# Patient Record
Sex: Male | Born: 1960
Health system: Southern US, Community
[De-identification: ages and names within clinical notes are randomized; demographics above are authoritative.]

## PROBLEM LIST (undated history)

## (undated) DIAGNOSIS — G4730 Sleep apnea, unspecified: Secondary | ICD-10-CM

## (undated) DIAGNOSIS — F32A Depression, unspecified: Secondary | ICD-10-CM

## (undated) DIAGNOSIS — Z973 Presence of spectacles and contact lenses: Secondary | ICD-10-CM

## (undated) DIAGNOSIS — B955 Unspecified streptococcus as the cause of diseases classified elsewhere: Secondary | ICD-10-CM

## (undated) DIAGNOSIS — I10 Essential (primary) hypertension: Secondary | ICD-10-CM

## (undated) DIAGNOSIS — K219 Gastro-esophageal reflux disease without esophagitis: Secondary | ICD-10-CM

## (undated) DIAGNOSIS — T7840XA Allergy, unspecified, initial encounter: Secondary | ICD-10-CM

## (undated) DIAGNOSIS — E119 Type 2 diabetes mellitus without complications: Secondary | ICD-10-CM

## (undated) DIAGNOSIS — J45909 Unspecified asthma, uncomplicated: Secondary | ICD-10-CM

## (undated) DIAGNOSIS — IMO0002 Reserved for concepts with insufficient information to code with codable children: Secondary | ICD-10-CM

## (undated) DIAGNOSIS — E785 Hyperlipidemia, unspecified: Secondary | ICD-10-CM

## (undated) DIAGNOSIS — F329 Major depressive disorder, single episode, unspecified: Secondary | ICD-10-CM

## (undated) DIAGNOSIS — L0390 Cellulitis, unspecified: Secondary | ICD-10-CM

## (undated) HISTORY — PX: HERNIA REPAIR: SHX51

## (undated) HISTORY — PX: COLONOSCOPY: SHX174

## (undated) HISTORY — PX: APPENDECTOMY: SHX54

## (undated) HISTORY — PX: VASECTOMY: SHX75

## (undated) HISTORY — PX: ANKLE SURGERY: SHX546

## (undated) HISTORY — PX: SPINE SURGERY: SHX786

## (undated) HISTORY — DX: Unspecified asthma, uncomplicated: J45.909

## (undated) HISTORY — DX: Allergy, unspecified, initial encounter: T78.40XA

---

## 2004-04-15 ENCOUNTER — Ambulatory Visit: Payer: Self-pay | Admitting: Internal Medicine

## 2005-04-23 ENCOUNTER — Ambulatory Visit: Payer: Self-pay | Admitting: Internal Medicine

## 2005-08-14 ENCOUNTER — Ambulatory Visit: Payer: Self-pay | Admitting: Internal Medicine

## 2005-08-19 ENCOUNTER — Ambulatory Visit: Payer: Self-pay | Admitting: Internal Medicine

## 2006-04-01 ENCOUNTER — Encounter: Payer: Self-pay | Admitting: Orthopedic Surgery

## 2006-04-01 ENCOUNTER — Ambulatory Visit: Payer: Self-pay | Admitting: Orthopedic Surgery

## 2006-04-15 ENCOUNTER — Encounter: Payer: Self-pay | Admitting: Orthopedic Surgery

## 2006-05-11 ENCOUNTER — Ambulatory Visit (HOSPITAL_BASED_OUTPATIENT_CLINIC_OR_DEPARTMENT_OTHER): Admission: RE | Admit: 2006-05-11 | Discharge: 2006-05-11 | Payer: Self-pay | Admitting: Orthopedic Surgery

## 2006-05-15 ENCOUNTER — Encounter: Payer: Self-pay | Admitting: Orthopedic Surgery

## 2006-06-15 ENCOUNTER — Encounter: Payer: Self-pay | Admitting: Orthopedic Surgery

## 2006-07-16 ENCOUNTER — Encounter: Payer: Self-pay | Admitting: Orthopedic Surgery

## 2007-07-03 ENCOUNTER — Ambulatory Visit: Payer: Self-pay | Admitting: Internal Medicine

## 2007-07-18 ENCOUNTER — Ambulatory Visit: Payer: Self-pay | Admitting: Internal Medicine

## 2008-11-30 ENCOUNTER — Emergency Department (HOSPITAL_COMMUNITY): Admission: EM | Admit: 2008-11-30 | Discharge: 2008-11-30 | Payer: Self-pay | Admitting: Emergency Medicine

## 2010-09-22 LAB — COMPREHENSIVE METABOLIC PANEL
ALT: 38 U/L (ref 0–53)
AST: 37 U/L (ref 0–37)
Albumin: 3.9 g/dL (ref 3.5–5.2)
Alkaline Phosphatase: 68 U/L (ref 39–117)
BUN: 10 mg/dL (ref 6–23)
Chloride: 106 mEq/L (ref 96–112)
GFR calc Af Amer: >60 mL/min (ref >60)
Potassium: 3.8 mEq/L (ref 3.5–5.1)
Sodium: 139 mEq/L (ref 135–145)
Total Bilirubin: 0.6 mg/dL (ref 0.3–1.2)

## 2010-09-22 LAB — URINALYSIS, ROUTINE W REFLEX MICROSCOPIC
Bilirubin Urine: NEGATIVE
Glucose, UA: NEGATIVE mg/dL
Hgb urine dipstick: NEGATIVE
Protein, ur: NEGATIVE mg/dL
Urobilinogen, UA: 0.2 mg/dL (ref 0.0–1.0)

## 2010-09-22 LAB — DIFFERENTIAL
Basophils Absolute: 0 10*3/uL (ref 0.0–0.1)
Basophils Relative: 1 % (ref 0–1)
Eosinophils Relative: 2 % (ref 0–5)
Monocytes Absolute: 0.5 10*3/uL (ref 0.1–1.0)
Monocytes Relative: 9 % (ref 3–12)

## 2010-09-22 LAB — CBC
HCT: 44 % (ref 39.0–52.0)
Platelets: 150 10*3/uL (ref 150–400)
WBC: 6.2 10*3/uL (ref 4.0–10.5)

## 2010-10-31 NOTE — Op Note (Signed)
Jeffrey [name], Jeffrey [name]               ACCOUNT NO.:  [id number]   MEDICAL RECORD NO.:  [id number]          PATIENT TYPE:  AMB   LOCATION:  DSC                          FACILITY:  [facility]   PHYSICIAN:  Leonides Grills, M.D.     DATE OF BIRTH:  11-15-1960   DATE OF PROCEDURE:  05/11/2006  DATE OF DISCHARGE:                               OPERATIVE REPORT   PREOPERATIVE DIAGNOSES:  1. Left medial talar dome osteochondral lesion.  2. Left anterior ankle impingement.   POSTOPERATIVE DIAGNOSES:  1. Left medial talar dome osteochondral lesion.  2. Left anterior ankle impingement.   OPERATION:  1. Left debridement drilling medial talar dome osteochondral lesion.  2. Left ankle arthroscopy with extensive debridement.   ANESTHESIA:  General.   SURGEON:  Leonides Grills, M.D.   ASSISTANT:  Evlyn Kanner, P.A.-C.   ESTIMATED BLOOD LOSS:  Minimal.   TOURNIQUET TIME:  Approximately an hour.   COMPLICATIONS:  None.   DISPOSITION:  Stable to PR.   INDICATIONS:  This is a 50 year old male who has had longstanding  anterior ankle pain after an ankle sprain that was interfering with  his life.  It has plateaued and has actually gotten worse to the point  he could not do the things he enjoys to do.  He was consented to the  above procedure.  All risks which include infection, nerve or vessel  injury, persistent pain, worse pain, stiffness, arthritis, sinus  formation were all explained.  Questions encouraged and answered.   DESCRIPTION OF PROCEDURE:  The patient was brought to the operating room  and placed in the supine position initially.  After adequate general  endotracheal anesthesia was administered as well as block as well as  Ancef 1 gram IV piggyback, the patient was then placed a sloppy lateral  position with the opposite side up on a beanbag.  All bony prominences  were well padded.  The left lower extremity was prepped and draped in a  sterile manner over a proximally placed thigh  tourniquet.  Limb was  gravity saline.  Tourniquet was elevated to 290 mmHg.  Anatomical  landmarks to include anterior tibialis tendon and peroneus tertius were  then mapped out.  Superficial peroneal nerve could not be seen.  Spinal  needle was then placed in the ankle and 20 mL normal saline was  instilled in the ankle.  Jeffrey [name] and spread technique was then utilized to  create the anteromedial portal medial through the tibialis tendon.  Blunt trocar with cannula followed by camera was placed into the ankle  under direct visualization anterolateral portals created just lateral to  peroneus tertius tendon illuminating the skin to make sure that the  superficial peroneal nerve was not injured and again this was done with  the nick and spread technique.  There was a tremendous amount of scar  tissue and synovitis over the entire anterior aspect of the ankle with  the anterior distal tibial spur as well.  An extensive debridement was  then performed with radiofrequency bevel 2.9 mm shaver as well as a 2.9  mm bur.  This included the accessory tib-fib ligament that was impinging  the anterolateral corner of the talar dome with local synovitis extended  into the anterolateral gutter.  At the anteromedial aspect of the ankle,  there was a large spur off the anterior aspect of the medial malleolus.  There was also a cartilage lesion involving the tibial plafond shoulder  region as well as the shoulder region of the medial aspect of the talar  dome.  There was a small lesion measuring approximately 3-4 mm.  Both  the tibial and talar surfaces were debrided with a shaver as well as a  curette down to bone and microfracture drilling technique was then  utilized to the left base of the lesion.  The tourniquet was deflated as  well as inflow in this leg beautifully.  Hemostasis was obtained.  Pictures were obtained throughout the procedure.  Wounds were closed  with 4-0 nylon suture.  Sterile dressing  was applied. Cam Walker boot  was applied.  The patient was stable to PR.      Leonides Grills, M.D.  Electronically Signed     PB/MEDQ  D:  05/11/2006  T:  05/11/2006  Job:  [id number]

## 2011-07-17 ENCOUNTER — Encounter (HOSPITAL_COMMUNITY): Payer: Self-pay | Admitting: Pharmacy Technician

## 2011-07-21 NOTE — H&P (Signed)
Jeffrey [name] DOB: 11-11-1960  Chief Complaint: Back pain  History of Present Illness The patient is a 51 year old male who is scheduled for a hemilaminectomy with microdiscectomy L5-S1, right with Jeffrey [name] on Thursday Jul 23, 2011 at [facility].  patient reports low back symptoms including low back pain (L5-S1) and tingling which began 3 weeks ago. He has had pain off and on for a while. Symptoms include pain, pain in the calf and pain with coughing/sneezing, while symptoms do not include weakness, incontinence of stool or incontinence of urine. The pain radiates to the right posterior thigh to the lateral popliteal down the lateral calf to the ankle. The patient describes the severity of their symptoms as moderate in severity. The patient feels as if the symptoms are worsening. He has been treated by a chiropractor Dr Alfredo Bach in [place]. MRI revealed disc herniation at L5-S1 on the right.   Allergies PENICILLIN. 03/25/2006  Past Medical History Back pain Depression Gastroesophageal Reflux Disease High blood pressure Migraine Headache Sleep Apnea  Family History Hypertension. father Osteoarthritis. mother   Social History Alcohol use. current drinker; drinks beer; only occasionally per week Children. 2 Current work status. working full time Drug/Alcohol Rehab (Currently). no Drug/Alcohol Rehab (Previously). no Exercise. Exercises weekly; does other Illicit drug use. no Living situation. live with spouse Marital status. married Number of flights of stairs before winded. 2-3 Pain Contract. no Tobacco / smoke exposure. yes outdoors only Tobacco use. current every day smoker; smoke(d) 3/4 pack(s) per day   Medication History Aspirin (81MG  Tablet, 1 Oral) Active. Omeprazole (20MG  Capsule ER, Oral) Active. Venlafaxine HCl (50MG  Tablet, Oral) Active. Lisinopril-Hydrochlorothiazide (20-12.5MG  Tablet, Oral) Active.   Past Surgical  History Appendectomy Other Orthopaedic Surgery   Review of Systems General:Present- Fatigue. Not Present- Chills, Fever, Night Sweats, Appetite Loss, Feeling sick, Weight Gain and Weight Loss. Skin:Not Present- Itching, Rash, Skin Color Changes, Ulcer, Psoriasis and Change in Hair or Nails. HEENT:Present- Ringing in the Ears. Not Present- Sensitivity to light, Hearing problems and Nose Bleed. Neck:Not Present- Swollen Glands and Neck Mass. Respiratory:Present- Snoring. Not Present- Chronic Cough, Bloody sputum and Dyspnea. Cardiovascular:Present- Leg Cramps. Not Present- Shortness of Breath, Chest Pain, Swelling of Extremities and Palpitations. Gastrointestinal:Present- Heartburn. Not Present- Bloody Stool, Abdominal Pain, Vomiting, Nausea and Incontinence of Stool. Male Genitourinary:Not Present- Blood in Urine, Frequency, Incontinence and Nocturia. Musculoskeletal:Present- Back Pain. Not Present- Muscle Weakness, Muscle Pain, Joint Stiffness, Joint Swelling and Joint Pain. Neurological:Present- Tingling, Numbness and Burning. Not Present- Tremor, Headaches and Dizziness. Endocrine:Not Present- Cold Intolerance, Heat Intolerance, Excessive hunger and Excessive Thirst. Hematology:Not Present- Abnormal Bleeding, Anemia, Blood Clots and Easy Bruising.   Vitals 07/04/2011 10:46 AM Weight: 292.38 lb Height: 70 in Body Surface Area: 2.56 m Body Mass Index: 41.95 kg/m BP: 140/95 (Sitting, Left Arm, Standard)   Physical Exam Physical exam of his back is painful in all ranges. Hyperextension reproduces his symptoms down his right lower extremity. He has no gross spasms in his back. He's very painful with back motion. In the supine position and the sitting position, he has a strongly positive SLR test on the right. He has a good dorsalis pedis pulse. His calf is soft and nontender. No signs of any DVT. His left hip and left knee are normal. He has definite  weaknessof his dorsiflexors and toe extensors of the right foot compared to the left. The sensory exam is intact. Heart sounds normal. No murmurs. Lungs clear to auscultation. Abdomen soft and nontender. Bowel  sounds active. Neuro grossly intact.    RADIOGRAPHS: X-rays of the lumbar spine show some narrowing of L5-S1.  Assessment & Plan Lumbar disc herniation L5-S1 Hemilaminectomy with microdiscectomy L5-S1 right The possible complications of spinal surgery number one could be infection, which is extremely rare. We do use antibiotics prior to the surgery and during surgery and after surgery. Number two is always a slight degree of probability that you could develop a blood clot in your leg after any type of surgery and we try our best to prevent that with aspirin post op when it is safe to begin. The third is a dural leak. That is the spinal fluid leak that could occur. At certain rare times the bone or the disc could literally stick to the dura which is the lining which contains the spinal fluid and we could develop a small tear in that lining which we then patch up. That is an extremely rare complication. The last and final complication is a recurrent disc rupture. That means that you could rupture another small piece of disc later on down the road and there is about a 2% chance of that.   Dimitri Ped, PA-C

## 2011-07-22 ENCOUNTER — Encounter (HOSPITAL_COMMUNITY): Payer: Self-pay

## 2011-07-22 ENCOUNTER — Encounter (HOSPITAL_COMMUNITY)
Admission: RE | Admit: 2011-07-22 | Discharge: 2011-07-22 | Disposition: A | Discharge status: Home / Self Care | Payer: BC Managed Care – PPO | Source: Ambulatory Visit | Attending: Orthopedic Surgery | Admitting: Orthopedic Surgery

## 2011-07-22 ENCOUNTER — Ambulatory Visit (HOSPITAL_COMMUNITY)
Admission: RE | Admit: 2011-07-22 | Discharge: 2011-07-22 | Disposition: A | Discharge status: Home / Self Care | Payer: BC Managed Care – PPO | Source: Ambulatory Visit | Attending: Surgical | Admitting: Surgical

## 2011-07-22 ENCOUNTER — Ambulatory Visit (HOSPITAL_COMMUNITY): Admission: RE | Admit: 2011-07-22 | Payer: BC Managed Care – PPO | Source: Ambulatory Visit

## 2011-07-22 ENCOUNTER — Ambulatory Visit (HOSPITAL_COMMUNITY)
Admission: RE | Admit: 2011-07-22 | Discharge: 2011-07-22 | Disposition: A | Discharge status: Home / Self Care | Payer: BC Managed Care – PPO | Source: Ambulatory Visit | Attending: Orthopedic Surgery | Admitting: Orthopedic Surgery

## 2011-07-22 ENCOUNTER — Other Ambulatory Visit: Payer: Self-pay

## 2011-07-22 HISTORY — DX: Essential (primary) hypertension: I10

## 2011-07-22 HISTORY — DX: Sleep apnea, unspecified: G47.30

## 2011-07-22 HISTORY — DX: Depression, unspecified: F32.A

## 2011-07-22 HISTORY — DX: Reserved for concepts with insufficient information to code with codable children: IMO0002

## 2011-07-22 HISTORY — DX: Major depressive disorder, single episode, unspecified: F32.9

## 2011-07-22 HISTORY — DX: Gastro-esophageal reflux disease without esophagitis: K21.9

## 2011-07-22 LAB — DIFFERENTIAL
Basophils Absolute: 0 10*3/uL (ref 0.0–0.1)
Basophils Relative: 0 % (ref 0–1)
Eosinophils Absolute: 0.1 10*3/uL (ref 0.0–0.7)
Eosinophils Relative: 1 % (ref 0–5)
Lymphocytes Relative: 21 % (ref 12–46)
Lymphs Abs: 2.3 10*3/uL (ref 0.7–4.0)
Monocytes Absolute: 1 10*3/uL (ref 0.1–1.0)
Monocytes Relative: 9 % (ref 3–12)
Neutro Abs: 7.7 10*3/uL (ref 1.7–7.7)
Neutrophils Relative %: 70 % (ref 43–77)

## 2011-07-22 LAB — TYPE AND SCREEN
ABO/RH(D): A NEG
Antibody Screen: NEGATIVE

## 2011-07-22 LAB — SURGICAL PCR SCREEN: Staphylococcus aureus: NEGATIVE

## 2011-07-22 LAB — COMPREHENSIVE METABOLIC PANEL
ALT: 29 U/L (ref 0–53)
AST: 13 U/L (ref 0–37)
Albumin: 4.2 g/dL (ref 3.5–5.2)
Alkaline Phosphatase: 70 U/L (ref 39–117)
BUN: 13 mg/dL (ref 6–23)
CO2: 30 mEq/L (ref 19–32)
Calcium: 10.2 mg/dL (ref 8.4–10.5)
Chloride: 98 mEq/L (ref 96–112)
Creatinine, Ser: 0.99 mg/dL (ref 0.50–1.35)
GFR calc Af Amer: >90 mL/min (ref >90)
GFR calc non Af Amer: >90 mL/min (ref >90)
Glucose, Bld: 132 mg/dL — ABNORMAL HIGH (ref 70–99)
Potassium: 4.1 mEq/L (ref 3.5–5.1)
Sodium: 137 mEq/L (ref 135–145)
Total Bilirubin: 0.3 mg/dL (ref 0.3–1.2)
Total Protein: 7.4 g/dL (ref 6.0–8.3)

## 2011-07-22 LAB — CBC
HCT: 45.5 % (ref 39.0–52.0)
Hemoglobin: 16 g/dL (ref 13.0–17.0)
MCH: 30.8 pg (ref 26.0–34.0)
MCHC: 35.2 g/dL (ref 30.0–36.0)
MCV: 87.7 fL (ref 78.0–100.0)
Platelets: 205 10*3/uL (ref 150–400)
RBC: 5.19 MIL/uL (ref 4.22–5.81)
RDW: 13.8 % (ref 11.5–15.5)
WBC: 11 10*3/uL — ABNORMAL HIGH (ref 4.0–10.5)

## 2011-07-22 LAB — ABO/RH: ABO/RH(D): A NEG

## 2011-07-22 LAB — URINALYSIS, ROUTINE W REFLEX MICROSCOPIC
Bilirubin Urine: NEGATIVE
Glucose, UA: 250 mg/dL — AB
Hgb urine dipstick: NEGATIVE
Ketones, ur: NEGATIVE mg/dL
Leukocytes, UA: NEGATIVE
Nitrite: NEGATIVE
Protein, ur: NEGATIVE mg/dL
Specific Gravity, Urine: 1.025 (ref 1.005–1.030)
Urobilinogen, UA: 0.2 mg/dL (ref 0.0–1.0)
pH: 6 (ref 5.0–8.0)

## 2011-07-22 LAB — PROTIME-INR
INR: 1 (ref 0.00–1.49)
Prothrombin Time: 13.4 seconds (ref 11.6–15.2)

## 2011-07-22 LAB — APTT: aPTT: 28 seconds (ref 24–37)

## 2011-07-22 NOTE — Patient Instructions (Addendum)
20 Jeffrey [name]  07/22/2011   Your procedure is scheduled on:  07/23/11  Report to [facility] at 2:30 AM.  Call this number if you have problems the morning of surgery: [phone]   Remember:   Do not eat food:After Midnight.  May have clear liquids: up to 4 Hrs. before arrival.( 6 HRS BEFORE SURGERY) (10:45AM)  Clear liquids include soda, tea, black coffee, apple or grape juice, broth.  Take these medicines the morning of surgery with A SIP OF WATER: LORCET / ROBAXIN / PRILOSEC / DELTASONE / EFFEXOR   Do not wear jewelry, make-up or nail polish.  Do not wear lotions, powders, or perfumes.   Do not shave underarms or legs 48 hours prior to surgery (men may shave face)  Do not bring valuables to the hospital.  Contacts, dentures or bridgework may not be worn into surgery.  Leave suitcase in the car. After surgery it may be brought to your room.  For patients admitted to the hospital, checkout time is 11:00 AM the day of discharge.   Patients discharged the day of surgery will not be allowed to drive home.  Name and phone number of your driver:      Special Instructions: CHG Shower Use Special Wash: 1/2 bottle night before surgery and 1/2 bottle morning of surgery.   Please read over the following fact sheets that you were given: MRSA Information    BRING C -PAP MASK

## 2011-07-23 ENCOUNTER — Other Ambulatory Visit: Payer: Self-pay | Admitting: Orthopedic Surgery

## 2011-07-23 ENCOUNTER — Encounter (HOSPITAL_COMMUNITY): Payer: Self-pay | Admitting: Anesthesiology

## 2011-07-23 ENCOUNTER — Encounter (HOSPITAL_COMMUNITY): Payer: Self-pay | Admitting: *Deleted

## 2011-07-23 ENCOUNTER — Observation Stay (HOSPITAL_COMMUNITY)
Admission: RE | Admit: 2011-07-23 | Discharge: 2011-07-24 | DRG: 758 | Disposition: A | Discharge status: Home / Self Care | Payer: BC Managed Care – PPO | Source: Ambulatory Visit | Attending: Orthopedic Surgery | Admitting: Orthopedic Surgery

## 2011-07-23 ENCOUNTER — Encounter (HOSPITAL_COMMUNITY)
Admission: RE | Disposition: A | Discharge status: Home / Self Care | Payer: Self-pay | Source: Ambulatory Visit | Attending: Orthopedic Surgery

## 2011-07-23 ENCOUNTER — Ambulatory Visit (HOSPITAL_COMMUNITY): Payer: BC Managed Care – PPO

## 2011-07-23 ENCOUNTER — Ambulatory Visit (HOSPITAL_COMMUNITY): Payer: BC Managed Care – PPO | Admitting: Anesthesiology

## 2011-07-23 DIAGNOSIS — F329 Major depressive disorder, single episode, unspecified: Secondary | ICD-10-CM | POA: Diagnosis present

## 2011-07-23 DIAGNOSIS — G4730 Sleep apnea, unspecified: Secondary | ICD-10-CM | POA: Diagnosis present

## 2011-07-23 DIAGNOSIS — R209 Unspecified disturbances of skin sensation: Secondary | ICD-10-CM | POA: Diagnosis present

## 2011-07-23 DIAGNOSIS — F17200 Nicotine dependence, unspecified, uncomplicated: Secondary | ICD-10-CM | POA: Diagnosis present

## 2011-07-23 DIAGNOSIS — K219 Gastro-esophageal reflux disease without esophagitis: Secondary | ICD-10-CM | POA: Diagnosis present

## 2011-07-23 DIAGNOSIS — M48061 Spinal stenosis, lumbar region without neurogenic claudication: Secondary | ICD-10-CM | POA: Diagnosis present

## 2011-07-23 DIAGNOSIS — F3289 Other specified depressive episodes: Secondary | ICD-10-CM | POA: Diagnosis present

## 2011-07-23 DIAGNOSIS — Z01812 Encounter for preprocedural laboratory examination: Secondary | ICD-10-CM | POA: Insufficient documentation

## 2011-07-23 DIAGNOSIS — I10 Essential (primary) hypertension: Secondary | ICD-10-CM | POA: Diagnosis present

## 2011-07-23 DIAGNOSIS — Z01810 Encounter for preprocedural cardiovascular examination: Secondary | ICD-10-CM | POA: Insufficient documentation

## 2011-07-23 DIAGNOSIS — M5126 Other intervertebral disc displacement, lumbar region: Principal | ICD-10-CM | POA: Diagnosis present

## 2011-07-23 HISTORY — PX: LUMBAR LAMINECTOMY/DECOMPRESSION MICRODISCECTOMY: SHX5026

## 2011-07-23 SURGERY — HEMI-MICRODISCECTOMY LUMBAR LAMINECTOMY LEVEL 1
Anesthesia: General | Site: Back | Laterality: Right | Wound class: Clean

## 2011-07-23 MED ORDER — CLINDAMYCIN PHOSPHATE 600 MG/50ML IV SOLN
600.0000 mg | INTRAVENOUS | Status: AC
  Administered 2011-07-23: 600 mg via INTRAVENOUS

## 2011-07-23 MED ORDER — PROMETHAZINE HCL 25 MG/ML IJ SOLN: 6.2500 mg | INTRAMUSCULAR | Status: DC | PRN

## 2011-07-23 MED ORDER — CISATRACURIUM BESYLATE 2 MG/ML IV SOLN
INTRAVENOUS | Status: DC | PRN
  Administered 2011-07-23: 2 mg via INTRAVENOUS
  Administered 2011-07-23: 14 mg via INTRAVENOUS
  Administered 2011-07-23: 2 mg via INTRAVENOUS

## 2011-07-23 MED ORDER — HYDROCHLOROTHIAZIDE 12.5 MG PO CAPS
12.5000 mg | ORAL_CAPSULE | Freq: Every day | ORAL | Status: DC
  Administered 2011-07-24: 12.5 mg via ORAL
  Filled 2011-07-23: qty 1

## 2011-07-23 MED ORDER — FENTANYL CITRATE 0.05 MG/ML IJ SOLN
INTRAMUSCULAR | Status: DC | PRN
  Administered 2011-07-23: 50 ug via INTRAVENOUS
  Administered 2011-07-23: 100 ug via INTRAVENOUS
  Administered 2011-07-23: 25 ug via INTRAVENOUS
  Administered 2011-07-23 (×6): 50 ug via INTRAVENOUS

## 2011-07-23 MED ORDER — BUPIVACAINE LIPOSOME 1.3 % IJ SUSP
INTRAMUSCULAR | Status: DC | PRN
  Administered 2011-07-23: 20 mL

## 2011-07-23 MED ORDER — THROMBIN 5000 UNITS EX SOLR
CUTANEOUS | Status: DC | PRN
  Administered 2011-07-23: 5000 [IU] via TOPICAL

## 2011-07-23 MED ORDER — LACTATED RINGERS IV SOLN: INTRAVENOUS | Status: DC

## 2011-07-23 MED ORDER — GLYCOPYRROLATE 0.2 MG/ML IJ SOLN
INTRAMUSCULAR | Status: DC | PRN
  Administered 2011-07-23: .6 mg via INTRAVENOUS

## 2011-07-23 MED ORDER — DROPERIDOL 2.5 MG/ML IJ SOLN
INTRAMUSCULAR | Status: DC | PRN
  Administered 2011-07-23: .5 mg via INTRAVENOUS

## 2011-07-23 MED ORDER — PHENOL 1.4 % MT LIQD
1.0000 | OROMUCOSAL | Status: DC | PRN
  Filled 2011-07-23: qty 177

## 2011-07-23 MED ORDER — HYDROMORPHONE HCL PF 1 MG/ML IJ SOLN
0.5000 mg | INTRAMUSCULAR | Status: DC | PRN
  Administered 2011-07-23: 0.5 mg via INTRAVENOUS
  Filled 2011-07-23: qty 1

## 2011-07-23 MED ORDER — METHOCARBAMOL 100 MG/ML IJ SOLN
500.0000 mg | Freq: Four times a day (QID) | INTRAVENOUS | Status: DC | PRN
  Administered 2011-07-23: 500 mg via INTRAVENOUS
  Filled 2011-07-23 (×2): qty 5

## 2011-07-23 MED ORDER — ONDANSETRON HCL 4 MG/2ML IJ SOLN
INTRAMUSCULAR | Status: DC | PRN
  Administered 2011-07-23 (×2): 2 mg via INTRAVENOUS

## 2011-07-23 MED ORDER — OXYCODONE-ACETAMINOPHEN 5-325 MG PO TABS
1.0000 | ORAL_TABLET | ORAL | Status: DC | PRN
  Administered 2011-07-24 (×3): 2 via ORAL
  Filled 2011-07-23 (×3): qty 2

## 2011-07-23 MED ORDER — HYDROMORPHONE HCL PF 1 MG/ML IJ SOLN: 0.2500 mg | INTRAMUSCULAR | Status: DC | PRN

## 2011-07-23 MED ORDER — ACETAMINOPHEN 650 MG RE SUPP: 650.0000 mg | RECTAL | Status: DC | PRN

## 2011-07-23 MED ORDER — THROMBIN 5000 UNITS EX SOLR
OROMUCOSAL | Status: DC | PRN
  Administered 2011-07-23: 17:00:00 via TOPICAL

## 2011-07-23 MED ORDER — NEOSTIGMINE METHYLSULFATE 1 MG/ML IJ SOLN
INTRAMUSCULAR | Status: DC | PRN
  Administered 2011-07-23: 5 mg via INTRAVENOUS

## 2011-07-23 MED ORDER — KETAMINE HCL 10 MG/ML IJ SOLN
INTRAMUSCULAR | Status: DC | PRN
  Administered 2011-07-23: 30 mg via INTRAVENOUS

## 2011-07-23 MED ORDER — METHOCARBAMOL 500 MG PO TABS
500.0000 mg | ORAL_TABLET | Freq: Four times a day (QID) | ORAL | Status: DC | PRN
  Administered 2011-07-24: 500 mg via ORAL
  Filled 2011-07-23: qty 1

## 2011-07-23 MED ORDER — CLINDAMYCIN PHOSPHATE 600 MG/50ML IV SOLN
600.0000 mg | Freq: Three times a day (TID) | INTRAVENOUS | Status: AC
  Administered 2011-07-23 – 2011-07-24 (×3): 600 mg via INTRAVENOUS
  Filled 2011-07-23 (×3): qty 50

## 2011-07-23 MED ORDER — SODIUM CHLORIDE 0.9 % IR SOLN
Status: DC | PRN
  Administered 2011-07-23: 17:00:00

## 2011-07-23 MED ORDER — VENLAFAXINE HCL 50 MG PO TABS
50.0000 mg | ORAL_TABLET | Freq: Every day | ORAL | Status: DC
  Administered 2011-07-24: 50 mg via ORAL
  Filled 2011-07-23: qty 1

## 2011-07-23 MED ORDER — PROPOFOL 10 MG/ML IV EMUL
INTRAVENOUS | Status: DC | PRN
  Administered 2011-07-23: 300 mg via INTRAVENOUS

## 2011-07-23 MED ORDER — HYDROMORPHONE HCL PF 1 MG/ML IJ SOLN
INTRAMUSCULAR | Status: DC | PRN
  Administered 2011-07-23: 1 mg via INTRAVENOUS
  Administered 2011-07-23 (×2): 0.5 mg via INTRAVENOUS

## 2011-07-23 MED ORDER — ACETAMINOPHEN 10 MG/ML IV SOLN
INTRAVENOUS | Status: DC | PRN
  Administered 2011-07-23: 1000 mg via INTRAVENOUS

## 2011-07-23 MED ORDER — LIDOCAINE HCL (CARDIAC) 20 MG/ML IV SOLN
INTRAVENOUS | Status: DC | PRN
  Administered 2011-07-23: 20 mg via INTRAVENOUS

## 2011-07-23 MED ORDER — MEPERIDINE HCL 50 MG/ML IJ SOLN: 6.2500 mg | INTRAMUSCULAR | Status: DC | PRN

## 2011-07-23 MED ORDER — LISINOPRIL 20 MG PO TABS
20.0000 mg | ORAL_TABLET | Freq: Every day | ORAL | Status: DC
  Administered 2011-07-24: 20 mg via ORAL
  Filled 2011-07-23: qty 1

## 2011-07-23 MED ORDER — LACTATED RINGERS IV SOLN
INTRAVENOUS | Status: DC
  Administered 2011-07-23: 22:00:00 via INTRAVENOUS

## 2011-07-23 MED ORDER — ONDANSETRON HCL 4 MG/2ML IJ SOLN: 4.0000 mg | INTRAMUSCULAR | Status: DC | PRN

## 2011-07-23 MED ORDER — PANTOPRAZOLE SODIUM 40 MG PO TBEC
40.0000 mg | DELAYED_RELEASE_TABLET | Freq: Every day | ORAL | Status: DC
  Administered 2011-07-24: 40 mg via ORAL
  Filled 2011-07-23: qty 1

## 2011-07-23 MED ORDER — HYDROCODONE-ACETAMINOPHEN 5-325 MG PO TABS: 1.0000 | ORAL_TABLET | ORAL | Status: DC | PRN

## 2011-07-23 MED ORDER — LACTATED RINGERS IV SOLN
INTRAVENOUS | Status: DC | PRN
  Administered 2011-07-23 (×3): via INTRAVENOUS

## 2011-07-23 MED ORDER — MIDAZOLAM HCL 5 MG/5ML IJ SOLN
INTRAMUSCULAR | Status: DC | PRN
  Administered 2011-07-23: 2 mg via INTRAVENOUS

## 2011-07-23 MED ORDER — TESTOSTERONE 50 MG/5GM (1%) TD GEL
5.0000 g | Freq: Every day | TRANSDERMAL | Status: DC
  Administered 2011-07-24: 5 g via TRANSDERMAL
  Filled 2011-07-23: qty 5

## 2011-07-23 MED ORDER — ACETAMINOPHEN 325 MG PO TABS: 650.0000 mg | ORAL_TABLET | ORAL | Status: DC | PRN

## 2011-07-23 MED ORDER — SUCCINYLCHOLINE CHLORIDE 20 MG/ML IJ SOLN
INTRAMUSCULAR | Status: DC | PRN
  Administered 2011-07-23: 150 mg via INTRAVENOUS

## 2011-07-23 MED ORDER — BUPIVACAINE LIPOSOME 1.3 % IJ SUSP
20.0000 mL | Freq: Once | INTRAMUSCULAR | Status: DC
  Filled 2011-07-23: qty 20

## 2011-07-23 MED ORDER — MENTHOL 3 MG MT LOZG
1.0000 | LOZENGE | OROMUCOSAL | Status: DC | PRN
  Filled 2011-07-23: qty 9

## 2011-07-23 MED ORDER — LISINOPRIL-HYDROCHLOROTHIAZIDE 20-12.5 MG PO TABS: 1.0000 | ORAL_TABLET | ORAL | Status: DC

## 2011-07-23 SURGICAL SUPPLY — 40 items
APL SKNCLS STERI-STRIP NONHPOA (GAUZE/BANDAGES/DRESSINGS) ×1
BAG SPEC THK2 15X12 ZIP CLS (MISCELLANEOUS) ×1
BAG ZIPLOCK 12X15 (MISCELLANEOUS) ×2 IMPLANT
BENZOIN TINCTURE PRP APPL 2/3 (GAUZE/BANDAGES/DRESSINGS) ×2 IMPLANT
CLEANER TIP ELECTROSURG 2X2 (MISCELLANEOUS) ×2 IMPLANT
CLOTH BEACON ORANGE TIMEOUT ST (SAFETY) ×2 IMPLANT
CONT SPECI 4OZ STER CLIK (MISCELLANEOUS) ×2 IMPLANT
DRAPE MICROSCOPE LEICA (MISCELLANEOUS) ×2 IMPLANT
DRAPE POUCH INSTRU U-SHP 10X18 (DRAPES) ×2 IMPLANT
DRAPE SURG 17X11 SM STRL (DRAPES) ×2 IMPLANT
DRSG ADAPTIC 3X8 NADH LF (GAUZE/BANDAGES/DRESSINGS) ×2 IMPLANT
DRSG PAD ABDOMINAL 8X10 ST (GAUZE/BANDAGES/DRESSINGS) ×8 IMPLANT
DURAPREP 26ML APPLICATOR (WOUND CARE) ×2 IMPLANT
ELECT REM PT RETURN 9FT ADLT (ELECTROSURGICAL) ×2
ELECTRODE REM PT RTRN 9FT ADLT (ELECTROSURGICAL) ×1 IMPLANT
GAUZE SPONGE 4X4 12PLY STRL LF (GAUZE/BANDAGES/DRESSINGS) ×2 IMPLANT
GLOVE BIOGEL PI IND STRL 8.5 (GLOVE) ×2 IMPLANT
GLOVE BIOGEL PI INDICATOR 8.5 (GLOVE) ×2
GLOVE ECLIPSE 8.0 STRL XLNG CF (GLOVE) ×6 IMPLANT
GOWN PREVENTION PLUS LG XLONG (DISPOSABLE) ×2 IMPLANT
GOWN STRL REIN XL XLG (GOWN DISPOSABLE) ×4 IMPLANT
KIT BASIN OR (CUSTOM PROCEDURE TRAY) ×2 IMPLANT
KIT POSITIONING SURG ANDREWS (MISCELLANEOUS) ×2 IMPLANT
MANIFOLD NEPTUNE II (INSTRUMENTS) ×2 IMPLANT
NS IRRIG 1000ML POUR BTL (IV SOLUTION) IMPLANT
PATTIES SURGICAL .5 X.5 (GAUZE/BANDAGES/DRESSINGS) IMPLANT
PATTIES SURGICAL .75X.75 (GAUZE/BANDAGES/DRESSINGS) IMPLANT
PATTIES SURGICAL 1X1 (DISPOSABLE) IMPLANT
POSITIONER SURGICAL ARM (MISCELLANEOUS) IMPLANT
SPONGE LAP 4X18 X RAY DECT (DISPOSABLE) ×8 IMPLANT
SPONGE SURGIFOAM ABS GEL 100 (HEMOSTASIS) ×2 IMPLANT
SUT VIC AB 0 CT1 27 (SUTURE) ×2
SUT VIC AB 0 CT1 27XBRD ANTBC (SUTURE) ×1 IMPLANT
SUT VIC AB 1 CT1 27 (SUTURE) ×4
SUT VIC AB 1 CT1 27XBRD ANTBC (SUTURE) ×2 IMPLANT
SUT VIC AB 2-0 CT1 27 (SUTURE)
SUT VIC AB 2-0 CT1 27XBRD (SUTURE) IMPLANT
TAPE CLOTH SURG 4X10 WHT LF (GAUZE/BANDAGES/DRESSINGS) ×2 IMPLANT
TOWEL OR 17X26 10 PK STRL BLUE (TOWEL DISPOSABLE) ×4 IMPLANT
TRAY LAMINECTOMY (CUSTOM PROCEDURE TRAY) ×2 IMPLANT

## 2011-07-23 NOTE — Anesthesia Postprocedure Evaluation (Signed)
  Anesthesia Post-op Note  Patient: Jeffrey [name]  Procedure(s) Performed:  MICRODISCECTOMY LUMBAR LAMINECTOMY - hemi laminectomy microdiscectomy L5-S1 Right   (xray) / decompression for severe stenosis  Patient Location: PACU  Anesthesia Type: General  Level of Consciousness: awake and alert   Airway and Oxygen Therapy: Patient Spontanous Breathing  Post-op Pain: mild  Post-op Assessment: Post-op Vital signs reviewed, Patient's Cardiovascular Status Stable, Respiratory Function Stable, Patent Airway and No signs of Nausea or vomiting  Post-op Vital Signs: stable  Complications: No apparent anesthesia complications

## 2011-07-23 NOTE — Transfer of Care (Signed)
Immediate Anesthesia Transfer of Care Note  Patient: Jeffrey [name]  Procedure(s) Performed:  MICRODISCECTOMY LUMBAR LAMINECTOMY - hemi laminectomy microdiscectomy L5-S1 Right   (xray) / decompression for severe stenosis  Patient Location: PACU  Anesthesia Type: General  Level of Consciousness: awake and sedated  Airway & Oxygen Therapy: Patient Spontanous Breathing and Patient connected to face mask oxygen  Post-op Assessment: Report given to PACU RN and Post -op Vital signs reviewed and stable  Post vital signs: Reviewed and stable  Complications: No apparent anesthesia complications

## 2011-07-23 NOTE — Anesthesia Preprocedure Evaluation (Signed)
Anesthesia Evaluation  Patient identified by MRN, date of birth, ID band Patient awake    Reviewed: Allergy & Precautions, H&P , NPO status , Patient's Chart, lab work & pertinent test results  Airway Mallampati: II TM Distance: >3 FB Neck ROM: Full    Dental No notable dental hx.    Pulmonary neg pulmonary ROS, sleep apnea and Continuous Positive Airway Pressure Ventilation ,  clear to auscultation  Pulmonary exam normal       Cardiovascular hypertension, Pt. on medications neg cardio ROS Regular Normal    Neuro/Psych Negative Neurological ROS  Negative Psych ROS   GI/Hepatic negative GI ROS, Neg liver ROS, GERD-  ,  Endo/Other  Negative Endocrine ROS  Renal/GU negative Renal ROS  Genitourinary negative   Musculoskeletal negative musculoskeletal ROS (+)   Abdominal   Peds negative pediatric ROS (+)  Hematology negative hematology ROS (+)   Anesthesia Other Findings   Reproductive/Obstetrics negative OB ROS                           Anesthesia Physical Anesthesia Plan  ASA: II  Anesthesia Plan: General   Post-op Pain Management:    Induction: Intravenous  Airway Management Planned: Oral ETT  Additional Equipment:   Intra-op Plan:   Post-operative Plan: Extubation in OR  Informed Consent: I have reviewed the patients History and Physical, chart, labs and discussed the procedure including the risks, benefits and alternatives for the proposed anesthesia with the patient or authorized representative who has indicated his/her understanding and acceptance.   Dental advisory given  Plan Discussed with: CRNA  Anesthesia Plan Comments:         Anesthesia Quick Evaluation

## 2011-07-23 NOTE — Brief Op Note (Signed)
07/23/2011  6:43 PM  PATIENT:  Jeffrey [name]  51 y.o. male  PRE-OPERATIVE DIAGNOSIS:  herniated disc L5-S1  POST-OPERATIVE DIAGNOSIS:  herniated disc L5-S1  PROCEDURE:  Procedure(s): MICRODISCECTOMY LUMBAR LAMINECTOMY  SURGEON:  Surgeon(s): Jacki Cones, MD Drucilla Schmidt, MD  PHYSICIAN ASSISTANT:   ASSISTANTS: Dr. Marlowe Kays MD   ANESTHESIA:   general  EBL:  Total I/O In: 1800 [I.V.:1800] Out: 150 [Blood:150]  BLOOD ADMINISTERED:none  DRAINS: none   LOCAL MEDICATIONS USED:  BUPIVICAINE 20CC  SPECIMEN:  Source of Specimen:  L-5--S-1  DISPOSITION OF SPECIMEN:  PATHOLOGY  COUNTS:  YES  TOURNIQUET:  * No tourniquets in log *  DICTATION: .Other Dictation: Dictation Number [id number]  PLAN OF CARE: Admit to inpatient   PATIENT DISPOSITION:  PACU - hemodynamically stable.   Delay start of Pharmacological VTE agent (>24hrs) due to surgical blood loss or risk of bleeding:  {YES/NO/NOT APPLICABLE:20182

## 2011-07-23 NOTE — Anesthesia Procedure Notes (Signed)
Procedure Name: Intubation Date/Time: 07/23/2011 4:35 PM Performed by: Valeda Malm Oxygen Delivery Method: Circle System Utilized Ventilation: Mask ventilation without difficulty

## 2011-07-23 NOTE — Interval H&P Note (Signed)
History and Physical Interval Note:  07/23/2011 3:55 PM  Jeffrey [name]  has presented today for surgery, with the diagnosis of herniated disc  The various methods of treatment have been discussed with the patient and family. After consideration of risks, benefits and other options for treatment, the patient has consented to  Procedure(s): MICRODISCECTOMY LUMBAR LAMINECTOMY as a surgical intervention .  The patients' history has been reviewed, patient examined, no change in status, stable for surgery.  I have reviewed the patients' chart and labs.  Questions were answered to the patient's satisfaction.     Myrick Mcnairy A

## 2011-07-24 MED ORDER — OXYCODONE-ACETAMINOPHEN 5-325 MG PO TABS: 1.0000 | ORAL_TABLET | ORAL | Status: AC | PRN

## 2011-07-24 MED ORDER — METHOCARBAMOL 500 MG PO TABS: 500.0000 mg | ORAL_TABLET | Freq: Four times a day (QID) | ORAL | Status: DC | PRN

## 2011-07-24 MED FILL — Neomycin-Bacitracin-Polymyxin Oint: CUTANEOUS | Qty: 15 | Status: AC

## 2011-07-24 NOTE — Discharge Summary (Signed)
Physician Discharge Summary   Patient ID: Jeffrey [name] MRN: [id number] DOB/AGE: 1960/11/06 50 y.o.  Admit date: 07/23/2011 Discharge date: 07/24/2011  Primary Diagnosis: Disc displacement, lumbar  Admission Diagnoses: Past Medical History  Diagnosis Date  . Hypertension   . Herniated disc   . GERD (gastroesophageal reflux disease)   . Sleep apnea uses c-pap  . Depression     Discharge Diagnoses:  Active Problems:  Disc displacement, lumbar  Stenosis, spinal, lumbar S/P microdiscectomy lumbar laminectomy, right  Procedure: Procedure(s) (LRB): MICRODISCECTOMY LUMBAR LAMINECTOMY (Right)   Consults: None  HPI: Jeffrey [name] reported low back symptoms including low back pain (L5-S1) and tingling which began 4 weeks ago. He had pain off and on for a while. Symptoms included pain, pain in the calf and pain with coughing/sneezing, while symptoms do not include weakness, incontinence of stool or incontinence of urine. The pain radiated to the right posterior thigh to the lateral popliteal down the lateral calf to the ankle.  The patient felt as if the symptoms are worsening. He had been treated by a chiropractor Dr Jeffrey [name] in [place]. MRI revealed disc herniation at L5-S1 on the right.   Laboratory Data: Hospital Outpatient Visit on 07/22/2011  Component Date Value Range Status  . MRSA, PCR  07/22/2011 NEGATIVE  NEGATIVE Final  . Staphylococcus aureus  07/22/2011 NEGATIVE  NEGATIVE Final   Comment:                                 The Xpert SA Assay (FDA                          approved for NASAL specimens                          only), is one component of                          a comprehensive surveillance                          program.  It is not intended                          to diagnose infection nor to                          guide or monitor treatment.  Marland Kitchen aPTT (seconds) 07/22/2011 28  24-37 Final  . WBC (K/uL) 07/22/2011 11.0* 4.0-10.5 Final  . RBC (MIL/uL)  07/22/2011 5.19  4.22-5.81 Final  . Hemoglobin (g/dL) 78/29/5621 30.8  65.7-84.6 Final  . HCT (%) 07/22/2011 45.5  39.0-52.0 Final  . MCV (fL) 07/22/2011 87.7  78.0-100.0 Final  . MCH (pg) 07/22/2011 30.8  26.0-34.0 Final  . MCHC (g/dL) 96/29/5284 13.2  44.0-10.2 Final  . RDW (%) 07/22/2011 13.8  11.5-15.5 Final  . Platelets (K/uL) 07/22/2011 205  150-400 Final  . Sodium (mEq/L) 07/22/2011 137  135-145 Final  . Potassium (mEq/L) 07/22/2011 4.1  3.5-5.1 Final  . Chloride (mEq/L) 07/22/2011 98  96-112 Final  . CO2 (mEq/L) 07/22/2011 30  19-32 Final  . Glucose, Bld (mg/dL) 72/53/6644 034* 74-25 Final  . BUN (mg/dL) 95/63/8756 13  4-33 Final  . Creatinine, Ser (mg/dL)  07/22/2011 0.99  0.50-1.35 Final  . Calcium (mg/dL) 16/03/9603 54.0  9.8-11.9 Final  . Total Protein (g/dL) 14/78/2956 7.4  2.1-3.0 Final  . Albumin (g/dL) 86/57/8469 4.2  6.2-9.5 Final  . AST (U/L) 07/22/2011 13  0-37 Final  . ALT (U/L) 07/22/2011 29  0-53 Final  . Alkaline Phosphatase (U/L) 07/22/2011 70  39-117 Final  . Total Bilirubin (mg/dL) 28/41/3244 0.3  0.1-0.2 Final  . GFR calc non Af Amer (mL/min) 07/22/2011 >90  >90 Final  . GFR calc Af Amer (mL/min) 07/22/2011 >90  >90 Final   Comment:                                 The eGFR has been calculated                          using the CKD EPI equation.                          This calculation has not been                          validated in all clinical                          situations.                          eGFR's persistently                          <90 mL/min signify                          possible Chronic Kidney Disease.  Marland Kitchen Neutrophils Relative (%) 07/22/2011 70  43-77 Final  . Neutro Abs (K/uL) 07/22/2011 7.7  1.7-7.7 Final  . Lymphocytes Relative (%) 07/22/2011 21  12-46 Final  . Lymphs Abs (K/uL) 07/22/2011 2.3  0.7-4.0 Final  . Monocytes Relative (%) 07/22/2011 9  3-12 Final  . Monocytes Absolute (K/uL) 07/22/2011 1.0  0.1-1.0 Final  .  Eosinophils Relative (%) 07/22/2011 1  0-5 Final  . Eosinophils Absolute (K/uL) 07/22/2011 0.1  0.0-0.7 Final  . Basophils Relative (%) 07/22/2011 0  0-1 Final  . Basophils Absolute (K/uL) 07/22/2011 0.0  0.0-0.1 Final  . Prothrombin Time (seconds) 07/22/2011 13.4  11.6-15.2 Final  . INR  07/22/2011 1.00  0.00-1.49 Final  . ABO/RH(D)  07/22/2011 A NEG   Final  . Antibody Screen  07/22/2011 NEG   Final  . Sample Expiration  07/22/2011 07/25/2011   Final  . Color, Urine  07/22/2011 YELLOW  YELLOW Final  . APPearance  07/22/2011 CLEAR  CLEAR Final  . Specific Gravity, Urine  07/22/2011 1.025  1.005-1.030 Final  . pH  07/22/2011 6.0  5.0-8.0 Final  . Glucose, UA (mg/dL) 72/53/6644 034* NEGATIVE Final  . Hgb urine dipstick  07/22/2011 NEGATIVE  NEGATIVE Final  . Bilirubin Urine  07/22/2011 NEGATIVE  NEGATIVE Final  . Ketones, ur (mg/dL) 74/25/9563 NEGATIVE  NEGATIVE Final  . Protein, ur (mg/dL) 87/56/4332 NEGATIVE  NEGATIVE Final  . Urobilinogen, UA (mg/dL) 95/18/8416 0.2  6.0-6.3 Final  . Nitrite  07/22/2011 NEGATIVE  NEGATIVE Final  . Leukocytes, UA  07/22/2011 NEGATIVE  NEGATIVE Final   MICROSCOPIC NOT DONE ON URINES WITH NEGATIVE PROTEIN, BLOOD, LEUKOCYTES, NITRITE, OR GLUCOSE <1000 mg/dL.  . ABO/RH(D)  07/22/2011 A NEG   Final    Basename 07/22/11 1010  HGB 16.0    Basename 07/22/11 1010  WBC 11.0*  RBC 5.19  HCT 45.5  PLT 205    Basename 07/22/11 1010  NA 137  K 4.1  CL 98  CO2 30  BUN 13  CREATININE 0.99  GLUCOSE 132*  CALCIUM 10.2    Basename 07/22/11 1010  LABPT --  INR 1.00    X-Rays:Dg Chest 2 View  07/22/2011  *RADIOLOGY REPORT*  Clinical Data: Preop microdiskectomy/laminectomy  CHEST - 2 VIEW  Comparison: 11/30/2008  Findings: Lungs are clear. No pleural effusion or pneumothorax.  Cardiomediastinal silhouette is within normal limits.  Mild degenerative changes of the visualized thoracolumbar spine.  IMPRESSION: No evidence of acute cardiopulmonary disease.   Original Report Authenticated By: Charline Bills, M.D.   Dg Lumbar Spine 2-3 Views  07/22/2011  *RADIOLOGY REPORT*  Clinical Data: Preop microdiskectomy/laminectomy  LUMBAR SPINE - 2-3 VIEW  Comparison: CT abdomen pelvis dated 11/30/2008  Findings: Five lumbar-type vertebral bodies.  Normal lumbar lordosis.  No evidence of fracture or dislocation.  Vertebral body heights are maintained.  Mild to moderate multilevel degenerative changes, most prominent at L4-5 and L5 S1.  IMPRESSION: No fracture or dislocation is seen.  Mild to moderate multilevel degenerative changes of the lower lumbar spine.  Original Report Authenticated By: Charline Bills, M.D.   Dg Spine Portable 1 View  07/23/2011  *RADIOLOGY REPORT*  Clinical Data: 51 year old male undergoing lumbar surgery.  PORTABLE SPINE - 1 VIEW  Comparison: 1705 hours the same day and earlier.  Findings: Film #3 at 1735 hours.  Two surgical probes project at the S1 level.  IMPRESSION: Intraoperative localization at S1.  Original Report Authenticated By: Harley Hallmark, M.D.   Dg Spine Portable 1 View  07/23/2011  *RADIOLOGY REPORT*  Clinical Data: Decompression L5-S1  PORTABLE SPINE - 1 VIEW  Comparison: 07/23/2011  Findings: Surgical instrument overlies the spinous process of L5. An instrument is directed between the spinous processes of L5 and S1, posterior to the lamina.  IMPRESSION: Surgical localization as above.  Original Report Authenticated By: Camelia Phenes, M.D.   Dg Spine Portable 1 View  07/23/2011  *RADIOLOGY REPORT*  Clinical Data: 51 year old male undergoing lumbar surgery.  PORTABLE SPINE - 1 VIEW  Comparison: Lumbar radiographs 07/22/2011.  Findings: Normal lumbar segmentation depicted on comparison.  Portable cross-table lateral intraoperative view of the lumbar spine labeled film #1 at 1650 hours.  Two needles are in place posteriorly.  The more cephalad needle projects at the L5-S1 disc level.  The more caudal needle projects at the S1-S2  level.  IMPRESSION: Intraoperative localization as above.  Original Report Authenticated By: Ulla Potash III, M.D.    EKG: Orders placed in visit on 07/22/11  . EKG 12-LEAD     Hospital Course: Patient was admitted to [facility] and taken to the OR and underwent the above state procedure without complications.  Patient tolerated the procedure well and was later transferred to the recovery room and then to the orthopaedic floor for postoperative care.  They were given PO and IV analgesics for pain control following their surgery.    PT and OT were ordered. Discharge planning consulted to help with postop disposition and equipment needs.  Patient had a decent night on the evening of surgery  and started to get up with therapy on day one. Patient was seen in rounds and was ready to go home after therapy. Patient noted relief of radicular leg pain upon discharge.    Discharge Medications: Prior to Admission medications   Medication Sig Start Date End Date Taking? Authorizing Provider  aspirin EC 81 MG tablet Take 81 mg by mouth daily before breakfast.    Historical Provider, MD  docusate sodium (COLACE) 100 MG capsule Take 100 mg by mouth 2 (two) times daily as needed. Constipation    Historical Provider, MD  lisinopril-hydrochlorothiazide (PRINZIDE,ZESTORETIC) 20-12.5 MG per tablet Take 1 tablet by mouth every morning.    Historical Provider, MD  methocarbamol (ROBAXIN) 500 MG tablet Take 500 mg by mouth 3 (three) times daily.    Historical Provider, MD  methocarbamol (ROBAXIN) 500 MG tablet Take 1 tablet (500 mg total) by mouth every 6 (six) hours as needed. 07/24/11 08/03/11  Janann Boeve Tamala Ser, PA  omeprazole (PRILOSEC) 20 MG capsule Take 20 mg by mouth daily.    Historical Provider, MD  OVER THE COUNTER MEDICATION Take 100 mg by mouth daily. Raspberry Ketones    Historical Provider, MD  oxyCODONE-acetaminophen (PERCOCET) 5-325 MG per tablet Take 1-2 tablets by mouth every 4 (four)  hours as needed. 07/24/11 08/03/11  Emogene Muratalla Tamala Ser, PA  predniSONE (DELTASONE) 10 MG tablet Take 10 mg by mouth daily.    Historical Provider, MD  testosterone (ANDROGEL) 50 MG/5GM GEL Place 5 g onto the skin daily.    Historical Provider, MD  venlafaxine (EFFEXOR) 50 MG tablet Take 50 mg by mouth every morning.    Historical Provider, MD    Diet: regular  Activity:WBAT  Follow-up:in 2 weeks  Disposition: Improved Discharged Condition: good   Discharge Orders    Future Orders Please Complete By Expires   Diet general      Call MD / Call 911      Comments:   If you experience chest pain or shortness of breath, CALL 911 and be transported to the hospital emergency room.  If you develope a fever above 101 F, pus (white drainage) or increased drainage or redness at the wound, or calf pain, call your surgeon's office.   Constipation Prevention      Comments:   Drink plenty of fluids.  Prune juice may be helpful.  You may use a stool softener, such as Colace (over the counter) 100 mg twice a day.  Use MiraLax (over the counter) for constipation as needed.   Increase activity slowly as tolerated      Weight Bearing as taught in Physical Therapy      Comments:   Use a walker or crutches as instructed.   Discharge instructions      Comments:   Change your dressing daily. Shower only, no tub bath. Call if any temperatures greater than 101 or any wound complications: [phone] during the day and ask for Dr. Jeannetta Ellis nurse, [facility].   Driving restrictions      Comments:   No driving while on pain meds   Lifting restrictions      Comments:   No lifting     Medication List  As of 07/24/2011  8:21 AM   STOP taking these medications         HYDROcodone-acetaminophen 10-650 MG per tablet         TAKE these medications         aspirin EC 81 MG tablet  Take 81 mg by mouth daily before breakfast.      docusate sodium 100 MG capsule   Commonly known as: COLACE   Take 100  mg by mouth 2 (two) times daily as needed. Constipation      lisinopril-hydrochlorothiazide 20-12.5 MG per tablet   Commonly known as: PRINZIDE,ZESTORETIC   Take 1 tablet by mouth every morning.      methocarbamol 500 MG tablet   Commonly known as: ROBAXIN   Take 500 mg by mouth 3 (three) times daily.      methocarbamol 500 MG tablet   Commonly known as: ROBAXIN   Take 1 tablet (500 mg total) by mouth every 6 (six) hours as needed.      omeprazole 20 MG capsule   Commonly known as: PRILOSEC   Take 20 mg by mouth daily.      OVER THE COUNTER MEDICATION   Take 100 mg by mouth daily. Raspberry Ketones      oxyCODONE-acetaminophen 5-325 MG per tablet   Commonly known as: PERCOCET   Take 1-2 tablets by mouth every 4 (four) hours as needed.      predniSONE 10 MG tablet   Commonly known as: DELTASONE   Take 10 mg by mouth daily.      testosterone 50 MG/5GM Gel   Commonly known as: ANDROGEL   Place 5 g onto the skin daily.      venlafaxine 50 MG tablet   Commonly known as: EFFEXOR   Take 50 mg by mouth every morning.             Signed: Deriona Altemose LAUREN 07/24/2011, 8:21 AM

## 2011-07-24 NOTE — Progress Notes (Signed)
Subjective: Doing very well.No leg pain.   Objective: Vital signs in last 24 hours: Temp:  [97 F (36.1 C)-98.2 F (36.8 C)] 98.2 F (36.8 C) (02/08 0620) Pulse Rate:  [74-102] 79  (02/08 0620) Resp:  [14-21] 18  (02/08 0620) BP: (111-187)/(73-119) 113/77 mmHg (02/08 0620) SpO2:  [91 %-100 %] 96 % (02/08 0620) Weight:  [129.729 kg (286 lb)] 129.729 kg (286 lb) (02/08 0012)  Intake/Output from previous day: 02/07 0701 - 02/08 0700 In: 2883.3 [I.V.:2728.3; IV Piggyback:155] Out: 1350 [Urine:1200; Blood:150] Intake/Output this shift:     Basename 07/22/11 1010  HGB 16.0    Basename 07/22/11 1010  WBC 11.0*  RBC 5.19  HCT 45.5  PLT 205    Basename 07/22/11 1010  NA 137  K 4.1  CL 98  CO2 30  BUN 13  CREATININE 0.99  GLUCOSE 132*  CALCIUM 10.2    Basename 07/22/11 1010  LABPT --  INR 1.00    Neurologically intact Dorsiflexion/Plantar flexion intact  Assessment/Plan: DC today   Bowdy Bair A 07/24/2011, 7:37 AM

## 2011-07-24 NOTE — Progress Notes (Signed)
  CARE MANAGEMENT NOTE 07/24/2011  Patient:  Jeffrey [name], Jeffrey [name]   Account Number:  [id number]  Date Initiated:  07/24/2011  Documentation initiated by:  Colleen Can  Subjective/Objective Assessment:   dx herniated disc; hemi-laminectomy, micro dissectomy L5-S1-right     Action/Plan:   CM spoke with patient. Pt planning to go home where spouse will be caregiver. Has no DME or HH needs   Anticipated DC Date:  07/24/2011   Anticipated DC Plan:  HOME/SELF CARE  In-house referral  NA      DC Planning Services  CM consult      PAC Choice  NA   Choice offered to / List presented to:  NA   DME arranged  NA      DME agency  NA     HH arranged  NA      HH agency  NA   Status of service:  Completed, signed off Medicare Important Message given?  NO (If response is "NO", the following Medicare IM given date fields will be blank) Date Medicare IM given:   Date Additional Medicare IM given:    Discharge Disposition:  HOME/SELF CARE

## 2011-07-24 NOTE — Evaluation (Signed)
Physical Therapy Evaluation Patient Details Name: Jeffrey [name] MRN: [id number] DOB: Oct 10, 1960 Today's Date: 07/24/2011  Problem List:  Patient Active Problem List  Diagnoses  . Disc displacement, lumbar  . Stenosis, spinal, lumbar    Past Medical History:  Past Medical History  Diagnosis Date  . Hypertension   . Herniated disc   . GERD (gastroesophageal reflux disease)   . Sleep apnea uses c-pap  . Depression    Past Surgical History:  Past Surgical History  Procedure Date  . Appendectomy   . Vasectomy   . Ankle surgery     PT Assessment/Plan/Recommendation PT Assessment Clinical Impression Statement: Pt with lumbar lami and discectomy presents with mobility limitations presented by discomfort and back precautions.  Pt very motivated and following required protoccol with min cues.  Pt to d/c home this date with family assist and no immediate HHPT needs. PT Recommendation/Assessment: Patent does not need any further PT services PT Recommendation Follow Up Recommendations: No PT follow up Equipment Recommended: None recommended by OT PT Goals  Acute Rehab PT Goals PT Goal Formulation:  (No goals established - pt for d/c today)  PT Evaluation Precautions/Restrictions  Precautions Precautions: Back Restrictions Weight Bearing Restrictions: No Prior Functioning  Home Living Lives With: Spouse Receives Help From: Family Type of Home: House Home Layout: One level Home Access: Stairs to enter Entrance Stairs-Rails: None Entrance Stairs-Number of Steps: 2 Bathroom Shower/Tub: Psychologist, counselling;Door Bathroom Toilet: Standard (vanity beside.) Bathroom Accessibility: No Home Adaptive Equipment: Walker - rolling Prior Function Level of Independence: Independent with basic ADLs;Independent with transfers;Independent with gait;Independent with homemaking with ambulation Able to Take Stairs?: Yes Driving: Yes Vocation: Full time  employment Cognition Cognition Arousal/Alertness: Awake/alert Overall Cognitive Status: Appears within functional limits for tasks assessed Orientation Level: Oriented X4 Sensation/Coordination Coordination Gross Motor Movements are Fluid and Coordinated: Yes Extremity Assessment RUE Assessment RUE Assessment: Within Functional Limits LUE Assessment LUE Assessment: Within Functional Limits RLE Assessment RLE Assessment: Within Functional Limits LLE Assessment LLE Assessment: Within Functional Limits Mobility (including Balance) Bed Mobility Bed Mobility: Yes Supine to Sit: 5: Supervision;With rails;HOB flat Supine to Sit Details (indicate cue type and reason): VCs for log roll Transfers Transfers: Yes Sit to Stand: 5: Supervision;From bed;From chair/3-in-1;With upper extremity assist Sit to Stand Details (indicate cue type and reason): cues for use of UEs Stand to Sit: 5: Supervision;With upper extremity assist;With armrests;To chair/3-in-1 Stand to Sit Details: cues for use of UEs Ambulation/Gait Ambulation/Gait: Yes Ambulation/Gait Assistance: 5: Supervision;4: Min assist Ambulation/Gait Assistance Details (indicate cue type and reason): Initially ambulating with HHA - transferred pt to RW secondary to mild instability abnd R drift Ambulation Distance (Feet): 400 Feet (61' with HHA and 43' with RW) Assistive device: Rolling walker Gait Pattern: Step-through pattern Stairs: Yes Stairs Assistance: 5: Supervision;4: Min assist Stairs Assistance Details (indicate cue type and reason): cues for sequence Stair Management Technique: One rail Right;Forwards;Step to pattern Number of Stairs: 4     Exercise    End of Session PT - End of Session Activity Tolerance: Patient tolerated treatment well Patient left: in chair;with call bell in reach Nurse Communication: Mobility status for ambulation;Mobility status for transfers General Behavior During Session: [facility] for tasks  performed Cognition: [facility] for tasks performed  Lanessa Shill 07/24/2011, 11:21 AM

## 2011-07-24 NOTE — Op Note (Signed)
NAMEFRANCIS, Jeffrey [name] NO.:  [id number]  MEDICAL RECORD NO.:  [id number]  LOCATION:  1618                         FACILITY:  [facility]  PHYSICIAN:  Georges Lynch. Varshini Arrants, M.D.DATE OF BIRTH:  01-13-1961  DATE OF PROCEDURE:  07/23/2011 DATE OF DISCHARGE:                              OPERATIVE REPORT   SURGEON:  Georges Lynch. Darrelyn Hillock, M.D.  ASSISTANT:  Marlowe Kays, M.D.  PREOPERATIVE DIAGNOSES: 1. Severe spinal stenosis and lateral recess at L5-S1, on the right. 2. Large herniated disk, central to the right at L5-S1 with all his     symptoms of the right lower extremity.  POSTOPERATIVE DIAGNOSIS: 1. Severe spinal stenosis and lateral recess at L5-S1, on the right. 2. Large herniated disk, central to the right at L5-S1 with all his     symptoms of the right lower extremity.  OPERATION: 1. Decompressive lumbar laminectomy for severe lateral recess stenosis     at L5-S1, on the right. 2. Microdiskectomy at L5-S1 on the right. Note:  This was a complex case.  The patient was extremely overweight and it just took great deal of time.  PROCEDURE IN DETAIL:  Under general anesthesia, routine orthopedic prepping and draping of the lower back was carried out with the patient on the spinal frame, prior to surgery, appropriate time-out was carried out.  I also marked the appropriate right side in the holding area.  At this time, the patient had clindamycin 600 mg IV.  Two needles were placed in the back for localization purposes.  X-ray was taken and incision then was made over the L5-S1 interspace, bleeders identified and cauterized.  Note, this wound was extremely deep because of his body mass.  We gently removed and separated the muscle from the lamina and spinous process bilaterally.  Another x-ray was taken with Kocher clamp on L5 spinous process.  Following that, we inserted the extra long McCullough retractors.  We then cauterized all the bleeding muscle.  We then  carried out a hemilaminectomy in the usual fashion.  His lateral recess was extremely tight.  The microscope was brought in and we first went on and decompressed lateral recess.  He had large lateral recess vein, that we first stripped the ligamentum flavum with great care taken not to injure the underlying dura.  At this time, we did a foraminotomy first distally, we isolated the S1 root and proceeded proximally.  At that time, he had large lateral recess veins.  We did nice decompression laterally.  We cauterized lateral recess veins and great care was taken not to injure the nerve roots above, the proximal and distally.  After this, we isolated the disk space.  The needle was placed in the disk space, and we then went on and identified a herniated disk.  Cruciate incision was made in the posterior longitudinal ligament.  Multiple disk fragments were removed.  We utilized the nerve hook, as well as the Epstein curettes and swept all subligamentous.  Following that, we noticed that we now had good decompression of the root.  We made multiple passes in the space.  We looked above and below the disk that was distally and proximally to  make sure there were no other fragments. The dura and nerve root now were freely movable.  We were able to pass the hockey-stick about the area to make sure we were open.  Thoroughly irrigated out the area, loosely applied some thrombin-soaked Gelfoam, closed the wound layers in the usual fashion.  I did leave a small distal deep proximal and distal, and deep distal portions of the wound open for drainage purposes.  Subcu was closed with 0-Vicryl, skin with metal staples, a sterile Neosporin dressing was applied.  Dr. Leslee Home assisted in all phrases of the procedure.          ______________________________ Georges Lynch. Darrelyn Hillock, M.D.     RAG/MEDQ  D:  07/23/2011  T:  07/24/2011  Job:  [id number]

## 2011-07-24 NOTE — Progress Notes (Signed)
Pt for d/c home today. IV d/c'd after IV ABT completed. Dressing CDI to back & dressing supplies given for home use. No changes in am assessments today. Wife in to assist with d/c home. Instructed to call for appt. In 2 wks. D/c instructions & RX given with verbalized understanding.

## 2011-07-24 NOTE — Evaluation (Signed)
Occupational Therapy Evaluation Patient Details Name: Jeffrey [name] MRN: [id number] DOB: 27-Jan-1961 Today's Date: 07/24/2011 EV1 [phone] Problem List:  Patient Active Problem List  Diagnoses  . Disc displacement, lumbar  . Stenosis, spinal, lumbar    Past Medical History:  Past Medical History  Diagnosis Date  . Hypertension   . Herniated disc   . GERD (gastroesophageal reflux disease)   . Sleep apnea uses c-pap  . Depression    Past Surgical History:  Past Surgical History  Procedure Date  . Appendectomy   . Vasectomy   . Ankle surgery     OT Assessment/Plan/Recommendation OT Assessment Clinical Impression Statement: Pt 51 y/o male who presents w/ microdiscectomy and R lumbar laminectomy POD#1. All education completed. No f/u OT needed. OT Recommendation/Assessment: Patient does not need any further OT services OT Recommendation Follow Up Recommendations: No OT follow up Equipment Recommended: None recommended by OT OT Goals    OT Evaluation Precautions/Restrictions  Precautions Precautions: Back Restrictions Weight Bearing Restrictions: No Prior Functioning Home Living Lives With: Spouse Receives Help From: Family Type of Home: House Home Layout: One level Home Access: Stairs to enter Entrance Stairs-Rails: None Entrance Stairs-Number of Steps: 2 Bathroom Shower/Tub: Psychologist, counselling;Door Bathroom Toilet: Standard (vanity beside.) Bathroom Accessibility: No Home Adaptive Equipment: Bedside commode/3-in-1;Shower chair without back Prior Function Level of Independence: Independent with basic ADLs;Independent with transfers;Independent with gait;Independent with homemaking with ambulation Driving: Yes Vocation: Full time employment ADL ADL Grooming: Simulated;Modified independent Where Assessed - Grooming: Standing at sink Upper Body Bathing: Simulated;Modified independent Where Assessed - Upper Body Bathing: Standing at sink Lower Body Bathing:  Simulated;Supervision/safety Where Assessed - Lower Body Bathing: Sit to stand from bed Upper Body Dressing: Simulated;Modified independent Where Assessed - Upper Body Dressing: Standing Lower Body Dressing: Simulated;Supervision/safety Lower Body Dressing Details (indicate cue type and reason): Educated how to dress the LB w/out breaking back precautions. Where Assessed - Lower Body Dressing: Sit to stand from bed Toilet Transfer: Performed;Modified independent Toilet Transfer Method: Proofreader: Raised toilet seat with arms (or 3-in-1 over toilet) Toileting - Clothing Manipulation: Simulated;Modified independent Where Assessed - Toileting Clothing Manipulation: Sit to stand from 3-in-1 or toilet Toileting - Hygiene: Simulated;Modified independent Where Assessed - Toileting Hygiene: Sit to stand from 3-in-1 or toilet Tub/Shower Transfer: Not assessed (Did discuss w/ pt the proper technique for stepping into sho) Tub/Shower Transfer Method: Not assessed ADL Comments: Pt doing very well POD# 1. Pain alleviated upon supine>sit and w/ ambulation. Pt declined education in AE stating family could help him at home. Vision/Perception    Cognition Cognition Arousal/Alertness: Awake/alert Overall Cognitive Status: Appears within functional limits for tasks assessed Orientation Level: Oriented X4 Sensation/Coordination   Extremity Assessment RUE Assessment RUE Assessment: Within Functional Limits LUE Assessment LUE Assessment: Within Functional Limits Mobility  Bed Mobility Bed Mobility: Yes Supine to Sit: 5: Supervision;With rails;HOB flat Supine to Sit Details (indicate cue type and reason): VCs for log roll Transfers Transfers: Yes Sit to Stand: 5: Supervision;From bed;From chair/3-in-1;With upper extremity assist Stand to Sit: 5: Supervision;With upper extremity assist;With armrests;To chair/3-in-1 Exercises   End of Session OT - End of Session Activity  Tolerance: Patient tolerated treatment well Patient left: in chair;with call bell in reach General Behavior During Session: [facility] for tasks performed Cognition: [facility] for tasks performed   Jourdyn Hasler A, OTR/L [phone] 07/24/2011, 9:13 AM

## 2011-07-27 ENCOUNTER — Emergency Department (HOSPITAL_COMMUNITY)
Admission: EM | Admit: 2011-07-27 | Discharge: 2011-07-27 | Disposition: A | Discharge status: Home / Self Care | Payer: BC Managed Care – PPO | Attending: Emergency Medicine | Admitting: Emergency Medicine

## 2011-07-27 ENCOUNTER — Encounter (HOSPITAL_COMMUNITY): Payer: Self-pay | Admitting: *Deleted

## 2011-07-27 DIAGNOSIS — F329 Major depressive disorder, single episode, unspecified: Secondary | ICD-10-CM | POA: Insufficient documentation

## 2011-07-27 DIAGNOSIS — IMO0001 Reserved for inherently not codable concepts without codable children: Secondary | ICD-10-CM | POA: Insufficient documentation

## 2011-07-27 DIAGNOSIS — I10 Essential (primary) hypertension: Secondary | ICD-10-CM | POA: Insufficient documentation

## 2011-07-27 DIAGNOSIS — M79609 Pain in unspecified limb: Secondary | ICD-10-CM | POA: Insufficient documentation

## 2011-07-27 DIAGNOSIS — G8918 Other acute postprocedural pain: Secondary | ICD-10-CM

## 2011-07-27 DIAGNOSIS — Z79899 Other long term (current) drug therapy: Secondary | ICD-10-CM | POA: Insufficient documentation

## 2011-07-27 DIAGNOSIS — K219 Gastro-esophageal reflux disease without esophagitis: Secondary | ICD-10-CM | POA: Insufficient documentation

## 2011-07-27 DIAGNOSIS — F3289 Other specified depressive episodes: Secondary | ICD-10-CM | POA: Insufficient documentation

## 2011-07-27 DIAGNOSIS — F17200 Nicotine dependence, unspecified, uncomplicated: Secondary | ICD-10-CM | POA: Insufficient documentation

## 2011-07-27 DIAGNOSIS — Z7982 Long term (current) use of aspirin: Secondary | ICD-10-CM | POA: Insufficient documentation

## 2011-07-27 MED ORDER — HYDROMORPHONE HCL PF 1 MG/ML IJ SOLN
1.0000 mg | Freq: Once | INTRAMUSCULAR | Status: AC
  Administered 2011-07-27: 1 mg via INTRAVENOUS
  Filled 2011-07-27: qty 1

## 2011-07-27 MED ORDER — ONDANSETRON HCL 4 MG/2ML IJ SOLN
4.0000 mg | Freq: Once | INTRAMUSCULAR | Status: AC
  Administered 2011-07-27: 4 mg via INTRAVENOUS
  Filled 2011-07-27: qty 2

## 2011-07-27 MED ORDER — DIAZEPAM 5 MG PO TABS: 5.0000 mg | ORAL_TABLET | Freq: Three times a day (TID) | ORAL | Status: AC | PRN

## 2011-07-27 MED ORDER — DIAZEPAM 5 MG/ML IJ SOLN
5.0000 mg | Freq: Once | INTRAMUSCULAR | Status: AC
  Administered 2011-07-27: 5 mg via INTRAVENOUS
  Filled 2011-07-27: qty 2

## 2011-07-27 NOTE — ED Notes (Signed)
MD at bedside. 

## 2011-07-27 NOTE — ED Notes (Signed)
Patient arrives from home with sudden onset lower back pain radiating down the right leg that began approx. 1 hr ago after using the restroom. Patient reports hx of back surgery for a ruptured disc last week Thursday; d/c'd to home Friday with no problems.  Also c/o intermittent numbness to right lateral aspect of RLE from hip to toes. 2+ pedal pulses bilat.

## 2011-07-27 NOTE — ED Notes (Signed)
Patient reports taking 2 Percocet and 1 Ribaxin, pta with no relief.

## 2011-07-27 NOTE — ED Provider Notes (Signed)
History     CSN: [id number]  Arrival date & time 07/27/11  7829   First MD Initiated Contact with Patient 07/27/11 [phone]      Chief Complaint  Patient presents with  . Back Pain    (Consider location/radiation/quality/duration/timing/severity/associated sxs/prior treatment) HPI This is a 51 year old white male who had lumbar discectomy 4 days ago by Dr.Gioffre. He has had an unremarkable postoperative course until yesterday morning when his pain increased. He went to the bathroom about 2 this morning and while walking to the bathroom he felt the sudden onset of severe, sharp pain in the right buttock radiating down the back of the right leg. He states this pain is different than the postoperative area incisional pain he had been having. There is no associated numbness or weakness with this pain. He took 20 mg of hydrocodone prior to arrival with partial release of the symptoms. The pain is worse with movement and ambulation. He states it feels like a muscle spasm in addition to the sharpness.  Past Medical History  Diagnosis Date  . Hypertension   . Herniated disc   . GERD (gastroesophageal reflux disease)   . Sleep apnea uses c-pap  . Depression     Past Surgical History  Procedure Date  . Appendectomy   . Vasectomy   . Ankle surgery     History reviewed. No pertinent family history.  History  Substance Use Topics  . Smoking status: Current Everyday Smoker -- 0.5 packs/day  . Smokeless tobacco: Not on file  . Alcohol Use: No      Review of Systems  All other systems reviewed and are negative.    Allergies  Penicillins  Home Medications   Current Outpatient Rx  Name Route Sig Dispense Refill  . ASPIRIN 325 MG PO TABS Oral Take 325 mg by mouth 2 (two) times daily.    Marland Kitchen DOCUSATE SODIUM 100 MG PO CAPS Oral Take 100 mg by mouth 2 (two) times daily as needed. Constipation    . LISINOPRIL-HYDROCHLOROTHIAZIDE 20-12.5 MG PO TABS Oral Take 1 tablet by mouth every  morning.    Marland Kitchen METHOCARBAMOL 500 MG PO TABS Oral Take 1 tablet (500 mg total) by mouth every 6 (six) hours as needed. 30 tablet 0  . OMEPRAZOLE 20 MG PO CPDR Oral Take 20 mg by mouth daily.    Marland Kitchen OVER THE COUNTER MEDICATION Oral Take 100 mg by mouth daily. Raspberry Ketones    . OXYCODONE-ACETAMINOPHEN 5-325 MG PO TABS Oral Take 1-2 tablets by mouth every 4 (four) hours as needed. 30 tablet 0  . PREDNISONE 10 MG PO TABS Oral Take 10 mg by mouth daily.    . TESTOSTERONE 50 MG/5GM TD GEL Transdermal Place 5 g onto the skin daily.    . VENLAFAXINE HCL 50 MG PO TABS Oral Take 50 mg by mouth every morning.      BP 138/80  Pulse 91  Temp(Src) 97.6 F (36.4 C) (Oral)  Resp 20  SpO2 98%  Physical Exam General: Well-developed, well-nourished male in no acute distress; appearance consistent with age of record HENT: normocephalic, atraumatic Eyes: pupils equal round and reactive to light; extraocular muscles intact Neck: supple Heart: regular rate and rhythm Lungs: clear to auscultation bilaterally Abdomen: soft; nondistended; nontender Back: Midline vertical lumbar incision with serous drainage but no warmth; there is peri-incisional tenderness; there is right sciatic notch tenderness Extremities: No deformity; decreased range of motion her right hip due to pain; pulses normal Neurologic: Awake,  alert and oriented; motor function intact in all extremities and symmetric; no facial droop; sensation intact; no saddle anesthesia Skin: Warm and dry Psychiatric: Normal mood and affect    ED Course  Procedures (including critical care time)     MDM  6:55 AM Patient feels much better after IV Dilaudid and Valium.        Hanley Seamen, MD 07/27/11 [phone]

## 2011-07-27 NOTE — ED Notes (Signed)
Pt states he took 2 Hydrocodone 10/650 at 0200 and Robaxin 500mg  also.

## 2011-08-17 ENCOUNTER — Encounter (HOSPITAL_COMMUNITY): Payer: Self-pay | Admitting: Orthopedic Surgery

## 2012-01-28 ENCOUNTER — Emergency Department (HOSPITAL_COMMUNITY)
Admission: EM | Admit: 2012-01-28 | Discharge: 2012-01-28 | Disposition: A | Discharge status: Home / Self Care | Payer: Worker's Compensation | Attending: Emergency Medicine | Admitting: Emergency Medicine

## 2012-01-28 ENCOUNTER — Encounter (HOSPITAL_COMMUNITY): Payer: Self-pay | Admitting: Emergency Medicine

## 2012-01-28 DIAGNOSIS — S01309A Unspecified open wound of unspecified ear, initial encounter: Secondary | ICD-10-CM | POA: Insufficient documentation

## 2012-01-28 DIAGNOSIS — S01319A Laceration without foreign body of unspecified ear, initial encounter: Secondary | ICD-10-CM

## 2012-01-28 DIAGNOSIS — S0101XA Laceration without foreign body of scalp, initial encounter: Secondary | ICD-10-CM

## 2012-01-28 DIAGNOSIS — W208XXA Other cause of strike by thrown, projected or falling object, initial encounter: Secondary | ICD-10-CM | POA: Insufficient documentation

## 2012-01-28 DIAGNOSIS — F3289 Other specified depressive episodes: Secondary | ICD-10-CM | POA: Insufficient documentation

## 2012-01-28 DIAGNOSIS — K219 Gastro-esophageal reflux disease without esophagitis: Secondary | ICD-10-CM | POA: Insufficient documentation

## 2012-01-28 DIAGNOSIS — Z880 Allergy status to penicillin: Secondary | ICD-10-CM | POA: Insufficient documentation

## 2012-01-28 DIAGNOSIS — I10 Essential (primary) hypertension: Secondary | ICD-10-CM | POA: Insufficient documentation

## 2012-01-28 DIAGNOSIS — F17200 Nicotine dependence, unspecified, uncomplicated: Secondary | ICD-10-CM | POA: Insufficient documentation

## 2012-01-28 DIAGNOSIS — S0100XA Unspecified open wound of scalp, initial encounter: Secondary | ICD-10-CM | POA: Insufficient documentation

## 2012-01-28 DIAGNOSIS — F329 Major depressive disorder, single episode, unspecified: Secondary | ICD-10-CM | POA: Insufficient documentation

## 2012-01-28 DIAGNOSIS — G4730 Sleep apnea, unspecified: Secondary | ICD-10-CM | POA: Insufficient documentation

## 2012-01-28 LAB — RAPID URINE DRUG SCREEN, HOSP PERFORMED
Amphetamines: NOT DETECTED
Barbiturates: NOT DETECTED
Benzodiazepines: NOT DETECTED
Cocaine: NOT DETECTED
Opiates: NOT DETECTED
Tetrahydrocannabinol: NOT DETECTED

## 2012-01-28 NOTE — ED Notes (Signed)
Lac to left side of head approx 3 inches.  Bleeding controlled, pt reports no dizziness, Nausea or vomiting.  No LOC.   Pt supervisor at beside. Pt has given urine sample for drug screening.

## 2012-01-28 NOTE — ED Provider Notes (Signed)
History  Scribed for Raeford Razor, MD, the patient was seen in room TR07C/TR07C. This chart was scribed by Candelaria Stagers. The patient's care started at 12:38 PM   CSN: [id number]  Arrival date & time 01/28/12  1214   First MD Initiated Contact with Patient 01/28/12 1223      Chief Complaint  Patient presents with  . Head Laceration    The history is provided by the patient.   Jeffrey [name] is a 51 y.o. male who presents to the Emergency Department complaining of a head laceration and laceration to his left ear that occurred earlier today while at work when a piece of a building he was tearing down fell on his head.  No LOC reported.  No dizziness, nausea, vomiting, back, or neck pain.  Pt takes ASA daily.    Past Medical History  Diagnosis Date  . Hypertension   . Herniated disc   . GERD (gastroesophageal reflux disease)   . Sleep apnea uses c-pap  . Depression     Past Surgical History  Procedure Date  . Appendectomy   . Vasectomy   . Ankle surgery   . Lumbar laminectomy/decompression microdiscectomy 07/23/2011    Procedure: LUMBAR LAMINECTOMY/DECOMPRESSION MICRODISCECTOMY;  Surgeon: Jacki Cones, MD;  Location: [facility];  Service: Orthopedics;  Laterality: Right;    History reviewed. No pertinent family history.  History  Substance Use Topics  . Smoking status: Current Everyday Smoker -- 0.5 packs/day  . Smokeless tobacco: Not on file  . Alcohol Use: No      Review of Systems  Constitutional: Negative for fever.       10 Systems reviewed and are negative for acute change except as noted in the HPI.  HENT: Negative for congestion.   Eyes: Negative for discharge and redness.  Respiratory: Negative for cough and shortness of breath.   Cardiovascular: Negative for chest pain.  Gastrointestinal: Negative for nausea, vomiting and abdominal pain.  Musculoskeletal: Negative for back pain.  Skin: Positive for wound (laceration to left ear, laceration to  head). Negative for rash.  Neurological: Negative for dizziness, syncope, numbness and headaches.  Psychiatric/Behavioral:       No behavior change.    Allergies  Penicillins  Home Medications   Current Outpatient Rx  Name Route Sig Dispense Refill  . ASPIRIN 325 MG PO TABS Oral Take 325 mg by mouth daily.     Marland Kitchen LISINOPRIL-HYDROCHLOROTHIAZIDE 20-12.5 MG PO TABS Oral Take 1 tablet by mouth 2 (two) times daily as needed. Patient takes morning dose every day and will take an evening dose if he feels it is needed for high blood pressure.    Marland Kitchen OMEPRAZOLE 20 MG PO CPDR Oral Take 20 mg by mouth daily.    . TESTOSTERONE 50 MG/5GM TD GEL Transdermal Place 5 g onto the skin daily.    . VENLAFAXINE HCL 50 MG PO TABS Oral Take 50 mg by mouth every morning.      BP 138/85  Pulse 95  Temp 98.2 F (36.8 C) (Oral)  Resp 18  SpO2 96%  Physical Exam  Nursing note and vitals reviewed. Constitutional:       Awake, alert, nontoxic appearance.  HENT:  Head: Atraumatic.  Eyes: Right eye exhibits no discharge. Left eye exhibits no discharge.  Neck: Neck supple.  Pulmonary/Chest: Effort normal. He exhibits no tenderness.  Abdominal: Soft. There is no tenderness. There is no rebound.  Musculoskeletal: He exhibits no tenderness.  9 cm laceration to the anterior left head.  1 cm total laceration to the left earlobe.   Neurological:       Mental status and motor strength appears baseline for patient and situation.  Skin: No rash noted.  Psychiatric: He has a normal mood and affect.    ED Course  Procedures   DIAGNOSTIC STUDIES: Oxygen Saturation is 96% on room air, normal by my interpretation.    COORDINATION OF CARE: 1305 Ordered: Drug screen panel  12:50 LACERATION REPAIR Performed by: Raeford Razor, MD Consent: Verbal consent obtained. Risks and benefits: risks, benefits and alternatives were discussed Patient identity confirmed: provided demographic data Time out performed  prior to procedure Prepped and Draped in normal sterile fashion Wound explored Laceration Location: left ear Laceration Length: 1 cm No Foreign Bodies seen or palpated Anesthesia: local infiltration Local anesthetic: lidocaine 2% without epinephrine Irrigation method: syringe Amount of cleaning: standard Number of sutures or staples: 4 sutures Technique: 6-0 prolene Patient tolerance: Patient tolerated the procedure well with no immediate complications.   12:50 LACERATION REPAIR Performed by: Raeford Razor, MD Consent: Verbal consent obtained. Risks and benefits: risks, benefits and alternatives were discussed Patient identity confirmed: provided demographic data Time out performed prior to procedure Prepped and Draped in normal sterile fashion Wound explored Laceration Location: left anterior head Laceration Length: 9 cm No Foreign Bodies seen or palpated Anesthesia: local infiltration Local anesthetic: lidocaine 2% with epinephrine Irrigation method: syringe Amount of cleaning: standard Number of sutures or staples: 12 Staples Patient tolerance: Patient tolerated the procedure well with no immediate complications.     Labs Reviewed  URINE RAPID DRUG SCREEN (HOSP PERFORMED)  LAB REPORT - SCANNED   No results found.   1. Scalp laceration   2. Laceration of ear       MDM  51 year old male with laceration to scalp near. These were repaired. No loss of consciousness. No significant headache. Nonfocal neurological examination. Plan continued wound care. Return precautions were discussed. Outpatient followup.  I personally preformed the services scribed in my presence. The recorded information has been reviewed and considered. Raeford Razor, MD.        Raeford Razor, MD 02/05/12 [phone]

## 2012-01-28 NOTE — ED Notes (Signed)
Pt was at work and a piece of the shed he was tearing down fell on head.  No LOC, no dizziness no N/V.  EMS reports BP slightly elevated 160/110.  Pt alert oriented X4

## 2012-02-05 ENCOUNTER — Encounter (HOSPITAL_COMMUNITY): Payer: Self-pay | Admitting: Emergency Medicine

## 2012-02-05 ENCOUNTER — Emergency Department (HOSPITAL_COMMUNITY)
Admission: EM | Admit: 2012-02-05 | Discharge: 2012-02-05 | Disposition: A | Discharge status: Home / Self Care | Payer: Worker's Compensation | Attending: Emergency Medicine | Admitting: Emergency Medicine

## 2012-02-05 DIAGNOSIS — S99929A Unspecified injury of unspecified foot, initial encounter: Secondary | ICD-10-CM | POA: Insufficient documentation

## 2012-02-05 DIAGNOSIS — W208XXA Other cause of strike by thrown, projected or falling object, initial encounter: Secondary | ICD-10-CM | POA: Insufficient documentation

## 2012-02-05 DIAGNOSIS — M79659 Pain in unspecified thigh: Secondary | ICD-10-CM

## 2012-02-05 DIAGNOSIS — G4730 Sleep apnea, unspecified: Secondary | ICD-10-CM | POA: Insufficient documentation

## 2012-02-05 DIAGNOSIS — F17200 Nicotine dependence, unspecified, uncomplicated: Secondary | ICD-10-CM | POA: Insufficient documentation

## 2012-02-05 DIAGNOSIS — S99919A Unspecified injury of unspecified ankle, initial encounter: Secondary | ICD-10-CM | POA: Insufficient documentation

## 2012-02-05 DIAGNOSIS — I10 Essential (primary) hypertension: Secondary | ICD-10-CM | POA: Insufficient documentation

## 2012-02-05 DIAGNOSIS — S8780XA Crushing injury of unspecified lower leg, initial encounter: Secondary | ICD-10-CM

## 2012-02-05 DIAGNOSIS — Y990 Civilian activity done for income or pay: Secondary | ICD-10-CM | POA: Insufficient documentation

## 2012-02-05 DIAGNOSIS — S8990XA Unspecified injury of unspecified lower leg, initial encounter: Secondary | ICD-10-CM | POA: Insufficient documentation

## 2012-02-05 DIAGNOSIS — M79609 Pain in unspecified limb: Secondary | ICD-10-CM

## 2012-02-05 DIAGNOSIS — Y9289 Other specified places as the place of occurrence of the external cause: Secondary | ICD-10-CM | POA: Insufficient documentation

## 2012-02-05 DIAGNOSIS — S7010XA Contusion of unspecified thigh, initial encounter: Secondary | ICD-10-CM

## 2012-02-05 DIAGNOSIS — K219 Gastro-esophageal reflux disease without esophagitis: Secondary | ICD-10-CM | POA: Insufficient documentation

## 2012-02-05 LAB — URINALYSIS, ROUTINE W REFLEX MICROSCOPIC
Hgb urine dipstick: NEGATIVE
Nitrite: NEGATIVE
Specific Gravity, Urine: 1.026 (ref 1.005–1.030)
Urobilinogen, UA: 1 mg/dL (ref 0.0–1.0)
pH: 6.5 (ref 5.0–8.0)

## 2012-02-05 LAB — URINE MICROSCOPIC-ADD ON

## 2012-02-05 NOTE — ED Notes (Signed)
Recently discharged from hospital one week ago post a storage building that fell on top of pt. Pt was not scanned after accident.  Went to Dr today due to right leg edema +1 pitting, pain and possible blood clot in left leg. Pt has been noticing blood in urine with clots.

## 2012-02-05 NOTE — ED Provider Notes (Signed)
History  This chart was scribed for Jones Skene, M.D. No att. providers found by Shari Heritage. The patient was seen in room TR02C/TR02C. Patient's care was started at 1730.  CSN: [id number]  Arrival date & time 02/05/12  1730   None     Chief Complaint  Patient presents with  . Leg Pain    The history is provided by the patient. No language interpreter was used.   Jeffrey [name] is a 51 y.o. male who presents to the Emergency Department complaining of left, inner thigh pain and swelling resulting from a direct trauma that occurred 8 days ago. Patient states that a piece of building fell on top of him while at work. Two days ago, his leg started to swell significantly and he noticed color changes to his skin. Patient states that he also passed clots in his urine 2-3 times on Wednesday during the day. Patient says his urine was amber colored on Wednesday evening and Thursday morning. He is feeling significantly better today. He denies any abdominal pain, nausea or vomiting. No testicular or penile pain. No HA, dizziness or light-headedness. Patient has a history of HTN, herniated disc, GERD, sleep apnea and depression. His surgical history includes appendectomy, vasectomy, ankle surgery and lumbar laminectomy.   Past Medical History  Diagnosis Date  . Hypertension   . Herniated disc   . GERD (gastroesophageal reflux disease)   . Sleep apnea uses c-pap  . Depression     Past Surgical History  Procedure Date  . Appendectomy   . Vasectomy   . Ankle surgery   . Lumbar laminectomy/decompression microdiscectomy 07/23/2011    Procedure: LUMBAR LAMINECTOMY/DECOMPRESSION MICRODISCECTOMY;  Surgeon: Jacki Cones, MD;  Location: [facility];  Service: Orthopedics;  Laterality: Right;    History reviewed. No pertinent family history.  History  Substance Use Topics  . Smoking status: Current Everyday Smoker -- 0.5 packs/day  . Smokeless tobacco: Not on file  . Alcohol Use: No       Review of Systems  Gastrointestinal: Negative for nausea, vomiting and abdominal pain.  Genitourinary: Negative for penile pain and testicular pain.  Neurological: Negative for dizziness, light-headedness and headaches.    Allergies  Penicillins  Home Medications   Current Outpatient Rx  Name Route Sig Dispense Refill  . ASPIRIN 325 MG PO TABS Oral Take 325 mg by mouth daily.     Marland Kitchen LISINOPRIL-HYDROCHLOROTHIAZIDE 20-12.5 MG PO TABS Oral Take 1 tablet by mouth 2 (two) times daily as needed. Patient takes morning dose every day and will take an evening dose if he feels it is needed for high blood pressure.    Marland Kitchen OMEPRAZOLE 20 MG PO CPDR Oral Take 20 mg by mouth daily.    . TESTOSTERONE 50 MG/5GM TD GEL Transdermal Place 5 g onto the skin daily.    . VENLAFAXINE HCL 50 MG PO TABS Oral Take 50 mg by mouth every morning.      BP 148/101  Pulse 92  Temp 98.1 F (36.7 C) (Oral)  Resp 20  SpO2 96%  Physical Exam  Nursing note and vitals reviewed. Constitutional: He is oriented to person, place, and time. He appears well-developed and well-nourished. No distress.  HENT:  Head: Normocephalic and atraumatic.  Eyes: Conjunctivae and EOM are normal.  Neck: Neck supple. No tracheal deviation present.  Cardiovascular: Normal rate.   Pulmonary/Chest: Effort normal. No respiratory distress.  Abdominal: He exhibits no distension.  Musculoskeletal: Normal range of motion.  Neurological: He  is alert and oriented to person, place, and time. No sensory deficit.  Skin: Skin is dry. Ecchymosis and laceration noted.       6 cm well healed laceration to left frontal scalp. Well healed ear laceration to the helix of left ear.  Ecchymosis to the right sartorius muscle that extends back to the gluteus and up to the crease.  Psychiatric: He has a normal mood and affect. His behavior is normal.    ED Course  Procedures (including critical care time) DIAGNOSTIC STUDIES: Oxygen Saturation is  96% on room air, adequate by my interpretation.    COORDINATION OF CARE: 7:34PM- Patient of current plan for treatment and evaluation and agrees with plan at this time.  Results for orders placed during the hospital encounter of 02/05/12  URINALYSIS, ROUTINE W REFLEX MICROSCOPIC      Component Value Range   Color, Urine YELLOW  YELLOW   APPearance CLEAR  CLEAR   Specific Gravity, Urine 1.026  1.005 - 1.030   pH 6.5  5.0 - 8.0   Glucose, UA 100 (*) NEGATIVE mg/dL   Hgb urine dipstick NEGATIVE  NEGATIVE   Bilirubin Urine NEGATIVE  NEGATIVE   Ketones, ur NEGATIVE  NEGATIVE mg/dL   Protein, ur NEGATIVE  NEGATIVE mg/dL   Urobilinogen, UA 1.0  0.0 - 1.0 mg/dL   Nitrite NEGATIVE  NEGATIVE   Leukocytes, UA TRACE (*) NEGATIVE  URINE MICROSCOPIC-ADD ON      Component Value Range   Squamous Epithelial / LPF FEW (*) RARE   WBC, UA 0-2  <3 WBC/hpf   Urine-Other MUCOUS PRESENT      No results found.   No diagnosis found.    MDM  Jeffrey [name] is a 51 y.o. male patient was referred to the emergency department with concern for DVT due to trauma in the right leg. Ultrasound of the legs showed no clots consistent with thromboembolism. This is likely a resolving ecchymosis and subcutaneous bruising due to his trauma, we'll continue conservative therapy he can followup as needed with his primary care physician or return to emergency Department for any worsening or change of condition.  The chart was dictated and assembled with the help of a scribe, it has been reviewed me, Jones Skene, M.D.   Jones Skene, MD 02/12/12 1610

## 2012-02-05 NOTE — Progress Notes (Signed)
*  PRELIMINARY RESULTS* Vascular Ultrasound Right lower extremity venous duplex has been completed.  Preliminary findings: No evidence of DVT or baker's cyst.  Farrel Demark, RDMS, RVT  02/05/2012, 7:02 PM

## 2014-04-30 ENCOUNTER — Ambulatory Visit: Payer: Self-pay | Admitting: Unknown Physician Specialty

## 2014-09-06 ENCOUNTER — Other Ambulatory Visit: Payer: Self-pay | Admitting: Orthopedic Surgery

## 2014-09-18 ENCOUNTER — Encounter: Payer: Self-pay | Admitting: *Deleted

## 2014-10-01 ENCOUNTER — Encounter (HOSPITAL_BASED_OUTPATIENT_CLINIC_OR_DEPARTMENT_OTHER): Payer: Self-pay | Admitting: *Deleted

## 2014-10-01 NOTE — Progress Notes (Signed)
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

## 2014-10-05 ENCOUNTER — Ambulatory Visit (HOSPITAL_BASED_OUTPATIENT_CLINIC_OR_DEPARTMENT_OTHER)
Admission: RE | Admit: 2014-10-05 | Discharge: 2014-10-05 | Disposition: A | Discharge status: Home / Self Care | Payer: 59 | Source: Ambulatory Visit | Attending: Orthopedic Surgery | Admitting: Orthopedic Surgery

## 2014-10-05 ENCOUNTER — Encounter (HOSPITAL_BASED_OUTPATIENT_CLINIC_OR_DEPARTMENT_OTHER)
Admission: RE | Disposition: A | Discharge status: Home / Self Care | Payer: Self-pay | Source: Ambulatory Visit | Attending: Orthopedic Surgery

## 2014-10-05 ENCOUNTER — Ambulatory Visit (HOSPITAL_BASED_OUTPATIENT_CLINIC_OR_DEPARTMENT_OTHER): Payer: 59 | Admitting: Anesthesiology

## 2014-10-05 ENCOUNTER — Encounter (HOSPITAL_BASED_OUTPATIENT_CLINIC_OR_DEPARTMENT_OTHER): Payer: Self-pay | Admitting: Anesthesiology

## 2014-10-05 DIAGNOSIS — Z79899 Other long term (current) drug therapy: Secondary | ICD-10-CM | POA: Insufficient documentation

## 2014-10-05 DIAGNOSIS — I10 Essential (primary) hypertension: Secondary | ICD-10-CM | POA: Diagnosis not present

## 2014-10-05 DIAGNOSIS — E119 Type 2 diabetes mellitus without complications: Secondary | ICD-10-CM | POA: Insufficient documentation

## 2014-10-05 DIAGNOSIS — G4730 Sleep apnea, unspecified: Secondary | ICD-10-CM | POA: Insufficient documentation

## 2014-10-05 DIAGNOSIS — Z6841 Body Mass Index (BMI) 40.0 and over, adult: Secondary | ICD-10-CM | POA: Insufficient documentation

## 2014-10-05 DIAGNOSIS — K219 Gastro-esophageal reflux disease without esophagitis: Secondary | ICD-10-CM | POA: Diagnosis not present

## 2014-10-05 DIAGNOSIS — G5601 Carpal tunnel syndrome, right upper limb: Secondary | ICD-10-CM | POA: Insufficient documentation

## 2014-10-05 DIAGNOSIS — F17200 Nicotine dependence, unspecified, uncomplicated: Secondary | ICD-10-CM | POA: Diagnosis not present

## 2014-10-05 DIAGNOSIS — Z9989 Dependence on other enabling machines and devices: Secondary | ICD-10-CM | POA: Insufficient documentation

## 2014-10-05 DIAGNOSIS — G5621 Lesion of ulnar nerve, right upper limb: Secondary | ICD-10-CM | POA: Insufficient documentation

## 2014-10-05 DIAGNOSIS — Z7982 Long term (current) use of aspirin: Secondary | ICD-10-CM | POA: Insufficient documentation

## 2014-10-05 HISTORY — PX: CARPAL TUNNEL RELEASE: SHX101

## 2014-10-05 HISTORY — PX: ULNAR TUNNEL RELEASE: SHX820

## 2014-10-05 HISTORY — DX: Presence of spectacles and contact lenses: Z97.3

## 2014-10-05 HISTORY — DX: Type 2 diabetes mellitus without complications: E11.9

## 2014-10-05 LAB — POCT HEMOGLOBIN-HEMACUE: Hemoglobin: 15.5 g/dL (ref 13.0–17.0)

## 2014-10-05 LAB — GLUCOSE, CAPILLARY
GLUCOSE-CAPILLARY: 148 mg/dL — AB (ref 70–99)
Glucose-Capillary: 149 mg/dL — ABNORMAL HIGH (ref 70–99)

## 2014-10-05 SURGERY — CARPAL TUNNEL RELEASE
Anesthesia: General | Site: Hand | Laterality: Right

## 2014-10-05 MED ORDER — OXYCODONE HCL 5 MG PO TABS: 10.0000 mg | ORAL_TABLET | ORAL | Status: DC | PRN

## 2014-10-05 MED ORDER — OXYCODONE HCL 5 MG PO TABS: 5.0000 mg | ORAL_TABLET | Freq: Once | ORAL | Status: DC | PRN

## 2014-10-05 MED ORDER — OXYCODONE HCL 5 MG/5ML PO SOLN: 5.0000 mg | Freq: Once | ORAL | Status: DC | PRN

## 2014-10-05 MED ORDER — FENTANYL CITRATE (PF) 100 MCG/2ML IJ SOLN
INTRAMUSCULAR | Status: AC
Start: 2014-10-05 — End: 2014-10-05
  Filled 2014-10-05: qty 6

## 2014-10-05 MED ORDER — MIDAZOLAM HCL 2 MG/2ML IJ SOLN: 1.0000 mg | INTRAMUSCULAR | Status: DC | PRN

## 2014-10-05 MED ORDER — BUPIVACAINE HCL (PF) 0.5 % IJ SOLN
INTRAMUSCULAR | Status: DC | PRN
  Administered 2014-10-05: 19 mL

## 2014-10-05 MED ORDER — DEXAMETHASONE SODIUM PHOSPHATE 4 MG/ML IJ SOLN
INTRAMUSCULAR | Status: DC | PRN
  Administered 2014-10-05: 10 mg via INTRAVENOUS

## 2014-10-05 MED ORDER — PROPOFOL 10 MG/ML IV BOLUS
INTRAVENOUS | Status: DC | PRN
  Administered 2014-10-05: 200 mg via INTRAVENOUS

## 2014-10-05 MED ORDER — HYDROMORPHONE HCL 1 MG/ML IJ SOLN: 0.2500 mg | INTRAMUSCULAR | Status: DC | PRN

## 2014-10-05 MED ORDER — SODIUM CHLORIDE 0.9 % IV SOLN
1500.0000 mg | INTRAVENOUS | Status: AC
  Administered 2014-10-05 (×2): 1500 mg via INTRAVENOUS

## 2014-10-05 MED ORDER — CHLORHEXIDINE GLUCONATE 4 % EX LIQD: 60.0000 mL | Freq: Once | CUTANEOUS | Status: DC

## 2014-10-05 MED ORDER — MIDAZOLAM HCL 5 MG/5ML IJ SOLN
INTRAMUSCULAR | Status: DC | PRN
  Administered 2014-10-05: 2 mg via INTRAVENOUS

## 2014-10-05 MED ORDER — VANCOMYCIN HCL IN DEXTROSE 500-5 MG/100ML-% IV SOLN
INTRAVENOUS | Status: AC
  Filled 2014-10-05: qty 300

## 2014-10-05 MED ORDER — ONDANSETRON HCL 4 MG/2ML IJ SOLN: 4.0000 mg | Freq: Four times a day (QID) | INTRAMUSCULAR | Status: DC | PRN

## 2014-10-05 MED ORDER — FENTANYL CITRATE (PF) 100 MCG/2ML IJ SOLN
INTRAMUSCULAR | Status: DC | PRN
  Administered 2014-10-05 (×2): 50 ug via INTRAVENOUS
  Administered 2014-10-05: 25 ug via INTRAVENOUS
  Administered 2014-10-05: 50 ug via INTRAVENOUS

## 2014-10-05 MED ORDER — LACTATED RINGERS IV SOLN
INTRAVENOUS | Status: DC
Start: 2014-10-05 — End: 2014-10-05
  Administered 2014-10-05: 08:00:00 via INTRAVENOUS

## 2014-10-05 MED ORDER — ONDANSETRON HCL 4 MG/2ML IJ SOLN
INTRAMUSCULAR | Status: DC | PRN
  Administered 2014-10-05: 4 mg via INTRAVENOUS

## 2014-10-05 MED ORDER — LIDOCAINE HCL (CARDIAC) 20 MG/ML IV SOLN
INTRAVENOUS | Status: DC | PRN
  Administered 2014-10-05: 50 mg via INTRAVENOUS

## 2014-10-05 MED ORDER — MIDAZOLAM HCL 2 MG/2ML IJ SOLN
INTRAMUSCULAR | Status: AC
  Filled 2014-10-05: qty 2

## 2014-10-05 SURGICAL SUPPLY — 80 items
BANDAGE ELASTIC 3 VELCRO ST LF (GAUZE/BANDAGES/DRESSINGS) ×6 IMPLANT
BANDAGE ELASTIC 4 VELCRO ST LF (GAUZE/BANDAGES/DRESSINGS) IMPLANT
BANDAGE ELASTIC 6 VELCRO ST LF (GAUZE/BANDAGES/DRESSINGS) ×3 IMPLANT
BLADE CARPAL TUNNEL SNGL USE (BLADE) IMPLANT
BLADE CLIPPER SURG (BLADE) IMPLANT
BLADE SURG 15 STRL LF DISP TIS (BLADE) ×6 IMPLANT
BLADE SURG 15 STRL SS (BLADE) ×9
BNDG CONFORM 3 STRL LF (GAUZE/BANDAGES/DRESSINGS) ×3 IMPLANT
BNDG GAUZE ELAST 4 BULKY (GAUZE/BANDAGES/DRESSINGS) ×3 IMPLANT
BRUSH SCRUB EZ PLAIN DRY (MISCELLANEOUS) ×3 IMPLANT
CANISTER SUCT 1200ML W/VALVE (MISCELLANEOUS) ×3 IMPLANT
CORDS BIPOLAR (ELECTRODE) ×3 IMPLANT
COVER BACK TABLE 60X90IN (DRAPES) ×3 IMPLANT
COVER MAYO STAND STRL (DRAPES) ×3 IMPLANT
CUFF TOURN SGL LL 18 NRW (TOURNIQUET CUFF) ×6 IMPLANT
CUFF TOURNIQUET SINGLE 18IN (TOURNIQUET CUFF) ×3 IMPLANT
DECANTER SPIKE VIAL GLASS SM (MISCELLANEOUS) IMPLANT
DRAIN PENROSE 1/4X12 LTX STRL (WOUND CARE) IMPLANT
DRAPE EXTREMITY T 121X128X90 (DRAPE) ×3 IMPLANT
DRAPE INCISE IOBAN 66X45 STRL (DRAPES) IMPLANT
DRAPE SURG 17X23 STRL (DRAPES) ×3 IMPLANT
DRSG EMULSION OIL 3X3 NADH (GAUZE/BANDAGES/DRESSINGS) ×6 IMPLANT
EVACUATOR 1/8 PVC DRAIN (DRAIN) IMPLANT
EVACUATOR 3/16  PVC DRAIN (DRAIN)
EVACUATOR 3/16 PVC DRAIN (DRAIN) IMPLANT
EVACUATOR SILICONE 100CC (DRAIN) IMPLANT
GAUZE SPONGE 4X4 12PLY STRL (GAUZE/BANDAGES/DRESSINGS) ×3 IMPLANT
GAUZE SPONGE 4X4 16PLY XRAY LF (GAUZE/BANDAGES/DRESSINGS) IMPLANT
GAUZE XEROFORM 1X8 LF (GAUZE/BANDAGES/DRESSINGS) ×3 IMPLANT
GLOVE BIO SURGEON STRL SZ8 (GLOVE) ×3 IMPLANT
GLOVE BIOGEL M STRL SZ7.5 (GLOVE) ×3 IMPLANT
GLOVE BIOGEL PI IND STRL 6.5 (GLOVE) ×2 IMPLANT
GLOVE BIOGEL PI INDICATOR 6.5 (GLOVE) ×1
GLOVE SS BIOGEL STRL SZ 8 (GLOVE) ×4 IMPLANT
GLOVE SUPERSENSE BIOGEL SZ 8 (GLOVE) ×2
GLOVE SURG SS PI 6.5 STRL IVOR (GLOVE) ×3 IMPLANT
GOWN STRL REUS W/ TWL LRG LVL3 (GOWN DISPOSABLE) ×2 IMPLANT
GOWN STRL REUS W/ TWL XL LVL3 (GOWN DISPOSABLE) ×2 IMPLANT
GOWN STRL REUS W/TWL LRG LVL3 (GOWN DISPOSABLE) ×3
GOWN STRL REUS W/TWL XL LVL3 (GOWN DISPOSABLE) ×2
LOOP VESSEL MAXI BLUE (MISCELLANEOUS) IMPLANT
MARKER SKIN DUAL TIP RULER LAB (MISCELLANEOUS) ×3 IMPLANT
NDL SAFETY ECLIPSE 18X1.5 (NEEDLE) ×2 IMPLANT
NEEDLE HYPO 18GX1.5 SHARP (NEEDLE) ×3
NEEDLE HYPO 22GX1.5 SAFETY (NEEDLE) IMPLANT
NEEDLE HYPO 25X1 1.5 SAFETY (NEEDLE) ×6 IMPLANT
NS IRRIG 1000ML POUR BTL (IV SOLUTION) ×3 IMPLANT
PACK BASIN DAY SURGERY FS (CUSTOM PROCEDURE TRAY) ×3 IMPLANT
PAD ALCOHOL SWAB (MISCELLANEOUS) ×24 IMPLANT
PAD CAST 3X4 CTTN HI CHSV (CAST SUPPLIES) ×4 IMPLANT
PADDING CAST ABS 4INX4YD NS (CAST SUPPLIES) ×1
PADDING CAST ABS COTTON 4X4 ST (CAST SUPPLIES) ×2 IMPLANT
PADDING CAST COTTON 3X4 STRL (CAST SUPPLIES) ×4
SHEET MEDIUM DRAPE 40X70 STRL (DRAPES) IMPLANT
SPLINT FAST PLASTER 5X30 (CAST SUPPLIES)
SPLINT FIBERGLASS 3X35 (CAST SUPPLIES) ×3 IMPLANT
SPLINT PLASTER CAST FAST 5X30 (CAST SUPPLIES) IMPLANT
SPLINT PLASTER CAST XFAST 3X15 (CAST SUPPLIES) IMPLANT
SPLINT PLASTER XTRA FASTSET 3X (CAST SUPPLIES)
SPONGE LAP 4X18 X RAY DECT (DISPOSABLE) IMPLANT
STOCKINETTE 4X48 STRL (DRAPES) ×3 IMPLANT
STOCKINETTE SYNTHETIC 3 UNSTER (CAST SUPPLIES) ×3 IMPLANT
STRIP CLOSURE SKIN 1/2X4 (GAUZE/BANDAGES/DRESSINGS) IMPLANT
SUCTION FRAZIER TIP 10 FR DISP (SUCTIONS) ×3 IMPLANT
SUT BONE WAX W31G (SUTURE) IMPLANT
SUT FIBERWIRE 2-0 18 17.9 3/8 (SUTURE)
SUT PROLENE 4 0 PS 2 18 (SUTURE) ×6 IMPLANT
SUT VIC AB 3-0 FS2 27 (SUTURE) IMPLANT
SUT VIC AB 4-0 P-3 18XBRD (SUTURE) IMPLANT
SUT VIC AB 4-0 P3 18 (SUTURE)
SUTURE FIBERWR 2-0 18 17.9 3/8 (SUTURE) IMPLANT
SYR BULB 3OZ (MISCELLANEOUS) ×3 IMPLANT
SYR CONTROL 10ML LL (SYRINGE) ×6 IMPLANT
TAPE SURG TRANSPORE 1 IN (GAUZE/BANDAGES/DRESSINGS) ×2 IMPLANT
TAPE SURGICAL TRANSPORE 1 IN (GAUZE/BANDAGES/DRESSINGS) ×1
TOWEL OR 17X24 6PK STRL BLUE (TOWEL DISPOSABLE) ×6 IMPLANT
TOWEL OR NON WOVEN STRL DISP B (DISPOSABLE) ×3 IMPLANT
TRAY DSU PREP LF (CUSTOM PROCEDURE TRAY) ×3 IMPLANT
TUBE CONNECTING 20X1/4 (TUBING) ×3 IMPLANT
UNDERPAD 30X30 INCONTINENT (UNDERPADS AND DIAPERS) ×3 IMPLANT

## 2014-10-05 NOTE — Anesthesia Procedure Notes (Signed)
Procedure Name: LMA Insertion Date/Time: 10/05/2014 8:52 AM Performed by: Genevieve NorlanderLINKA, Averil Digman L Pre-anesthesia Checklist: Patient identified, Emergency Drugs available, Suction available, Patient being monitored and Timeout performed Patient Re-evaluated:Patient Re-evaluated prior to inductionOxygen Delivery Method: Circle System Utilized Preoxygenation: Pre-oxygenation with 100% oxygen Intubation Type: IV induction Ventilation: Mask ventilation without difficulty LMA: LMA inserted LMA Size: 5.0 Number of attempts: 1 Airway Equipment and Method: Bite block Placement Confirmation: positive ETCO2 Tube secured with: Tape Dental Injury: Teeth and Oropharynx as per pre-operative assessment

## 2014-10-05 NOTE — Anesthesia Preprocedure Evaluation (Signed)
Anesthesia Evaluation  Patient identified by MRN, date of birth, ID band Patient awake    Reviewed: Allergy & Precautions, NPO status , Patient's Chart, lab work & pertinent test results  Airway Mallampati: II   Neck ROM: full    Dental   Pulmonary sleep apnea and Continuous Positive Airway Pressure Ventilation , Current Smoker,  breath sounds clear to auscultation        Cardiovascular hypertension, Rhythm:regular Rate:Normal     Neuro/Psych Depression    GI/Hepatic GERD-  ,  Endo/Other  diabetes, Type obesity  Renal/GU      Musculoskeletal   Abdominal   Peds  Hematology   Anesthesia Other Findings   Reproductive/Obstetrics                             Anesthesia Physical Anesthesia Plan  ASA: II  Anesthesia Plan: General   Post-op Pain Management:    Induction: Intravenous  Airway Management Planned: LMA  Additional Equipment:   Intra-op Plan:   Post-operative Plan:   Informed Consent: I have reviewed the patients History and Physical, chart, labs and discussed the procedure including the risks, benefits and alternatives for the proposed anesthesia with the patient or authorized representative who has indicated his/her understanding and acceptance.     Plan Discussed with: CRNA, Anesthesiologist and Surgeon  Anesthesia Plan Comments:         Anesthesia Quick Evaluation

## 2014-10-05 NOTE — Transfer of Care (Signed)
Immediate Anesthesia Transfer of Care Note  Patient: Jeffrey [name]  Procedure(s) Performed: Procedure(s): RIGHT CARPAL TUNNEL RELEASE (Right) RIGHT CUBITAL TUNNEL RELEASE (Right)  Patient Location: PACU  Anesthesia Type:General  Level of Consciousness: sedated and patient cooperative  Airway & Oxygen Therapy: Patient Spontanous Breathing and Patient connected to face mask oxygen  Post-op Assessment: Report given to RN and Post -op Vital signs reviewed and stable  Post vital signs: Reviewed and stable  Last Vitals:  Filed Vitals:   10/05/14 0746  BP: 136/80  Pulse: 72  Temp: 36.8 C  Resp: 20    Complications: No apparent anesthesia complications

## 2014-10-05 NOTE — Anesthesia Postprocedure Evaluation (Signed)
Anesthesia Post Note  Patient: Jeffrey KannerDavid Anthony [name]  Procedure(s) Performed: Procedure(s) (LRB): RIGHT CARPAL TUNNEL RELEASE (Right) RIGHT CUBITAL TUNNEL RELEASE (Right)  Anesthesia type: General  Patient location: PACU  Post pain: Pain level controlled and Adequate analgesia  Post assessment: Post-op Vital signs reviewed, Patient's Cardiovascular Status Stable, Respiratory Function Stable, Patent Airway and Pain level controlled  Last Vitals:  Filed Vitals:   10/05/14 1101  BP: 129/84  Pulse: 74  Temp: 36.4 C  Resp: 20    Post vital signs: Reviewed and stable  Level of consciousness: awake, alert  and oriented  Complications: No apparent anesthesia complications

## 2014-10-05 NOTE — H&P (Signed)
Jeffrey KannerDavid Anthony [name] is an 54 y.o. male.   Chief Complaint: R CTS R CUTS HPI: presents for RCTR &RCUTR Patient presents for evaluation and treatment of the of their upper extremity predicament. The patient denies neck, back, chest or  abdominal pain. The patient notes that they have no lower extremity problems. The patients primary complaint is noted. We are planning surgical care pathway for the upper extremity.  Past Medical History  Diagnosis Date  . Hypertension   . Herniated disc   . GERD (gastroesophageal reflux disease)   . Sleep apnea uses c-pap  . Depression   . Diabetes mellitus without complication   . Wears glasses     Past Surgical History  Procedure Laterality Date  . Appendectomy    . Vasectomy    . Ankle surgery      left fx  . Lumbar laminectomy/decompression microdiscectomy  07/23/2011    Procedure: LUMBAR LAMINECTOMY/DECOMPRESSION MICRODISCECTOMY;  Surgeon: Jacki Conesonald A Gioffre, MD;  Location: [facility];  Service: Orthopedics;  Laterality: Right;  . Colonoscopy      History reviewed. No pertinent family history. Social History:  reports that he has been smoking.  He does not have any smokeless tobacco history on file. He reports that he does not drink alcohol or use illicit drugs.  Allergies:  Allergies  Allergen Reactions  . Penicillins Anaphylaxis    Medications Prior to Admission  Medication Sig Dispense Refill  . aspirin 325 MG tablet Take 325 mg by mouth daily.     Marland Kitchen. glyBURIDE (DIABETA) 5 MG tablet Take 5 mg by mouth daily with breakfast.    . lisinopril-hydrochlorothiazide (PRINZIDE,ZESTORETIC) 20-12.5 MG per tablet Take 1 tablet by mouth 2 (two) times daily as needed. Patient takes morning dose every day and will take an evening dose if he feels it is needed for high blood pressure.    Marland Kitchen. omeprazole (PRILOSEC) 20 MG capsule Take 20 mg by mouth daily.    . tadalafil (CIALIS) 10 MG tablet Take 10 mg by mouth daily as needed for erectile dysfunction.       Results for orders placed or performed during the hospital encounter of 10/05/14 (from the past 48 hour(s))  Glucose, capillary     Status: Abnormal   Collection Time: 10/05/14  8:13 AM  Result Value Ref Range   Glucose-Capillary 149 (H) 70 - 99 mg/dL   No results found.  Review of Systems  Respiratory: Negative.   Genitourinary: Negative.   Neurological: Negative.   Endo/Heme/Allergies: Negative.     Blood pressure 136/80, pulse 72, temperature 98.3 F (36.8 C), temperature source Oral, resp. rate 20, height 5\' 10"  (1.778 m), weight 132.053 kg (291 lb 2 oz), SpO2 97 %. Physical Exam R CTS R CUTS The patient is alert and oriented in no acute distress. The patient complains of pain in the affected upper extremity.  The patient is noted to have a normal HEENT exam. Lung fields show equal chest expansion and no shortness of breath. Abdomen exam is nontender without distention. Lower extremity examination does not show any fracture dislocation or blood clot symptoms. Pelvis is stable and the neck and back are stable and nontender. Assessment/Plan We are planning surgery for your upper extremity. The risk and benefits of surgery to include risk of bleeding, infection, anesthesia,  damage to normal structures and failure of the surgery to accomplish its intended goals of relieving symptoms and restoring function have been discussed in detail. With this in mind we plan to  proceed. I have specifically discussed with the patient the pre-and postoperative regime and the dos and don'ts and risk and benefits in great detail. Risk and benefits of surgery also include risk of dystrophy(CRPS), chronic nerve pain, failure of the healing process to go onto completion and other inherent risks of surgery The relavent the pathophysiology of the disease/injury process, as well as the alternatives for treatment and postoperative course of action has been discussed in great detail with the patient who  desires to proceed.  We will do everything in our power to help you (the patient) restore function to the upper extremity. It is a pleasure to see this patient today.   Karen Chafe 10/05/2014, 8:34 AM

## 2014-10-05 NOTE — Op Note (Signed)
See [id number]#[id number] Amanda PeaGramig MD

## 2014-10-05 NOTE — Discharge Instructions (Signed)
Keep bandage clean and dry.  Call for any problems.  No smoking.  Criteria for driving a car: you should be off your pain medicine for 7-8 hours, able to drive one handed(confident), thinking clearly and feeling able in your judgement to drive. Continue elevation as it will decrease swelling.  If instructed by MD move your fingers within the confines of the bandage/splint.  Use ice if instructed by your MD. Call immediately for any sudden loss of feeling in your hand/arm or change in functional abilities of the extremity.We recommend that you to take vitamin C 1000 mg a day to promote healing. We also recommend that if you require  pain medicine that you take a stool softener to prevent constipation as most pain medicines will have constipation side effects. We recommend either Peri-Colace or Senokot and recommend that you also consider adding MiraLAX to prevent the constipation affects from pain medicine if you are required to use them. These medicines are over the counter and maybe purchased at a local pharmacy. A cup of yogurt and a probiotic can also be helpful during the recovery process as the medicines can disrupt your intestinal environment.  We rec Vit B6 200 mg a day to promote nerve health  Call your surgeon if you experience:   1.  Fever over 101.0. 2.  Inability to urinate. 3.  Nausea and/or vomiting. 4.  Extreme swelling or bruising at the surgical site. 5.  Continued bleeding from the incision. 6.  Increased pain, redness or drainage from the incision. 7.  Problems related to your pain medication. 8. Any change in color, movement and/or sensation 9. Any problems and/or concerns   Post Anesthesia Home Care Instructions  Activity: Get plenty of rest for the remainder of the day. A responsible adult should stay with you for 24 hours following the procedure.  For the next 24 hours, DO NOT: -Drive a car -Advertising copywriterperate machinery -Drink alcoholic beverages -Take any medication unless  instructed by your physician -Make any legal decisions or sign important papers.  Meals: Start with liquid foods such as gelatin or soup. Progress to regular foods as tolerated. Avoid greasy, spicy, heavy foods. If nausea and/or vomiting occur, drink only clear liquids until the nausea and/or vomiting subsides. Call your physician if vomiting continues.  Special Instructions/Symptoms: Your throat may feel dry or sore from the anesthesia or the breathing tube placed in your throat during surgery. If this causes discomfort, gargle with warm salt water. The discomfort should disappear within 24 hours.  If you had a scopolamine patch placed behind your ear for the management of post- operative nausea and/or vomiting:  1. The medication in the patch is effective for 72 hours, after which it should be removed.  Wrap patch in a tissue and discard in the trash. Wash hands thoroughly with soap and water. 2. You may remove the patch earlier than 72 hours if you experience unpleasant side effects which may include dry mouth, dizziness or visual disturbances. 3. Avoid touching the patch. Wash your hands with soap and water after contact with the patch.

## 2014-10-08 ENCOUNTER — Encounter (HOSPITAL_BASED_OUTPATIENT_CLINIC_OR_DEPARTMENT_OTHER): Payer: Self-pay | Admitting: Orthopedic Surgery

## 2014-10-08 LAB — SURGICAL PATHOLOGY

## 2014-10-08 NOTE — Op Note (Signed)
[name], [name] NO.:  [id number]  MEDICAL RECORD NO.:  [id number]  LOCATION:                                FACILITY:  [facility]  PHYSICIAN:  Dionne Ano. Sabian Kuba, M.D.DATE OF BIRTH:  [birth year]  DATE OF PROCEDURE:  10/05/2014 DATE OF DISCHARGE:  10/05/2014                              OPERATIVE REPORT   PREOPERATIVE DIAGNOSES: 1. Right carpal tunnel syndrome. 2. Right ulnar nerve compression at the elbow.  POSTOPERATIVE DIAGNOSES: 1. Right carpal tunnel syndrome. 2. Right ulnar nerve compression at the elbow.  PROCEDURES: 1. Right open carpal tunnel release. 2. Right ulnar nerve decompression at the elbow in situ.  SURGEON:  Dionne Ano. Amanda Pea, M.D.  ASSISTANT:  None.  COMPLICATIONS:  None.  ANESTHESIA:  General.  TOURNIQUET TIME:  Less than an hour.  INDICATIONS:  A pleasant 54 year old male with the above-mentioned diagnosis.  Mr. Perotti has severe advanced changes in his hand with constant numbness and some early weakness.  He understands that we are going to plan to try to give him the best environment possible to afford him a symptom resolution.  OPERATION DETAILS:  The patient was seen by myself and Anesthesia, taken to the operative suite, underwent smooth induction of general anesthetic.  He was prepped and draped in usual sterile fashion with Betadine scrub and paint, and following this, underwent final time-out and secured field being stabilized.  With the final time-out called, the patient then underwent elevation of the tourniquet.  Under sterile field, a 2 cm incision was made at the distal edge of the transverse carpal ligament.  Dissection was carried down.  Palmar fascia incised. Following this, the distal edge of the transverse carpal ligament was identified and released under 4.0 loupe magnification.  Fat pad egressed nicely.  He had a firm, stout distal ligament, which was released off the ulnar edge of the canal region.   Following this, distal-to-proximal dissection was carried out until adequate room was available to slide the blunt tip iridectomy scissors.  The patient tolerated this well. There were no complicating features.  With this complete, the patient then had a very careful and cautious look to the canal.  The canal was nicely released.  There were no complicating features, space-occupying lesions, or other problems.  Once this was performed, we then very carefully and cautiously irrigated the canal, secured hemostasis at the end of the operation and closed the wound.  A 9 mL of Sensorcaine with epinephrine was placed on wound for postop analgesia.  Once this was complete, curvilinear posterior medial incision was made about the elbow.  Dissection was carried down.  He had a very stout, thick elbow.  We identified the nerve proximally and released the arcade of Struthers and medial intermuscular septum.  Following this, the patient underwent release of the cubital tunnel and Osborne ligament. He had a very significantly compressed area over the cubital tunnel. This was the area of maximum compression.  Following this, the 2 edges of the FCU, but superficial and deep were released without problems. The patient tolerated this well.  There were no complicating issues, features, or other notables.  We irrigated copiously with tourniquet  deflated and secured hemostasis.  Following this, the wound was irrigated and closed.  Once this was complete, we performed a sterile dressing application.  The patient tolerated this nicely.  Following sterile dressing, the patient then underwent a very careful and cautious dressing application.  I should note that I placed 10 mL of Sensorcaine in the wound medially about the elbow for postop analgesia.  He was awoken from anesthesia after dressing and sling were applied, taken to the recovery room.  He will be monitored and then discharged to home.  We will see  him back in the office in 7-10 days.  Office will call for his followup.  Do's and don'ts have been discussed and all questions have been addressed.     Dionne AnoWilliam M. Amanda PeaGramig, M.D.     [facility] CenterWMG/MEDQ  D:  10/05/2014  T:  10/05/2014  Job:  [id number]

## 2014-10-19 ENCOUNTER — Inpatient Hospital Stay: Payer: 59

## 2014-10-19 ENCOUNTER — Inpatient Hospital Stay
Admission: AD | Admit: 2014-10-19 | Discharge: 2014-10-20 | DRG: 355 | Disposition: A | Discharge status: Home / Self Care | Payer: 59 | Source: Ambulatory Visit | Attending: Surgery | Admitting: Surgery

## 2014-10-19 DIAGNOSIS — Z9049 Acquired absence of other specified parts of digestive tract: Secondary | ICD-10-CM | POA: Diagnosis present

## 2014-10-19 DIAGNOSIS — Z8249 Family history of ischemic heart disease and other diseases of the circulatory system: Secondary | ICD-10-CM

## 2014-10-19 DIAGNOSIS — Z823 Family history of stroke: Secondary | ICD-10-CM

## 2014-10-19 DIAGNOSIS — K219 Gastro-esophageal reflux disease without esophagitis: Secondary | ICD-10-CM | POA: Diagnosis present

## 2014-10-19 DIAGNOSIS — K420 Umbilical hernia with obstruction, without gangrene: Secondary | ICD-10-CM | POA: Diagnosis present

## 2014-10-19 DIAGNOSIS — I10 Essential (primary) hypertension: Secondary | ICD-10-CM | POA: Diagnosis present

## 2014-10-19 DIAGNOSIS — E119 Type 2 diabetes mellitus without complications: Secondary | ICD-10-CM | POA: Diagnosis present

## 2014-10-19 DIAGNOSIS — F17210 Nicotine dependence, cigarettes, uncomplicated: Secondary | ICD-10-CM | POA: Diagnosis present

## 2014-10-19 DIAGNOSIS — Z880 Allergy status to penicillin: Secondary | ICD-10-CM | POA: Diagnosis not present

## 2014-10-19 DIAGNOSIS — G4730 Sleep apnea, unspecified: Secondary | ICD-10-CM | POA: Diagnosis present

## 2014-10-19 DIAGNOSIS — Z79899 Other long term (current) drug therapy: Secondary | ICD-10-CM

## 2014-10-19 DIAGNOSIS — R1032 Left lower quadrant pain: Secondary | ICD-10-CM

## 2014-10-19 DIAGNOSIS — Z888 Allergy status to other drugs, medicaments and biological substances status: Secondary | ICD-10-CM

## 2014-10-19 DIAGNOSIS — Z7982 Long term (current) use of aspirin: Secondary | ICD-10-CM | POA: Diagnosis not present

## 2014-10-19 MED ORDER — IOHEXOL 240 MG/ML SOLN
25.0000 mL | INTRAMUSCULAR | Status: AC
  Administered 2014-10-19 (×2): 25 mL via ORAL

## 2014-10-19 MED ORDER — HYDROCODONE-ACETAMINOPHEN 5-325 MG PO TABS: 1.0000 | ORAL_TABLET | ORAL | Status: DC | PRN

## 2014-10-19 MED ORDER — IOHEXOL 350 MG/ML SOLN
150.0000 mL | Freq: Once | INTRAVENOUS | Status: AC | PRN
  Administered 2014-10-19: 150 mL via INTRAVENOUS

## 2014-10-19 MED ORDER — MORPHINE SULFATE 2 MG/ML IJ SOLN: 2.0000 mg | INTRAMUSCULAR | Status: DC | PRN

## 2014-10-19 MED ORDER — CIPROFLOXACIN HCL 500 MG PO TABS
500.0000 mg | ORAL_TABLET | Freq: Two times a day (BID) | ORAL | Status: DC
  Administered 2014-10-19: 500 mg via ORAL
  Filled 2014-10-19: qty 1

## 2014-10-19 MED ORDER — HYDROCHLOROTHIAZIDE 12.5 MG PO CAPS
12.5000 mg | ORAL_CAPSULE | Freq: Two times a day (BID) | ORAL | Status: DC
  Administered 2014-10-19: 12.5 mg via ORAL
  Filled 2014-10-19: qty 1

## 2014-10-19 MED ORDER — ONDANSETRON HCL 4 MG/2ML IJ SOLN
4.0000 mg | Freq: Four times a day (QID) | INTRAMUSCULAR | Status: DC | PRN
  Administered 2014-10-20: 4 mg via INTRAVENOUS

## 2014-10-19 MED ORDER — LISINOPRIL 20 MG PO TABS
20.0000 mg | ORAL_TABLET | Freq: Two times a day (BID) | ORAL | Status: DC
  Administered 2014-10-19: 20 mg via ORAL
  Filled 2014-10-19: qty 1

## 2014-10-19 MED ORDER — KCL IN DEXTROSE-NACL 20-5-0.2 MEQ/L-%-% IV SOLN
INTRAVENOUS | Status: DC
  Administered 2014-10-19 – 2014-10-20 (×2): via INTRAVENOUS
  Filled 2014-10-19 (×7): qty 1000

## 2014-10-19 MED ORDER — PANTOPRAZOLE SODIUM 40 MG PO TBEC: 40.0000 mg | DELAYED_RELEASE_TABLET | Freq: Every day | ORAL | Status: DC

## 2014-10-19 NOTE — Progress Notes (Addendum)
A user error has taken place: encounter opened in error, closed for administrative reasons.

## 2014-10-19 NOTE — Progress Notes (Signed)
He is now in hospital bed, drinking CT oral contrast.  Still having pain.   To have CT to determine if he has diverticulitis..  Anticipating umbilical hernia repair for tomorrow.

## 2014-10-20 ENCOUNTER — Encounter
Admission: AD | Disposition: A | Discharge status: Home / Self Care | Payer: Self-pay | Source: Ambulatory Visit | Attending: Surgery

## 2014-10-20 ENCOUNTER — Inpatient Hospital Stay: Payer: 59 | Admitting: Anesthesiology

## 2014-10-20 HISTORY — PX: UMBILICAL HERNIA REPAIR: SHX196

## 2014-10-20 LAB — GLUCOSE, CAPILLARY: Glucose-Capillary: 157 mg/dL — ABNORMAL HIGH (ref 70–99)

## 2014-10-20 SURGERY — REPAIR, HERNIA, UMBILICAL, ADULT
Anesthesia: General

## 2014-10-20 MED ORDER — BUPIVACAINE-EPINEPHRINE (PF) 0.5% -1:200000 IJ SOLN
INTRAMUSCULAR | Status: AC
  Filled 2014-10-20: qty 30

## 2014-10-20 MED ORDER — ONDANSETRON HCL 4 MG/2ML IJ SOLN: 4.0000 mg | Freq: Once | INTRAMUSCULAR | Status: DC | PRN

## 2014-10-20 MED ORDER — ROCURONIUM BROMIDE 100 MG/10ML IV SOLN
INTRAVENOUS | Status: DC | PRN
  Administered 2014-10-20: 10 mg via INTRAVENOUS
  Administered 2014-10-20: 20 mg via INTRAVENOUS

## 2014-10-20 MED ORDER — MIDAZOLAM HCL 2 MG/2ML IJ SOLN
INTRAMUSCULAR | Status: DC | PRN
  Administered 2014-10-20: 2 mg via INTRAVENOUS

## 2014-10-20 MED ORDER — SUCCINYLCHOLINE CHLORIDE 20 MG/ML IJ SOLN
INTRAMUSCULAR | Status: DC | PRN
  Administered 2014-10-20: 140 mg via INTRAVENOUS

## 2014-10-20 MED ORDER — DEXAMETHASONE SODIUM PHOSPHATE 4 MG/ML IJ SOLN
INTRAMUSCULAR | Status: DC | PRN
  Administered 2014-10-20: 5 mg via INTRAVENOUS

## 2014-10-20 MED ORDER — FENTANYL CITRATE (PF) 100 MCG/2ML IJ SOLN
INTRAMUSCULAR | Status: DC | PRN
  Administered 2014-10-20: 50 ug via INTRAVENOUS
  Administered 2014-10-20: 100 ug via INTRAVENOUS

## 2014-10-20 MED ORDER — HYDROMORPHONE HCL 1 MG/ML IJ SOLN: 0.2500 mg | INTRAMUSCULAR | Status: DC | PRN

## 2014-10-20 MED ORDER — BUPIVACAINE-EPINEPHRINE 0.5% -1:200000 IJ SOLN
INTRAMUSCULAR | Status: DC | PRN
  Administered 2014-10-20: 8 mL

## 2014-10-20 MED ORDER — PROPOFOL 10 MG/ML IV BOLUS
INTRAVENOUS | Status: DC | PRN
  Administered 2014-10-20: 200 mg via INTRAVENOUS

## 2014-10-20 MED ORDER — SODIUM CHLORIDE 0.9 % IV SOLN
INTRAVENOUS | Status: DC | PRN
Start: 2014-10-20 — End: 2014-10-20
  Administered 2014-10-20: 08:00:00 via INTRAVENOUS

## 2014-10-20 MED ORDER — LIDOCAINE HCL (CARDIAC) 20 MG/ML IV SOLN
INTRAVENOUS | Status: DC | PRN
  Administered 2014-10-20: 50 mg via INTRAVENOUS

## 2014-10-20 SURGICAL SUPPLY — 24 items
BLADE CLIPPER SURG (BLADE) ×2 IMPLANT
BLADE SURG 15 STRL LF DISP TIS (BLADE) ×1 IMPLANT
BLADE SURG 15 STRL SS (BLADE) ×2
CANISTER SUCT 1200ML W/VALVE (MISCELLANEOUS) IMPLANT
CHLORAPREP W/TINT 26ML (MISCELLANEOUS) ×2 IMPLANT
DRAPE PED LAPAROTOMY (DRAPES) ×2 IMPLANT
GLOVE BIO SURGEON STRL SZ7.5 (GLOVE) ×6 IMPLANT
GOWN STRL REUS W/ TWL LRG LVL3 (GOWN DISPOSABLE) ×2 IMPLANT
GOWN STRL REUS W/TWL LRG LVL3 (GOWN DISPOSABLE) ×4
KIT RM TURNOVER STRD PROC AR (KITS) ×2 IMPLANT
LABEL OR SOLS (LABEL) ×2 IMPLANT
LIQUID BAND (GAUZE/BANDAGES/DRESSINGS) ×2 IMPLANT
NDL SAFETY 25GX1.5 (NEEDLE) ×2 IMPLANT
NS IRRIG 500ML POUR BTL (IV SOLUTION) ×2 IMPLANT
PACK BASIN MINOR ARMC (MISCELLANEOUS) ×2 IMPLANT
PAD GROUND ADULT SPLIT (MISCELLANEOUS) ×2 IMPLANT
SET INTRO CAPELLA COAXIAL (SET/KITS/TRAYS/PACK) IMPLANT
STRIP CLOSURE SKIN 1/2X4 (GAUZE/BANDAGES/DRESSINGS) IMPLANT
SUT CHROMIC 3 0 SH 27 (SUTURE) IMPLANT
SUT CHROMIC 4 0 RB 1X27 (SUTURE) IMPLANT
SUT MNCRL+ 5-0 UNDYED PC-3 (SUTURE) ×1 IMPLANT
SUT MONOCRYL 5-0 (SUTURE) ×1
SUT SURGILON 0 BLK (SUTURE) ×2 IMPLANT
SYRINGE 10CC LL (SYRINGE) ×2 IMPLANT

## 2014-10-20 NOTE — OR Nursing (Signed)
Report given to Marilynne DriversLeigh Miles RN   [facility]oc[facility] Dania Marsan RN

## 2014-10-20 NOTE — Discharge Instructions (Signed)
May shower. Avoid straining and heavy lifting for 1 month. Take Tylenol or oxycodone if needed for pain  Follow-up in the office in 2 weeks

## 2014-10-20 NOTE — Anesthesia Postprocedure Evaluation (Signed)
  Anesthesia Post-op Note  Patient: Jeffrey [name]  Procedure(s) Performed: Procedure(s): HERNIA REPAIR UMBILICAL ADULT (N/A)  Anesthesia type:General  Patient location: PACU  Post pain: Pain level controlled  Post assessment: Post-op Vital signs reviewed, Patient's Cardiovascular Status Stable, Respiratory Function Stable, Patent Airway and No signs of Nausea or vomiting  Post vital signs: Reviewed and stable  Last Vitals:  Filed Vitals:   10/20/14 0952  BP:   Pulse:   Temp: 36.7 C  Resp:     Level of consciousness: awake, alert  and patient cooperative  Complications: No apparent anesthesia complications

## 2014-10-20 NOTE — Anesthesia Preprocedure Evaluation (Addendum)
Anesthesia Evaluation  Patient identified by MRN, date of birth, ID band Patient awake    Reviewed: Allergy & Precautions, NPO status , Patient's Chart, lab work & pertinent test results  History of Anesthesia Complications Negative for: history of anesthetic complications  Airway Mallampati: III  TM Distance: >3 FB Neck ROM: Full    Dental no notable dental hx.    Pulmonary sleep apnea and Continuous Positive Airway Pressure Ventilation , Current Smoker,  breath sounds clear to auscultation  Pulmonary exam normal       Cardiovascular Exercise Tolerance: Good hypertension, Pt. on medications Normal cardiovascular examRhythm:Regular Rate:Normal     Neuro/Psych PSYCHIATRIC DISORDERS Depression negative neurological ROS     GI/Hepatic Neg liver ROS, GERD-  Medicated and Controlled,  Endo/Other  diabetes, Well Controlled, Type 2  Renal/GU negative Renal ROS  negative genitourinary   Musculoskeletal negative musculoskeletal ROS (+)   Abdominal   Peds negative pediatric ROS (+)  Hematology negative hematology ROS (+)   Anesthesia Other Findings Smokes 0.5ppd  Reproductive/Obstetrics negative OB ROS                           Anesthesia Physical Anesthesia Plan  ASA: III  Anesthesia Plan: General   Post-op Pain Management:    Induction: Intravenous  Airway Management Planned: Oral ETT  Additional Equipment:   Intra-op Plan:   Post-operative Plan: Extubation in OR  Informed Consent: I have reviewed the patients History and Physical, chart, labs and discussed the procedure including the risks, benefits and alternatives for the proposed anesthesia with the patient or authorized representative who has indicated his/her understanding and acceptance.     Plan Discussed with: CRNA and Surgeon  Anesthesia Plan Comments:         Anesthesia Quick Evaluation

## 2014-10-20 NOTE — Discharge Summary (Signed)
  Discharge summary  Diagnosis incarcerated umbilical hernia.  Operation umbilical hernia repair  This 54 year old male was admitted acutely with moderately severe umbilical pain with a nonreducible hernia. He also had left lower quadrant pain and tenderness.  Details of past medical history and physical findings are found on the typed H&P  Patient was admitted emergently and had a CT scan of the abdomen and pelvis which demonstrated the incarcerated umbilical hernia containing fat. There was no finding of diverticulitis.  He was carried to the operating room and had umbilical hernia repair  Postoperatively he is progressing satisfactorily. Vital signs are stable. His wound appears to be healing satisfactorily  Discharge instructions were given. Plan is to follow-up in the office in 2 weeks

## 2014-10-20 NOTE — Progress Notes (Signed)
He is still having pain at the umbilicus this morning. Minimal discomfort in the left lower quadrant.  CT scan demonstrated no evidence of diverticulitis. The hernia contains fatty tissues but no intestine.  Vital signs stable  Due to continued pain plan is to repair his umbilical hernia today as discussed

## 2014-10-20 NOTE — Anesthesia Procedure Notes (Signed)
Procedure Name: Intubation Date/Time: 10/20/2014 8:22 AM Performed by: Sherron FlemingsHARVEY, Panfilo Ketchum Pre-anesthesia Checklist: Patient identified, Emergency Drugs available, Suction available, Patient being monitored and Timeout performed Patient Re-evaluated:Patient Re-evaluated prior to inductionOxygen Delivery Method: Circle system utilized Preoxygenation: Pre-oxygenation with 100% oxygen Intubation Type: IV induction and Rapid sequence Ventilation: Oral airway inserted - appropriate to patient size and Two handed mask ventilation required Laryngoscope Size: Glidescope and 3 Grade View: Grade IV Tube type: Oral Tube size: 7.5 mm Number of attempts: 2 Airway Equipment and Method: Stylet,  Oral airway and Video-laryngoscopy Placement Confirmation: ETT inserted through vocal cords under direct vision,  CO2 detector,  positive ETCO2 and breath sounds checked- equal and bilateral Secured at: 23 cm Tube secured with: Tape Dental Injury: Teeth and Oropharynx as per pre-operative assessment  Difficulty Due To: Difficulty was unanticipated, Difficult Airway- due to anterior larynx, Difficult Airway-  due to edematous airway and Difficult Airway- due to large tongue Future Recommendations: Recommend- induction with short-acting agent, and alternative techniques readily available

## 2014-10-20 NOTE — Op Note (Signed)
Operative report  Preoperative diagnosis incarcerated umbilical hernia  Postoperative diagnosis incarcerated umbilical hernia  Procedure umbilical hernia repair  Surgeon Renda RollsWilton Kenita Bines  Anesthesia Gen.  Indications: This 54 year old male presented with a painful bulge at the umbilicus. An incarcerated umbilical hernia was identified on physical exam and repair is recommended for definitive treatment  The patient was placed on the operating table in the supine position under general endotracheal anesthesia. The abdomen was clipped and prepared with ChloraPrep solution and draped in a sterile manner  A transversely oriented supraumbilical curvilinear incision was made approximately 5 cm in length. This was carried down through the thin layer of subcutaneous tissues. Several small bleeding points were cauterized. There was an umbilical hernia sac which was dissected free from surrounding structures and dissected down to the fascial ring defect. There was another hernia sac on the right side which was smaller and was also dissected free from surrounding structures. There was a small bridge of fascia between the 2 defects which was divided. The tissue within the hernia sac appeared to be viable. The fascial defect was enlarged on the left side to allow reduction of the hernia. It was completely reduced. The fascial ring defect was further demonstrated and was approximately 2 cm in transverse dimension. The repair was carried out with a row of 0 Surgilon figure-of-eight sutures with a transversely oriented suture line. Following this hemostasis was intact. Subcutaneous tissues were infiltrated with half percent Sensorcaine with epinephrine. The skin of the umbilicus was sutured to the deep fascia with 5-0 Monocryl. Additional subcutaneous tissues were approximated with 5-0 Monocryl. The skin was closed with a running 5-0 Monocryl subcuticular suture and LiquiBand  The patient tolerated the procedure  satisfactorily and was then prepared for transfer to the recovery room.  Renda RollsWilton Mayar Whittier M.D.

## 2014-10-20 NOTE — Transfer of Care (Signed)
Immediate Anesthesia Transfer of Care Note  Patient: Jeffrey [name]  Procedure(s) Performed: Procedure(s): HERNIA REPAIR UMBILICAL ADULT (N/A)  Patient Location: PACU  Anesthesia Type:General  Level of Consciousness: Alert, Awake, Oriented  Airway & Oxygen Therapy: Patient Spontanous Breathing  Post-op Assessment: Report given to RN  Post vital signs: Reviewed and stable  Last Vitals:  Filed Vitals:   10/20/14 0949  BP: 134/82  Pulse: 92  Temp: 36.7 C  Resp: 12    Complications: No apparent anesthesia complications

## 2014-10-23 ENCOUNTER — Encounter: Payer: Self-pay | Admitting: Surgery

## 2015-03-19 ENCOUNTER — Other Ambulatory Visit: Payer: Self-pay | Admitting: Physician Assistant

## 2015-03-19 DIAGNOSIS — E119 Type 2 diabetes mellitus without complications: Secondary | ICD-10-CM

## 2015-03-19 DIAGNOSIS — N521 Erectile dysfunction due to diseases classified elsewhere: Secondary | ICD-10-CM

## 2015-03-19 DIAGNOSIS — E1169 Type 2 diabetes mellitus with other specified complication: Secondary | ICD-10-CM

## 2015-03-19 MED ORDER — GLIPIZIDE ER 5 MG PO TB24: 5.0000 mg | ORAL_TABLET | Freq: Every morning | ORAL | Status: DC

## 2015-03-19 MED ORDER — TADALAFIL 10 MG PO TABS: 10.0000 mg | ORAL_TABLET | Freq: Every day | ORAL | Status: DC | PRN

## 2015-07-03 DIAGNOSIS — G4733 Obstructive sleep apnea (adult) (pediatric): Secondary | ICD-10-CM | POA: Diagnosis not present

## 2015-08-07 DIAGNOSIS — H5213 Myopia, bilateral: Secondary | ICD-10-CM | POA: Diagnosis not present

## 2015-08-07 DIAGNOSIS — H52223 Regular astigmatism, bilateral: Secondary | ICD-10-CM | POA: Diagnosis not present

## 2015-08-07 DIAGNOSIS — H524 Presbyopia: Secondary | ICD-10-CM | POA: Diagnosis not present

## 2015-08-09 ENCOUNTER — Encounter: Payer: Self-pay | Admitting: Physician Assistant

## 2015-08-09 ENCOUNTER — Ambulatory Visit: Payer: Self-pay | Admitting: Physician Assistant

## 2015-08-09 DIAGNOSIS — R059 Cough, unspecified: Secondary | ICD-10-CM

## 2015-08-09 DIAGNOSIS — R05 Cough: Secondary | ICD-10-CM

## 2015-08-09 LAB — POCT INFLUENZA A/B
INFLUENZA A, POC: NEGATIVE
INFLUENZA B, POC: POSITIVE — AB

## 2015-08-09 MED ORDER — HYDROCOD POLST-CPM POLST ER 10-8 MG/5ML PO SUER: 5.0000 mL | Freq: Two times a day (BID) | ORAL | Status: DC | PRN

## 2015-08-09 MED ORDER — OSELTAMIVIR PHOSPHATE 75 MG PO CAPS: 75.0000 mg | ORAL_CAPSULE | Freq: Two times a day (BID) | ORAL | Status: DC

## 2015-08-09 NOTE — Progress Notes (Signed)
S: C/o runny nose and congestion with harsh cough for 3 days, no fever, chills, cp/sob, v/d; bodyaches started yesterday Using otc meds: robitussin  O: PE: perrl eomi, normocephalic, tms dull, nasal mucosa red and swollen, throat injected, neck supple no lymph, lungs c t a, cv rrr, neuro intact, flu swab +b  A:  Influenza B  P: tamiflu, tussionex; nrdrink fluids, continue regular meds , use otc meds of choice, return if not improving in 5 days, return earlier if worsening

## 2015-08-30 DIAGNOSIS — Z0000 Encounter for general adult medical examination without abnormal findings: Secondary | ICD-10-CM | POA: Diagnosis not present

## 2015-08-30 DIAGNOSIS — E291 Testicular hypofunction: Secondary | ICD-10-CM | POA: Diagnosis not present

## 2015-08-30 DIAGNOSIS — Z125 Encounter for screening for malignant neoplasm of prostate: Secondary | ICD-10-CM | POA: Diagnosis not present

## 2015-08-30 DIAGNOSIS — E119 Type 2 diabetes mellitus without complications: Secondary | ICD-10-CM | POA: Diagnosis not present

## 2015-08-30 DIAGNOSIS — Z79899 Other long term (current) drug therapy: Secondary | ICD-10-CM | POA: Diagnosis not present

## 2015-09-03 ENCOUNTER — Encounter: Payer: Self-pay | Admitting: *Deleted

## 2015-09-03 ENCOUNTER — Encounter: Payer: 59 | Attending: Family Medicine | Admitting: *Deleted

## 2015-09-03 VITALS — BP 130/82 | Ht 69.0 in | Wt 283.1 lb

## 2015-09-03 DIAGNOSIS — E119 Type 2 diabetes mellitus without complications: Secondary | ICD-10-CM | POA: Insufficient documentation

## 2015-09-03 NOTE — Patient Instructions (Addendum)
Check blood sugars 2 x day before breakfast and 2 hrs after supper every day Exercise: Walk on treadmill for  15  minutes   3  days a week and gradually increase to 30 minutes 5 x week  Eat 3 meals day,  1-2 snacks a day Space meals 4-6 hours apart Don't skip meals Drink plenty of water Avoid sugar sweetened drinks ( juices) Bring blood sugar records to the next class Call your doctor for a prescription for:  1. Meter strips (type) True Metrix    checking  2 times per day  2. Lancets (type) True Metrix  checking  2     times per day

## 2015-09-04 NOTE — Progress Notes (Signed)
Diabetes Self-Management Education  Visit Type: First/Initial  Appt. Start Time: 1535 Appt. End Time: 1700  09/04/2015  [name], identified by name and date of birth, is a 55 y.o. male with a diagnosis of Diabetes: Type 2.   ASSESSMENT  Blood pressure 130/82, height  (1.753 m), weight 283 lb 1.6 oz (128.413 kg). Body mass index is 41.79 kg/(m^2).      Diabetes Self-Management Education - 09/03/15 1740    Visit Information   Visit Type First/Initial   Initial Visit   Diabetes Type Type 2   Are you currently following a meal plan? No  decreasing sugar intake   Are you taking your medications as prescribed? Yes   Date Diagnosed 2 1/2 years ago   Health Coping   How would you rate your overall health? Fair   Psychosocial Assessment   Patient Belief/Attitude about Diabetes Motivated to manage diabetes  "need to have better control over it"   Self-care barriers None   Self-management support Doctor's office;Friends   Patient Concerns Nutrition/Meal planning;Glycemic Control;Problem Solving;Weight Control;Healthy Lifestyle   Special Needs None   Preferred Learning Style Auditory;Visual;Hands on   Learning Readiness Ready   How often do you need to have someone help you when you read instructions, pamphlets, or other written materials from your doctor or pharmacy? 1 - Never   What is the last grade level you completed in school? Assoc degree in Nursing   Complications   Last HgB A1C per patient/outside source 8.4 %  Pt reports results from recent labs. No results in EPIC.   How often do you check your blood sugar? 3-4 times / week  Pt reports that he tests when he feels bad and readings are usually 200's mg/dL. Provided True Metrix meter which is covered on his insurance and instructed on use. BG upon return demonstration was 145 mg/dL at 1:61 pm - 2 hrs pp.    Have you had a dilated eye exam in the past 12 months? Yes   Have you had a dental exam in the past 12  months? Yes   Are you checking your feet? Yes   How many days per week are you checking your feet? 7   Dietary Intake   Breakfast skips or has granola bar; bacon egg and cheese sandwich   Lunch leftovers from supper or fast food   Dinner meat, veggies, pasat, bread, potatoes, rice   Beverage(s) water, diet soda   Exercise   Exercise Type ADL's  "sporadic" walking on treadmill   Patient Education   Previous Diabetes Education Yes (please comment)  in nursing school   Disease state  Explored patient's options for treatment of their diabetes   Nutrition management  Role of diet in the treatment of diabetes and the relationship between the three main macronutrients and blood glucose level   Physical activity and exercise  Role of exercise on diabetes management, blood pressure control and cardiac health.   Medications Reviewed patients medication for diabetes, action, purpose, timing of dose and side effects.  Pt to start Invokana   Monitoring Taught/evaluated SMBG meter.;Purpose and frequency of SMBG.;Identified appropriate SMBG and/or A1C goals.   Chronic complications Relationship between chronic complications and blood glucose control   Psychosocial adjustment Identified and addressed patients feelings and concerns about diabetes;Role of stress on diabetes   Personal strategies to promote health Review risk of smoking and offered smoking cessation   Individualized Goals (developed by patient)   Reducing Risk  Improve blood sugars Prevent diabetes complications Lose weight Lead a healthier lifestyle   Outcomes   Expected Outcomes Demonstrated interest in learning. Expect positive outcomes      Individualized Plan for Diabetes Self-Management Training:   Learning Objective:  Patient will have a greater understanding of diabetes self-management. Patient education plan is to attend individual and/or group sessions per assessed needs and concerns.   Plan:   Patient Instructions   Check blood sugars 2 x day before breakfast and 2 hrs after supper every day Exercise: Walk on treadmill for  15  minutes   3  days a week and gradually increase to 30 minutes 5 x week  Eat 3 meals day,  1-2 snacks a day Space meals 4-6 hours apart Don't skip meals Drink plenty of water Avoid sugar sweetened drinks ( juices) Bring blood sugar records to the next class Call your doctor for a prescription for:  1. Meter strips (type) True Metrix    checking  2 times per day  2. Lancets (type) True Metrix  checking  2     times per day   Expected Outcomes:  Demonstrated interest in learning. Expect positive outcomes  Education material provided:  General Meal Planning Guidelines Simple Meal Plan Meter - True Metrix  If problems or questions, patient to contact team via:   Sharion SettlerSheila Rosamary Boudreau, RN, CCM, CDE [phone]  Future DSME appointment:  Monday September 23, 2015 for Diabetes Class 1

## 2015-09-23 ENCOUNTER — Encounter: Payer: Self-pay | Admitting: Dietician

## 2015-09-23 ENCOUNTER — Encounter: Payer: 59 | Attending: Family Medicine | Admitting: Dietician

## 2015-09-23 VITALS — Ht 69.0 in | Wt 282.1 lb

## 2015-09-23 DIAGNOSIS — E119 Type 2 diabetes mellitus without complications: Secondary | ICD-10-CM | POA: Diagnosis not present

## 2015-09-23 NOTE — Progress Notes (Signed)

## 2015-09-30 ENCOUNTER — Encounter: Payer: Self-pay | Admitting: Dietician

## 2015-09-30 ENCOUNTER — Encounter: Payer: 59 | Admitting: Dietician

## 2015-09-30 VITALS — Wt 279.5 lb

## 2015-09-30 DIAGNOSIS — E119 Type 2 diabetes mellitus without complications: Secondary | ICD-10-CM

## 2015-09-30 NOTE — Progress Notes (Signed)

## 2015-10-03 ENCOUNTER — Other Ambulatory Visit: Payer: Self-pay | Admitting: Physician Assistant

## 2015-10-03 NOTE — Telephone Encounter (Signed)
Med refill approved, hx of reflux, takes prn

## 2015-10-07 ENCOUNTER — Encounter: Payer: Self-pay | Admitting: Dietician

## 2015-10-07 ENCOUNTER — Encounter: Payer: 59 | Admitting: Dietician

## 2015-10-07 VITALS — BP 140/90 | Ht 69.0 in | Wt 277.3 lb

## 2015-10-07 DIAGNOSIS — E119 Type 2 diabetes mellitus without complications: Secondary | ICD-10-CM | POA: Diagnosis not present

## 2015-10-07 NOTE — Progress Notes (Signed)

## 2015-10-16 ENCOUNTER — Encounter: Payer: Self-pay | Admitting: *Deleted

## 2015-11-19 DIAGNOSIS — E119 Type 2 diabetes mellitus without complications: Secondary | ICD-10-CM | POA: Diagnosis not present

## 2015-11-19 DIAGNOSIS — Z79899 Other long term (current) drug therapy: Secondary | ICD-10-CM | POA: Diagnosis not present

## 2015-11-27 ENCOUNTER — Ambulatory Visit: Payer: Self-pay

## 2015-11-28 DIAGNOSIS — E119 Type 2 diabetes mellitus without complications: Secondary | ICD-10-CM | POA: Diagnosis not present

## 2015-11-28 DIAGNOSIS — K219 Gastro-esophageal reflux disease without esophagitis: Secondary | ICD-10-CM | POA: Diagnosis not present

## 2015-11-28 DIAGNOSIS — I10 Essential (primary) hypertension: Secondary | ICD-10-CM | POA: Diagnosis not present

## 2015-11-28 DIAGNOSIS — G4733 Obstructive sleep apnea (adult) (pediatric): Secondary | ICD-10-CM | POA: Diagnosis not present

## 2015-12-09 ENCOUNTER — Other Ambulatory Visit: Payer: Self-pay

## 2015-12-09 VITALS — BP 112/78 | HR 60 | Wt 272.3 lb

## 2015-12-09 DIAGNOSIS — E119 Type 2 diabetes mellitus without complications: Secondary | ICD-10-CM

## 2015-12-10 ENCOUNTER — Ambulatory Visit: Payer: Self-pay

## 2015-12-10 NOTE — Patient Outreach (Signed)
[facility]) Care Management  [facility]  12/10/2015   Jeffrey ChildDavid Anthony [name] 11/08/1960 [id number]  Subjective: Patient in for his initial Link to Wellness Visit diabetes visit. He reports he has gone from an A1C of 8.6% to a current A1C of 6.3% (11/19/15).  Jeffrey [name] has had diabetes for a few years but work pressure and home pressure caused him to get an elevated A1C and he decided it was time to take care of himself.  He tells me he's made remarkable changes to his diet and has changed from Glucophage (caused severe abdominal pain) and Glipizide (caused hypoglycemia) to Invokana - he has not had any side effects of this medication.  He does not exercise though he's active at home.  He does smoke and is not ready to quit although he doesn't know why.   Objective:  Filed Vitals:   12/09/15 1705  BP: 112/78  Pulse: 60  Weight: 272 lb 4.8 oz (123.514 kg)  SpO2: 98%     Encounter Medications:  Outpatient Encounter Prescriptions as of 12/09/2015  Medication Sig Note  . canagliflozin (INVOKANA) 300 MG TABS tablet Take 1 tablet by mouth daily. 09/03/2015: [facility] Received Sig: Take by mouth.  Marland Kitchen. lisinopril (PRINIVIL,ZESTRIL) 20 MG tablet Take 20 mg by mouth daily.   . tadalafil (CIALIS) 10 MG tablet Take 1 tablet (10 mg total) by mouth daily as needed for erectile dysfunction.   Marland Kitchen. aspirin 81 MG tablet Take 81 mg by mouth daily. Reported on 12/09/2015   . chlorpheniramine-HYDROcodone (TUSSIONEX PENNKINETIC ER) 10-8 MG/5ML SUER Take 5 mLs by mouth every 12 (twelve) hours as needed for cough. (Patient not taking: Reported on 12/09/2015)   . glipiZIDE (GLUCOTROL XL) 5 MG 24 hr tablet Take 1 tablet (5 mg total) by mouth every morning. (Patient not taking: Reported on 09/23/2015)   . lisinopril-hydrochlorothiazide (PRINZIDE,ZESTORETIC) 20-12.5 MG tablet Take 1 tablet by mouth daily. Reported on 12/09/2015 03/19/2015: [facility]  . omeprazole (PRILOSEC) 20 MG capsule TAKE 1 CAPSULE BY MOUTH ONCE A DAY   . oxyCODONE (OXY IR/ROXICODONE) 5 MG immediate release tablet Take 2 tablets (10 mg total) by mouth every 4 (four) hours as needed for severe pain. (Patient not taking: Reported on 12/09/2015)    No facility-administered encounter medications on file as of 12/09/2015.    Functional Status:  In your present state of health, do you have any difficulty performing the following activities: 12/09/2015  Hearing? N  Vision? N  Difficulty concentrating or making decisions? N  Walking or climbing stairs? N  Dressing or bathing? N  Doing errands, shopping? N    Fall/Depression Screening: PHQ 2/9 Scores 12/09/2015 09/03/2015  PHQ - 2 Score 2 6  PHQ- 9 Score 4 12    Assessment: Patient is engaged in his care. He has gone from 305lbs and is currently 272.3lbs. He has tremendous amount of stress at home and at work. He is using the employee assistance program to help manage stress.   Plan: For long term health, ideally Jeffrey [name] should try to add "exercise" to his life plan and stop smoking. He needs to check blood sugars a minimum of 3 times per week.   [facility] CM Care Plan Problem One        Most Recent Value   Care Plan Problem One  potential for elevated blood sugars    Role Documenting the Problem One  Care Management Coordinator  Care Plan for Problem One  Active   THN Long Term Goal (31-90 days)  Patient will maintain A1C less than 7% when checked by health care provider in 3-6 months.   THN Long Term Goal Start Date  12/09/15   Interventions for Problem One Long Term Goal  1. limit dietary intake to one plate full- no seconds unless it is "free" vegetables.  2. check sugars a minimum of three times per week  3. limit fast food to once a week     I will follow up with Jeffrey [name] in a couple of months.   Jeffrey RacerJulie Bobbette Eakes, RN, BA, MHA, CDE [facility] Diabetes Coordinator- Link To [employer] Direct Dial:   [phone]  Fax:  [phone] E-mail: [email or web address] [address], [place], [place]  [id number]

## 2015-12-30 DIAGNOSIS — H66002 Acute suppurative otitis media without spontaneous rupture of ear drum, left ear: Secondary | ICD-10-CM | POA: Diagnosis not present

## 2016-01-07 DIAGNOSIS — G4733 Obstructive sleep apnea (adult) (pediatric): Secondary | ICD-10-CM | POA: Diagnosis not present

## 2016-01-17 DIAGNOSIS — J301 Allergic rhinitis due to pollen: Secondary | ICD-10-CM | POA: Diagnosis not present

## 2016-04-07 ENCOUNTER — Other Ambulatory Visit: Payer: Self-pay

## 2016-04-07 VITALS — BP 122/78 | Ht 70.0 in | Wt 268.0 lb

## 2016-04-07 DIAGNOSIS — E119 Type 2 diabetes mellitus without complications: Secondary | ICD-10-CM

## 2016-04-07 NOTE — Patient Outreach (Signed)
Jeffrey [name]) Care Management  [place]  04/07/2016   Jeffrey [name] August 28, 1960 [id number]  Subjective: Met with Jeffrey [name] today for the Link to Wellness diabetes program.  Jeffrey [name] has no blood sugar today because he has not been checking blood sugars. The last time he checked was 2 weeks ago and the fasting blood sugar was in the 120's.  He reports 1 episode of low blood sugar in the last month.  Jeffrey [name] is overwhelmed by chaos in his home and in his workplace and it has created an environment where he's not caring for himself.  He does not exercise, is no longer packing lunches or preparing meals at home. He is eating out for most meals but is making wiser choices, eating smaller portions and is no longer drinking sweetened beverages.   Objective:  Vitals:   04/07/16 0947  BP: 122/78  Weight: 268 lb (121.6 kg)  Height: 1.778 m (5' 10" )     Encounter Medications:  Outpatient Encounter Prescriptions as of 04/07/2016  Medication Sig Note  . aspirin 81 MG tablet Take 81 mg by mouth daily. Reported on 12/09/2015   . canagliflozin (INVOKANA) 300 MG TABS tablet Take 1 tablet by mouth daily. 09/03/2015: Received from: [place]: Take by mouth.  Marland Kitchen lisinopril (PRINIVIL,ZESTRIL) 20 MG tablet Take 20 mg by mouth daily.   Marland Kitchen omeprazole (PRILOSEC) 20 MG capsule TAKE 1 CAPSULE BY MOUTH ONCE A DAY   . tadalafil (CIALIS) 10 MG tablet Take 1 tablet (10 mg total) by mouth daily as needed for erectile dysfunction.   . chlorpheniramine-HYDROcodone (TUSSIONEX PENNKINETIC ER) 10-8 MG/5ML SUER Take 5 mLs by mouth every 12 (twelve) hours as needed for cough. (Patient not taking: Reported on 04/07/2016)   . glipiZIDE (GLUCOTROL XL) 5 MG 24 hr tablet Take 1 tablet (5 mg total) by mouth every morning. (Patient not taking: Reported on 04/07/2016)   . lisinopril-hydrochlorothiazide (PRINZIDE,ZESTORETIC) 20-12.5 MG tablet Take 1 tablet by mouth daily. Reported on  12/09/2015 03/19/2015: Received from: [place]  . oxyCODONE (OXY IR/ROXICODONE) 5 MG immediate release tablet Take 2 tablets (10 mg total) by mouth every 4 (four) hours as needed for severe pain. (Patient not taking: Reported on 04/07/2016)    No facility-administered encounter medications on file as of 04/07/2016.     Functional Status:  In your present state of health, do you have any difficulty performing the following activities: 12/09/2015  Hearing? N  Vision? N  Difficulty concentrating or making decisions? N  Walking or climbing stairs? N  Dressing or bathing? N  Doing errands, shopping? N  Some recent data might be hidden    Fall/Depression Screening: PHQ 2/9 Scores 04/07/2016 12/09/2015 09/03/2015  PHQ - 2 Score 0 2 6  PHQ- 9 Score - 4 12    Assessment: Jeffrey [name] reveals immense chaos in his home and workplace.  I praised him for continuing to eat smaller portions, for making wiser food choices at take out, for only drinking unsweetened drinks, for eating 2 meals and 3 snacks per day, for setting limits with his family and for not increasing his cigarette usage.  We discussed the importance of setting limits and the impact stress is making on his body.  I praised Jeffrey [name] for his ongoing weight loss.  We discussed the importance of checking blood sugars.  Plan:  [facility] CM Care Plan Problem One   Flowsheet Row Most Recent Value  Care Plan Problem One  potential for elevated blood sugars   Role Documenting the Problem One  Care Management [place] for Problem One  Active  [facility]N Long Term Goal (31-90 days)  Patient will maintain A1C less than 7% when checked by health care provider in 3-6 months.  [facility]N Long Term Goal Start Date  12/09/15  Interventions for Problem One Long Term Goal  1. try to check suagrs three times a week  2. continue to limit dietary intake, eat 3 meals and 2 snacks per day 3. register in [place] for 2018 for Link to Wellness      Follow  up with Jeffrey [name] in January.     Jeffrey Fitz, RN, BA, [place], [place] Direct Dial:  [phone]  Fax:  [phone] E-mail: [email or web address] [address] [place],   [address]

## 2016-04-16 ENCOUNTER — Other Ambulatory Visit: Payer: Self-pay | Admitting: Physician Assistant

## 2016-04-27 ENCOUNTER — Encounter: Payer: Self-pay | Admitting: *Deleted

## 2016-05-17 ENCOUNTER — Ambulatory Visit (HOSPITAL_COMMUNITY)
Admission: EM | Admit: 2016-05-17 | Discharge: 2016-05-17 | Disposition: A | Discharge status: Home / Self Care | Payer: 59 | Attending: Emergency Medicine | Admitting: Emergency Medicine

## 2016-05-17 ENCOUNTER — Encounter (HOSPITAL_COMMUNITY): Payer: Self-pay | Admitting: Emergency Medicine

## 2016-05-17 DIAGNOSIS — L03116 Cellulitis of left lower limb: Secondary | ICD-10-CM | POA: Diagnosis not present

## 2016-05-17 MED ORDER — CEFTRIAXONE SODIUM 1 G IJ SOLR
1.0000 g | Freq: Once | INTRAMUSCULAR | Status: AC
  Administered 2016-05-17: 1 g via INTRAMUSCULAR

## 2016-05-17 MED ORDER — LIDOCAINE HCL (PF) 1 % IJ SOLN
INTRAMUSCULAR | Status: AC
  Filled 2016-05-17: qty 2

## 2016-05-17 MED ORDER — CEFTRIAXONE SODIUM 1 G IJ SOLR
INTRAMUSCULAR | Status: AC
  Filled 2016-05-17: qty 10

## 2016-05-17 MED ORDER — CEPHALEXIN 500 MG PO CAPS: 500.0000 mg | ORAL_CAPSULE | Freq: Four times a day (QID) | ORAL | 0 refills | Status: DC

## 2016-05-17 NOTE — Discharge Instructions (Signed)
You have a cellulitis again. We gave you a shot of Rocephin here. Start the Keflex tomorrow. The redness may get a little bit worse tomorrow, but should be improving within 48 hours. Follow-up as needed.

## 2016-05-17 NOTE — ED Provider Notes (Signed)
[facility]    CSN: [id number] Arrival date & time: 05/17/16  1919     History   Chief Complaint Chief Complaint  Patient presents with  . Fever    HPI Jeffrey KannerDavid Anthony [name] is a 55 y.o. male.   HPI He is a 55 year old man here for evaluation of fever and left foot redness. He has a history of strep cellulitis. His symptoms started yesterday with some redness over the forefoot and some fevers and chills. Today, he has had persistent fevers and the redness is spreading up his foot. It is quite tender and warm to touch.  Past Medical History:  Diagnosis Date  . Depression   . Diabetes mellitus without complication (HCC)   . GERD (gastroesophageal reflux disease)   . Herniated disc   . Hypertension   . Sleep apnea uses c-pap  . Wears glasses     Patient Active Problem List   Diagnosis Date Noted  . Umbilical hernia, incarcerated 10/19/2014  . Disc displacement, lumbar 07/23/2011  . Stenosis, spinal, lumbar 07/23/2011    Past Surgical History:  Procedure Laterality Date  . ANKLE SURGERY     left fx  . APPENDECTOMY    . CARPAL TUNNEL RELEASE Right 10/05/2014   Procedure: RIGHT CARPAL TUNNEL RELEASE;  Surgeon: Dominica SeverinWilliam Gramig, MD;  Location: [facility];  Service: Orthopedics;  Laterality: Right;  . COLONOSCOPY    . LUMBAR LAMINECTOMY/DECOMPRESSION MICRODISCECTOMY  07/23/2011   Procedure: LUMBAR LAMINECTOMY/DECOMPRESSION MICRODISCECTOMY;  Surgeon: Jacki Conesonald A Gioffre, MD;  Location: [facility];  Service: Orthopedics;  Laterality: Right;  . ULNAR TUNNEL RELEASE Right 10/05/2014   Procedure: RIGHT CUBITAL TUNNEL RELEASE;  Surgeon: Dominica SeverinWilliam Gramig, MD;  Location: [facility];  Service: Orthopedics;  Laterality: Right;  . UMBILICAL HERNIA REPAIR N/A 10/20/2014   Procedure: HERNIA REPAIR UMBILICAL ADULT;  Surgeon: Renda RollsWilton Smith, MD;  Location: [facility];  Service: General;  Laterality: N/A;  . VASECTOMY         Home Medications    Prior to  Admission medications   Medication Sig Start Date End Date Taking? Authorizing Provider  canagliflozin (INVOKANA) 300 MG TABS tablet Take 1 tablet by mouth daily. 08/30/15  Yes Historical Provider, MD  lisinopril (PRINIVIL,ZESTRIL) 20 MG tablet Take 20 mg by mouth daily.   Yes Historical Provider, MD  omeprazole (PRILOSEC) 20 MG capsule TAKE 1 CAPSULE BY MOUTH ONCE A DAY 10/03/15  Yes Faythe GheeSusan W Fisher, PA-C  aspirin 81 MG tablet Take 81 mg by mouth daily. Reported on 12/09/2015    Historical Provider, MD  cephALEXin (KEFLEX) 500 MG capsule Take 1 capsule (500 mg total) by mouth 4 (four) times daily. 05/17/16   Charm RingsErin J Laken Rog, MD  CIALIS 5 MG tablet TAKE 1 TABLET BY MOUTH ONCE DAILY 04/16/16   Faythe GheeSusan W Fisher, PA-C    Family History Family History  Problem Relation Age of Onset  . Diabetes Mother   . Hypertension Father     Social History Social History  Substance Use Topics  . Smoking status: Current Every Day Smoker    Packs/day: 0.25    Years: 35.00    Types: Cigarettes  . Smokeless tobacco: Never Used  . Alcohol use 1.2 - 1.8 oz/week    2 Cans of beer per week     Comment: rare     Allergies   Penicillin g; Penicillins; Bupropion; and Escitalopram oxalate   Review of Systems Review of Systems As in history of present illness  Physical Exam Triage Vital Signs ED Triage Vitals  Enc Vitals Group     BP 05/17/16 1941 119/75     Pulse Rate 05/17/16 1941 93     Resp 05/17/16 1941 18     Temp 05/17/16 1941 100.2 F (37.9 C)     Temp Source 05/17/16 1941 Oral     SpO2 05/17/16 1941 99 %     Weight --      Height --      Head Circumference --      Peak Flow --      Pain Score 05/17/16 1943 5     Pain Loc --      Pain Edu? --      Excl. in GC? --    No data found.   Updated Vital Signs BP 119/75 (BP Location: Left Arm)   Pulse 93   Temp 100.2 F (37.9 C) (Oral)   Resp 18   SpO2 99%   Visual Acuity Right Eye Distance:   Left Eye Distance:   Bilateral Distance:      Right Eye Near:   Left Eye Near:    Bilateral Near:     Physical Exam  Constitutional: He is oriented to person, place, and time. He appears well-developed and well-nourished. No distress.  Cardiovascular: Normal rate.   Pulmonary/Chest: Effort normal.  Neurological: He is alert and oriented to person, place, and time.  Skin:  There is erythema and swelling to the dorsal left foot. This is warm to touch. Parts have well demarcated borders.     UC Treatments / Results  Labs (all labs ordered are listed, but only abnormal results are displayed) Labs Reviewed - No data to display  EKG  EKG Interpretation None       Radiology No results found.  Procedures Procedures (including critical care time)  Medications Ordered in UC Medications  cefTRIAXone (ROCEPHIN) injection 1 g (not administered)     Initial Impression / Assessment and Plan / UC Course  I have reviewed the triage vital signs and the nursing notes.  Pertinent labs & imaging results that were available during my care of the patient were reviewed by me and considered in my medical decision making (see chart for details).  Clinical Course     Treatment for cellulitis with Rocephin IM here. Keflex for one week. Patient states he does okay with cephalosporins. Return precautions reviewed.  Final Clinical Impressions(s) / UC Diagnoses   Final diagnoses:  Cellulitis of left lower extremity    New Prescriptions New Prescriptions   CEPHALEXIN (KEFLEX) 500 MG CAPSULE    Take 1 capsule (500 mg total) by mouth 4 (four) times daily.     Charm RingsErin J Yamil Dougher, MD 05/17/16 2028

## 2016-05-17 NOTE — ED Triage Notes (Signed)
C/o fevers onset 2 days associated w/chills also reports   Also reports hx of strep cellulitis on left foot... Has a red streak on left foot that began this morning  Denies inj/trauma  Steady gait... A&O x4... NAD

## 2016-05-17 NOTE — ED Notes (Signed)
Patient has irregularly shaped red areas to left foot/ankle.  Denies numbness, denies tingling.  Patient reports this is a reoccurring issue.  Able to move toes.  Pedal pulse 2 plus

## 2016-05-26 DIAGNOSIS — Z79899 Other long term (current) drug therapy: Secondary | ICD-10-CM | POA: Diagnosis not present

## 2016-05-26 DIAGNOSIS — E119 Type 2 diabetes mellitus without complications: Secondary | ICD-10-CM | POA: Diagnosis not present

## 2016-05-26 DIAGNOSIS — I10 Essential (primary) hypertension: Secondary | ICD-10-CM | POA: Diagnosis not present

## 2016-06-10 ENCOUNTER — Other Ambulatory Visit: Payer: Self-pay | Admitting: Physician Assistant

## 2016-06-10 MED ORDER — AZITHROMYCIN 250 MG PO TABS
ORAL_TABLET | ORAL | 0 refills | Status: DC
Start: 2016-06-10 — End: 2016-06-25

## 2016-06-10 NOTE — Progress Notes (Unsigned)
Pt called, has sinus infection with green mucus, no fever/chills, some sinus pain, no cp/sob Sent rx for zpack to [facility] for sinusitis

## 2016-06-25 ENCOUNTER — Other Ambulatory Visit: Payer: Self-pay | Admitting: Physician Assistant

## 2016-06-25 MED ORDER — LEVOFLOXACIN 500 MG PO TABS: 500.0000 mg | ORAL_TABLET | Freq: Every day | ORAL | 0 refills | Status: DC

## 2016-06-25 NOTE — Progress Notes (Signed)
Pt called and is still sick from coughing, fever couple of days ago but none now, no cp/sob, wife had same thing and responded well to levaquin Called in levaquin to pharmacy, pt to come by clinic for us to listen to his lungs, etc

## 2016-07-03 ENCOUNTER — Telehealth: Payer: Self-pay | Admitting: Emergency Medicine

## 2016-07-03 NOTE — Telephone Encounter (Signed)
Patient called and expressed that the Cialis he has been taking is longer covered on his insurance.  He is now requesting to take Viagra.  I informed Durward Parcelon Smith, PA and he has changed the script to Viagra 50mg  #10 w/ 6 month refill.  Patient has been notified.

## 2016-07-08 ENCOUNTER — Other Ambulatory Visit: Payer: Self-pay

## 2016-07-08 NOTE — Patient Outreach (Signed)
[facility]) Care Management  07/08/2016  Lynnda ChildDavid Anthony [facility] 12/13/1960 [id number]   E-mail out reach to Alinda Moneyony regarding [employer] - I have not seen him onboard.   Susette RacerJulie Sharalyn Lomba, RN, BA, MHA, CDE [facility] Diabetes Coordinator- Link To [employer] Direct Dial:  [phone]  Fax:  [phone] E-mail: [email or web address] [address], [place], [place]  [id number]

## 2016-07-14 DIAGNOSIS — G4733 Obstructive sleep apnea (adult) (pediatric): Secondary | ICD-10-CM | POA: Diagnosis not present

## 2016-09-03 DIAGNOSIS — H65112 Acute and subacute allergic otitis media (mucoid) (sanguinous) (serous), left ear: Secondary | ICD-10-CM | POA: Diagnosis not present

## 2016-10-23 IMAGING — CT CT ABD-PELV W/ CM
1 of 3 series · 14 of 32 positions shown, 19 images · IV contrast (omnipaque)
Comparison: 12/01/2018

CLINICAL DATA: Incarcerated umbilical hernia. Left lower quadrant
pain beginning yesterday. Fever.

EXAM:
CT ABDOMEN AND PELVIS WITH CONTRAST
TECHNIQUE: Multidetector CT imaging of the abdomen and pelvis was performed
using the standard protocol following bolus administration of
intravenous contrast.
CONTRAST:  150mL OMNIPAQUE IOHEXOL 350 MG/ML SOLN

[Series 2: routine abd pel with · axial · 0.93mm/px · z∈[-1165,-720]mm · 14 of 101 slices shown, 19 images]
[im 6/101  soft-tissue]
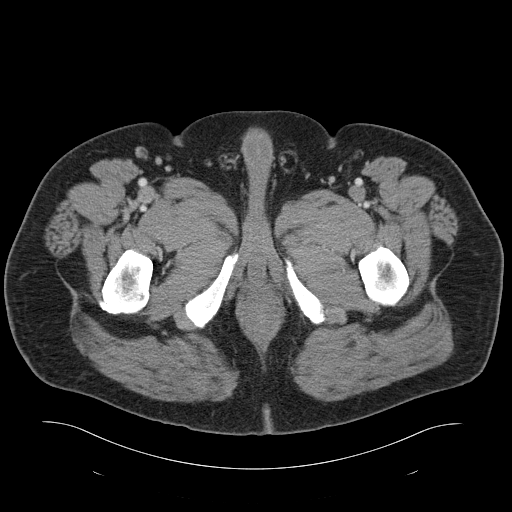
[im 6/101  bone]
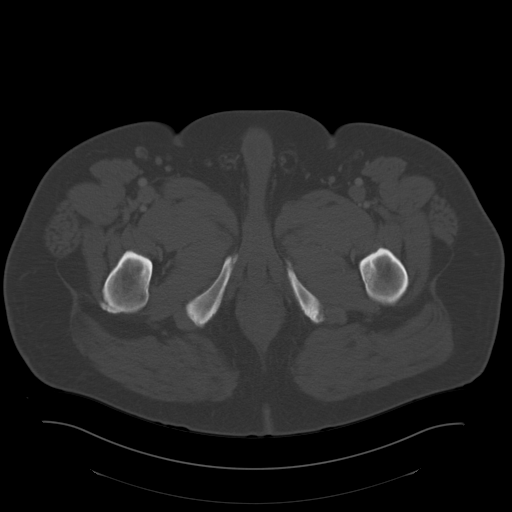
[im 12/101  soft-tissue]
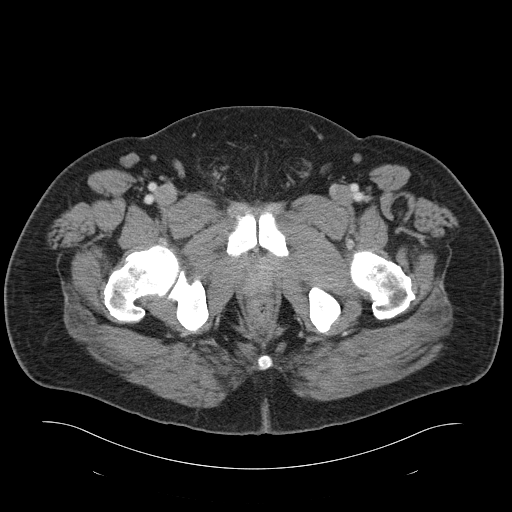
[im 23/101  soft-tissue]
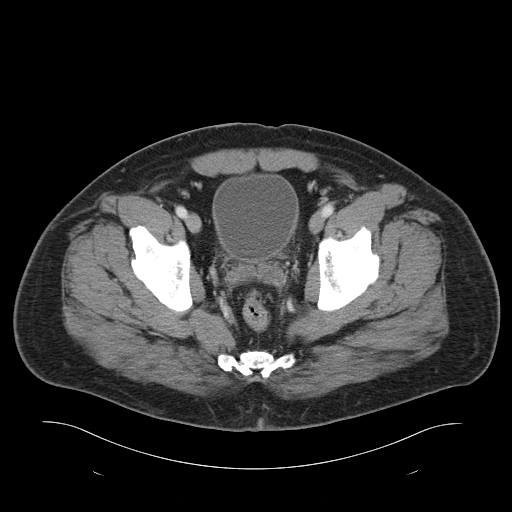
[im 28/101  soft-tissue]
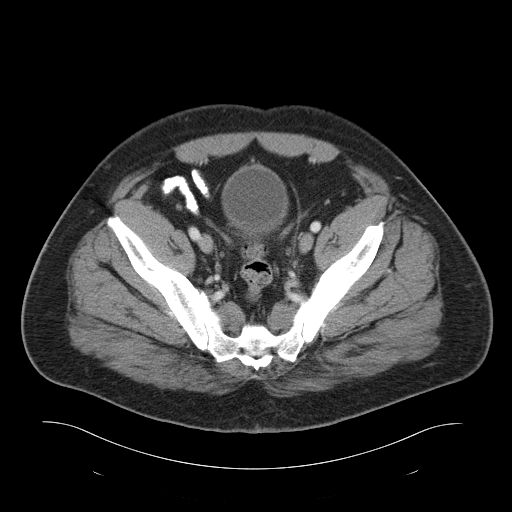
[im 34/101  soft-tissue]
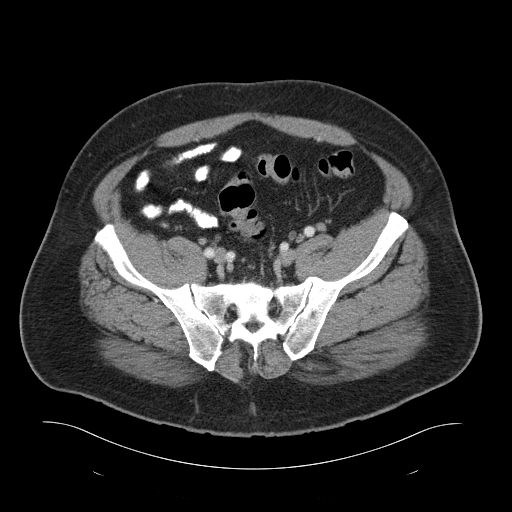
[im 45/101  soft-tissue]
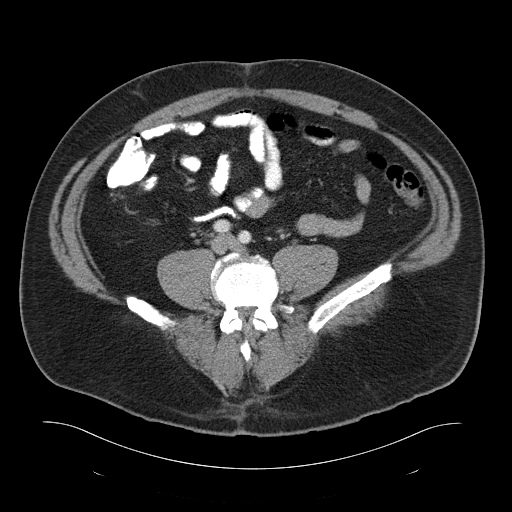
[im 51/101  soft-tissue]
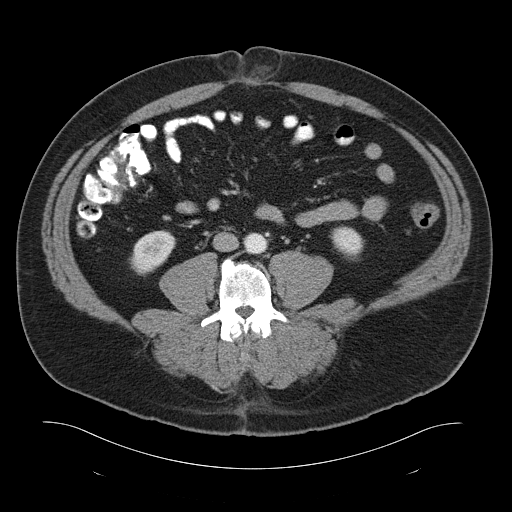
[im 56/101  soft-tissue]
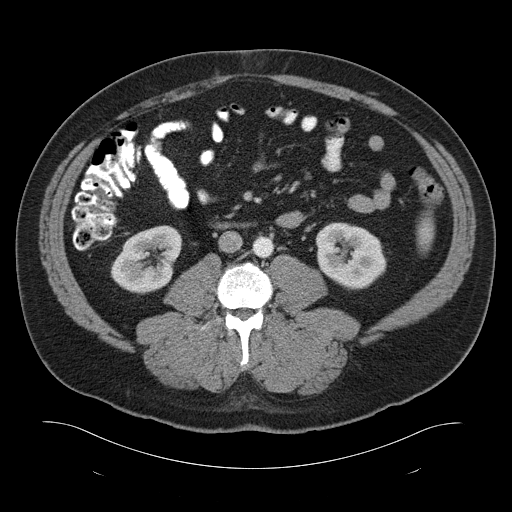
[im 67/101  soft-tissue]
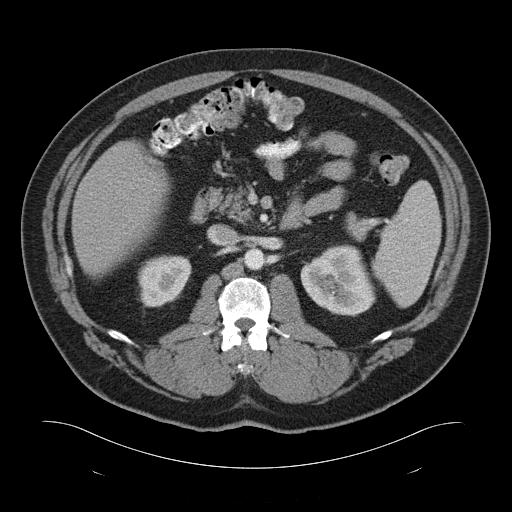
[im 67/101  bone]
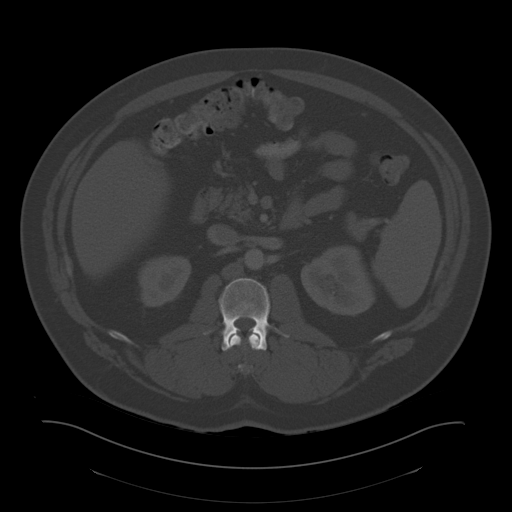
[im 73/101  soft-tissue]
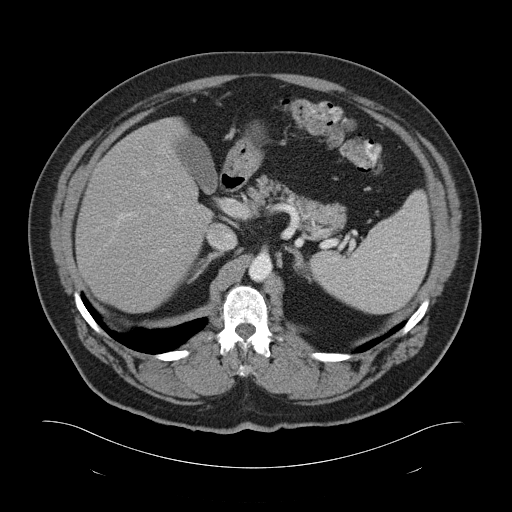
[im 78/101  soft-tissue]
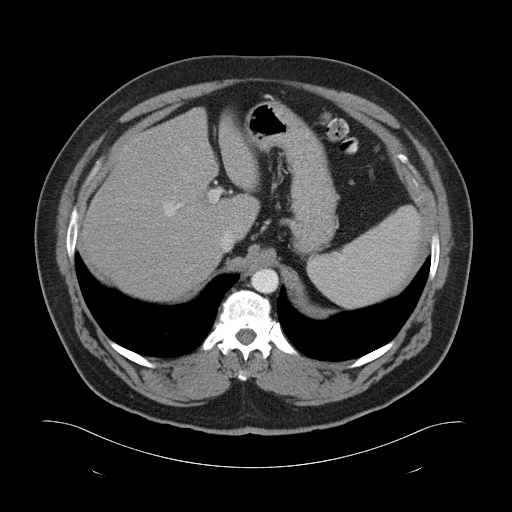
[im 78/101  lung]
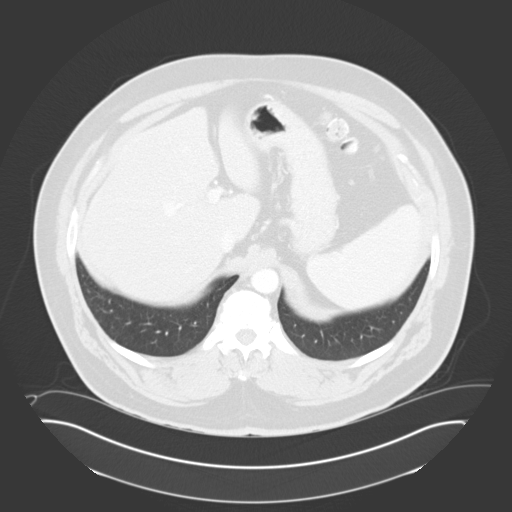
[im 84/101  lung]
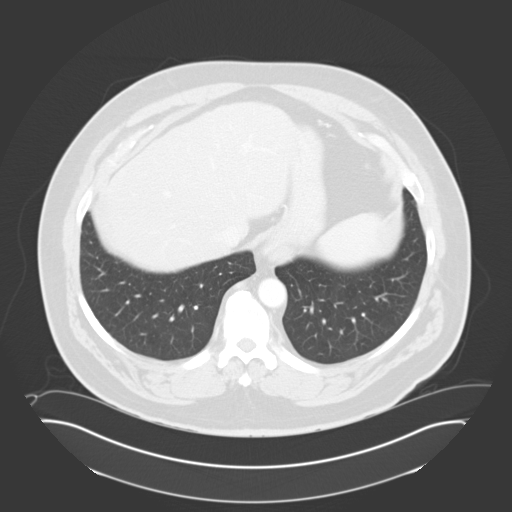
[im 89/101  soft-tissue]
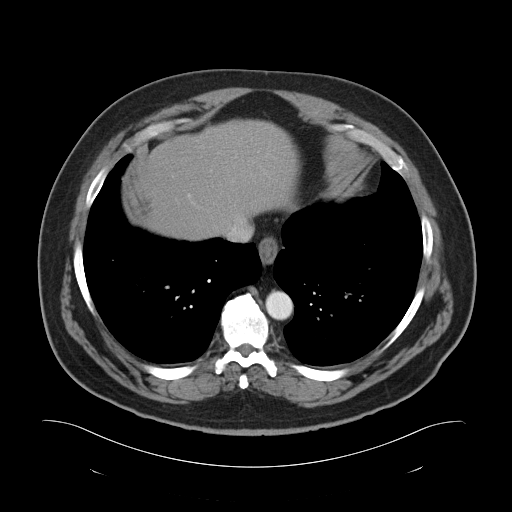
[im 89/101  lung]
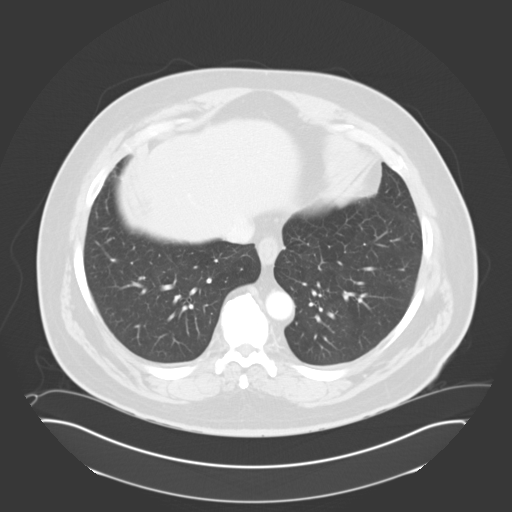
[im 95/101  soft-tissue]
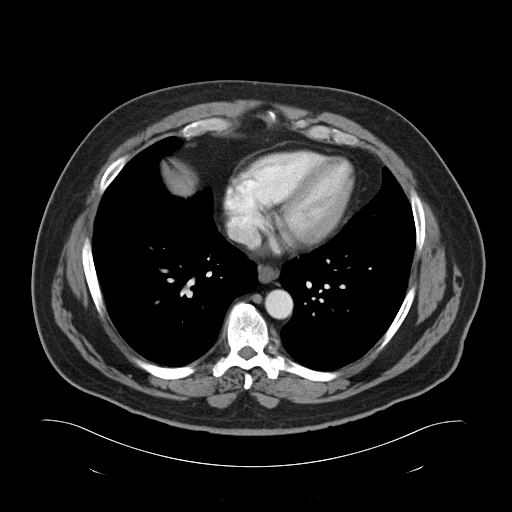
[im 95/101  lung]
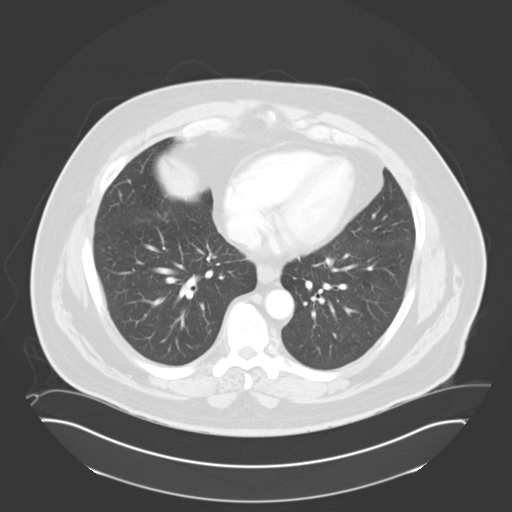

[14 of 32 positions shown; findings below may reference images not displayed]

FINDINGS: There is a 3 mm subpleural nodule in the left lower lobe, similar to
the prior CT and considered benign.

Decreased attenuation of the liver is compatible with mild
steatosis. No focal liver lesion is identified. The gallbladder,
spleen, adrenal glands, and pancreas have an unremarkable enhanced
appearance. 2.5 cm low-density lesion in the interpolar right kidney
is unchanged in size and most compatible with a cyst, with a
punctate calcification noted at its posterior margin, unchanged.
Subcentimeter low-density lesions in both kidneys are too small to
fully characterize but also most likely represent cysts.

Oral contrast is present in nondilated loops of small and large
bowel to the level of the transverse colon. There is no evidence of
obstruction. No bowel wall thickening is seen. Appendix is absent.

Bladder is unremarkable. Fat containing umbilical hernia is larger
than on the prior study, measuring approximately 6.3 x 3.5 cm with
an approximately 2 cm wide aperture. There is mild-to-moderate
inflammatory stranding involving the herniated fat. There is no
bowel within the hernia. No free fluid or enlarged lymph nodes are
identified. Thoracolumbar spondylosis is noted.
IMPRESSION: 1. Increased size of fat-containing umbilical hernia with
mild-to-moderate fat stranding. No bowel within the hernia.
2. Hepatic steatosis.

These results were called by telephone at the time of interpretation
on 10/19/2014 at [DATE] to Dr. GOLMOHAMAD KAIL , who verbally
acknowledged these results.

## 2016-10-30 ENCOUNTER — Other Ambulatory Visit: Payer: Self-pay | Admitting: Physician Assistant

## 2016-11-02 NOTE — Telephone Encounter (Signed)
Med refill for omeprazole approved 

## 2017-01-12 DIAGNOSIS — G4733 Obstructive sleep apnea (adult) (pediatric): Secondary | ICD-10-CM | POA: Diagnosis not present

## 2017-03-15 DIAGNOSIS — Z79899 Other long term (current) drug therapy: Secondary | ICD-10-CM | POA: Diagnosis not present

## 2017-03-15 DIAGNOSIS — E119 Type 2 diabetes mellitus without complications: Secondary | ICD-10-CM | POA: Diagnosis not present

## 2017-03-15 DIAGNOSIS — E781 Pure hyperglyceridemia: Secondary | ICD-10-CM | POA: Diagnosis not present

## 2017-03-15 DIAGNOSIS — R5382 Chronic fatigue, unspecified: Secondary | ICD-10-CM | POA: Diagnosis not present

## 2017-03-16 DIAGNOSIS — E119 Type 2 diabetes mellitus without complications: Secondary | ICD-10-CM | POA: Diagnosis not present

## 2017-03-16 DIAGNOSIS — Z79899 Other long term (current) drug therapy: Secondary | ICD-10-CM | POA: Diagnosis not present

## 2017-03-16 DIAGNOSIS — I10 Essential (primary) hypertension: Secondary | ICD-10-CM | POA: Diagnosis not present

## 2017-03-16 DIAGNOSIS — Z0000 Encounter for general adult medical examination without abnormal findings: Secondary | ICD-10-CM | POA: Diagnosis not present

## 2017-05-11 DIAGNOSIS — G4733 Obstructive sleep apnea (adult) (pediatric): Secondary | ICD-10-CM | POA: Diagnosis not present

## 2017-05-12 NOTE — Patient Outreach (Signed)
[facility] Care Management  05/12/2017  Lynnda ChildDavid Anthony [facility] 06/24/1960 [id number]   I have removed myself from the care team for Link to Wellness.  I will continue to assist Alinda Moneyony with his diabetes with the [facility] app through [employer].   Susette RacerJulie Aleaya Latona, RN, BA, MHA, CDE [facility] Diabetes Coordinator- Link To [employer] Direct Dial:  [phone]  Fax:  [phone] E-mail: [email or web address] [address], [place], [place]  [phone]

## 2017-06-03 DIAGNOSIS — H524 Presbyopia: Secondary | ICD-10-CM | POA: Diagnosis not present

## 2017-06-03 DIAGNOSIS — E119 Type 2 diabetes mellitus without complications: Secondary | ICD-10-CM | POA: Diagnosis not present

## 2017-06-10 DIAGNOSIS — G4733 Obstructive sleep apnea (adult) (pediatric): Secondary | ICD-10-CM | POA: Diagnosis not present

## 2017-07-11 DIAGNOSIS — G4733 Obstructive sleep apnea (adult) (pediatric): Secondary | ICD-10-CM | POA: Diagnosis not present

## 2017-08-11 DIAGNOSIS — G4733 Obstructive sleep apnea (adult) (pediatric): Secondary | ICD-10-CM | POA: Diagnosis not present

## 2017-11-03 DIAGNOSIS — G4733 Obstructive sleep apnea (adult) (pediatric): Secondary | ICD-10-CM | POA: Diagnosis not present

## 2017-11-16 DIAGNOSIS — J0100 Acute maxillary sinusitis, unspecified: Secondary | ICD-10-CM | POA: Diagnosis not present

## 2017-11-16 DIAGNOSIS — W57XXXA Bitten or stung by nonvenomous insect and other nonvenomous arthropods, initial encounter: Secondary | ICD-10-CM | POA: Diagnosis not present

## 2018-02-23 DIAGNOSIS — G4733 Obstructive sleep apnea (adult) (pediatric): Secondary | ICD-10-CM | POA: Diagnosis not present

## 2018-03-12 DIAGNOSIS — H60502 Unspecified acute noninfective otitis externa, left ear: Secondary | ICD-10-CM | POA: Diagnosis not present

## 2018-03-24 DIAGNOSIS — H524 Presbyopia: Secondary | ICD-10-CM | POA: Diagnosis not present

## 2018-03-24 DIAGNOSIS — E119 Type 2 diabetes mellitus without complications: Secondary | ICD-10-CM | POA: Diagnosis not present

## 2018-03-25 DIAGNOSIS — R7989 Other specified abnormal findings of blood chemistry: Secondary | ICD-10-CM | POA: Diagnosis not present

## 2018-03-25 DIAGNOSIS — Z79899 Other long term (current) drug therapy: Secondary | ICD-10-CM | POA: Diagnosis not present

## 2018-03-25 DIAGNOSIS — Z0000 Encounter for general adult medical examination without abnormal findings: Secondary | ICD-10-CM | POA: Diagnosis not present

## 2018-03-25 DIAGNOSIS — Z125 Encounter for screening for malignant neoplasm of prostate: Secondary | ICD-10-CM | POA: Diagnosis not present

## 2018-03-25 DIAGNOSIS — I10 Essential (primary) hypertension: Secondary | ICD-10-CM | POA: Diagnosis not present

## 2018-03-25 DIAGNOSIS — E119 Type 2 diabetes mellitus without complications: Secondary | ICD-10-CM | POA: Diagnosis not present

## 2018-09-14 MED FILL — INVOKANA 300 MG TABLET: 300 | 90 days supply | Qty: 90 | Fill #0

## 2018-09-14 MED FILL — OMEPRAZOLE 20 MG CPDR: 20 | 90 days supply | Qty: 90 | Fill #0

## 2018-09-17 MED FILL — LISINOPRIL 20 MG TABLET: 20 | 90 days supply | Qty: 90 | Fill #0

## 2018-09-26 DIAGNOSIS — I10 Essential (primary) hypertension: Secondary | ICD-10-CM | POA: Diagnosis not present

## 2018-09-26 DIAGNOSIS — E1159 Type 2 diabetes mellitus with other circulatory complications: Secondary | ICD-10-CM | POA: Diagnosis not present

## 2018-12-22 DIAGNOSIS — G4733 Obstructive sleep apnea (adult) (pediatric): Secondary | ICD-10-CM | POA: Diagnosis not present

## 2019-03-20 DIAGNOSIS — E7800 Pure hypercholesterolemia, unspecified: Secondary | ICD-10-CM | POA: Diagnosis not present

## 2019-03-20 DIAGNOSIS — I10 Essential (primary) hypertension: Secondary | ICD-10-CM | POA: Diagnosis not present

## 2019-03-20 DIAGNOSIS — Z79899 Other long term (current) drug therapy: Secondary | ICD-10-CM | POA: Diagnosis not present

## 2019-03-20 DIAGNOSIS — E1159 Type 2 diabetes mellitus with other circulatory complications: Secondary | ICD-10-CM | POA: Diagnosis not present

## 2019-03-20 DIAGNOSIS — R7989 Other specified abnormal findings of blood chemistry: Secondary | ICD-10-CM | POA: Diagnosis not present

## 2019-03-20 DIAGNOSIS — Z125 Encounter for screening for malignant neoplasm of prostate: Secondary | ICD-10-CM | POA: Diagnosis not present

## 2019-03-22 DIAGNOSIS — G4733 Obstructive sleep apnea (adult) (pediatric): Secondary | ICD-10-CM | POA: Diagnosis not present

## 2019-03-27 DIAGNOSIS — Z0000 Encounter for general adult medical examination without abnormal findings: Secondary | ICD-10-CM | POA: Diagnosis not present

## 2019-03-27 DIAGNOSIS — E119 Type 2 diabetes mellitus without complications: Secondary | ICD-10-CM | POA: Diagnosis not present

## 2019-03-27 DIAGNOSIS — E7800 Pure hypercholesterolemia, unspecified: Secondary | ICD-10-CM | POA: Diagnosis not present

## 2019-03-27 DIAGNOSIS — I10 Essential (primary) hypertension: Secondary | ICD-10-CM | POA: Diagnosis not present

## 2019-05-23 DIAGNOSIS — H524 Presbyopia: Secondary | ICD-10-CM | POA: Diagnosis not present

## 2019-05-23 DIAGNOSIS — E119 Type 2 diabetes mellitus without complications: Secondary | ICD-10-CM | POA: Diagnosis not present

## 2019-10-02 DIAGNOSIS — Z79899 Other long term (current) drug therapy: Secondary | ICD-10-CM | POA: Diagnosis not present

## 2019-10-02 DIAGNOSIS — E7800 Pure hypercholesterolemia, unspecified: Secondary | ICD-10-CM | POA: Diagnosis not present

## 2019-10-02 DIAGNOSIS — E119 Type 2 diabetes mellitus without complications: Secondary | ICD-10-CM | POA: Diagnosis not present

## 2019-10-05 DIAGNOSIS — E119 Type 2 diabetes mellitus without complications: Secondary | ICD-10-CM | POA: Diagnosis not present

## 2019-10-05 DIAGNOSIS — E7800 Pure hypercholesterolemia, unspecified: Secondary | ICD-10-CM | POA: Diagnosis not present

## 2019-10-05 DIAGNOSIS — G4733 Obstructive sleep apnea (adult) (pediatric): Secondary | ICD-10-CM | POA: Diagnosis not present

## 2019-10-05 DIAGNOSIS — I10 Essential (primary) hypertension: Secondary | ICD-10-CM | POA: Diagnosis not present

## 2019-11-14 DIAGNOSIS — M9901 Segmental and somatic dysfunction of cervical region: Secondary | ICD-10-CM | POA: Diagnosis not present

## 2019-11-14 DIAGNOSIS — M9902 Segmental and somatic dysfunction of thoracic region: Secondary | ICD-10-CM | POA: Diagnosis not present

## 2019-11-14 DIAGNOSIS — M9904 Segmental and somatic dysfunction of sacral region: Secondary | ICD-10-CM | POA: Diagnosis not present

## 2019-11-14 DIAGNOSIS — M9903 Segmental and somatic dysfunction of lumbar region: Secondary | ICD-10-CM | POA: Diagnosis not present

## 2019-11-14 DIAGNOSIS — M542 Cervicalgia: Secondary | ICD-10-CM | POA: Diagnosis not present

## 2019-11-14 DIAGNOSIS — M5387 Other specified dorsopathies, lumbosacral region: Secondary | ICD-10-CM | POA: Diagnosis not present

## 2019-11-21 DIAGNOSIS — M9901 Segmental and somatic dysfunction of cervical region: Secondary | ICD-10-CM | POA: Diagnosis not present

## 2019-11-21 DIAGNOSIS — M5387 Other specified dorsopathies, lumbosacral region: Secondary | ICD-10-CM | POA: Diagnosis not present

## 2019-11-21 DIAGNOSIS — M9903 Segmental and somatic dysfunction of lumbar region: Secondary | ICD-10-CM | POA: Diagnosis not present

## 2019-11-21 DIAGNOSIS — M542 Cervicalgia: Secondary | ICD-10-CM | POA: Diagnosis not present

## 2019-11-21 DIAGNOSIS — M9902 Segmental and somatic dysfunction of thoracic region: Secondary | ICD-10-CM | POA: Diagnosis not present

## 2019-11-21 DIAGNOSIS — M9904 Segmental and somatic dysfunction of sacral region: Secondary | ICD-10-CM | POA: Diagnosis not present

## 2020-01-29 DIAGNOSIS — M4603 Spinal enthesopathy, cervicothoracic region: Secondary | ICD-10-CM | POA: Diagnosis not present

## 2020-01-29 DIAGNOSIS — M9901 Segmental and somatic dysfunction of cervical region: Secondary | ICD-10-CM | POA: Diagnosis not present

## 2020-01-29 DIAGNOSIS — M9904 Segmental and somatic dysfunction of sacral region: Secondary | ICD-10-CM | POA: Diagnosis not present

## 2020-01-29 DIAGNOSIS — M9902 Segmental and somatic dysfunction of thoracic region: Secondary | ICD-10-CM | POA: Diagnosis not present

## 2020-01-29 DIAGNOSIS — M5387 Other specified dorsopathies, lumbosacral region: Secondary | ICD-10-CM | POA: Diagnosis not present

## 2020-01-29 DIAGNOSIS — M9903 Segmental and somatic dysfunction of lumbar region: Secondary | ICD-10-CM | POA: Diagnosis not present

## 2020-02-06 DIAGNOSIS — G4733 Obstructive sleep apnea (adult) (pediatric): Secondary | ICD-10-CM | POA: Diagnosis not present

## 2020-03-22 ENCOUNTER — Other Ambulatory Visit: Payer: Self-pay | Admitting: Family Medicine

## 2020-04-01 DIAGNOSIS — Z125 Encounter for screening for malignant neoplasm of prostate: Secondary | ICD-10-CM | POA: Diagnosis not present

## 2020-04-01 DIAGNOSIS — Z79899 Other long term (current) drug therapy: Secondary | ICD-10-CM | POA: Diagnosis not present

## 2020-04-01 DIAGNOSIS — E119 Type 2 diabetes mellitus without complications: Secondary | ICD-10-CM | POA: Diagnosis not present

## 2020-04-01 DIAGNOSIS — E7800 Pure hypercholesterolemia, unspecified: Secondary | ICD-10-CM | POA: Diagnosis not present

## 2020-04-05 DIAGNOSIS — I10 Essential (primary) hypertension: Secondary | ICD-10-CM | POA: Diagnosis not present

## 2020-04-05 DIAGNOSIS — E1169 Type 2 diabetes mellitus with other specified complication: Secondary | ICD-10-CM | POA: Diagnosis not present

## 2020-04-05 DIAGNOSIS — Z79899 Other long term (current) drug therapy: Secondary | ICD-10-CM | POA: Diagnosis not present

## 2020-04-05 DIAGNOSIS — Z0000 Encounter for general adult medical examination without abnormal findings: Secondary | ICD-10-CM | POA: Diagnosis not present

## 2020-05-22 DIAGNOSIS — G4733 Obstructive sleep apnea (adult) (pediatric): Secondary | ICD-10-CM | POA: Diagnosis not present

## 2020-05-24 DIAGNOSIS — H524 Presbyopia: Secondary | ICD-10-CM | POA: Diagnosis not present

## 2020-05-28 DIAGNOSIS — H524 Presbyopia: Secondary | ICD-10-CM | POA: Diagnosis not present

## 2020-06-19 ENCOUNTER — Other Ambulatory Visit: Payer: Self-pay | Admitting: Family Medicine

## 2020-08-21 DIAGNOSIS — G4733 Obstructive sleep apnea (adult) (pediatric): Secondary | ICD-10-CM | POA: Diagnosis not present

## 2020-09-16 ENCOUNTER — Other Ambulatory Visit: Payer: Self-pay

## 2020-09-16 MED ORDER — LISINOPRIL 20 MG PO TABS
ORAL_TABLET | ORAL | 1 refills | Status: DC
  Filled 2020-09-16: qty 90, 90d supply, fill #0
  Filled 2020-12-19: qty 90, 90d supply, fill #1

## 2020-09-16 MED ORDER — INVOKANA 300 MG PO TABS
ORAL_TABLET | ORAL | 1 refills | Status: DC
  Filled 2020-09-16: qty 90, 90d supply, fill #0
  Filled 2020-12-19: qty 90, 90d supply, fill #1

## 2020-09-16 MED ORDER — OMEPRAZOLE 20 MG PO CPDR
DELAYED_RELEASE_CAPSULE | ORAL | 1 refills | Status: DC
  Filled 2020-09-16: qty 90, 90d supply, fill #0
  Filled 2020-12-19: qty 90, 90d supply, fill #1

## 2020-09-16 MED ORDER — ATORVASTATIN CALCIUM 10 MG PO TABS
1.0000 | ORAL_TABLET | Freq: Every day | ORAL | 1 refills | Status: DC
  Filled 2020-09-16: qty 90, 90d supply, fill #0
  Filled 2020-12-19: qty 90, 90d supply, fill #1

## 2020-09-17 ENCOUNTER — Other Ambulatory Visit: Payer: Self-pay

## 2020-09-18 ENCOUNTER — Other Ambulatory Visit: Payer: Self-pay

## 2020-09-30 DIAGNOSIS — Z79899 Other long term (current) drug therapy: Secondary | ICD-10-CM | POA: Diagnosis not present

## 2020-09-30 DIAGNOSIS — E1169 Type 2 diabetes mellitus with other specified complication: Secondary | ICD-10-CM | POA: Diagnosis not present

## 2020-09-30 DIAGNOSIS — E785 Hyperlipidemia, unspecified: Secondary | ICD-10-CM | POA: Diagnosis not present

## 2020-10-08 DIAGNOSIS — K219 Gastro-esophageal reflux disease without esophagitis: Secondary | ICD-10-CM | POA: Diagnosis not present

## 2020-10-08 DIAGNOSIS — E1169 Type 2 diabetes mellitus with other specified complication: Secondary | ICD-10-CM | POA: Diagnosis not present

## 2020-10-08 DIAGNOSIS — G4733 Obstructive sleep apnea (adult) (pediatric): Secondary | ICD-10-CM | POA: Diagnosis not present

## 2020-10-08 DIAGNOSIS — I10 Essential (primary) hypertension: Secondary | ICD-10-CM | POA: Diagnosis not present

## 2020-10-10 ENCOUNTER — Other Ambulatory Visit: Payer: Self-pay

## 2020-10-10 DIAGNOSIS — E119 Type 2 diabetes mellitus without complications: Secondary | ICD-10-CM | POA: Diagnosis not present

## 2020-10-10 DIAGNOSIS — Z20822 Contact with and (suspected) exposure to covid-19: Secondary | ICD-10-CM | POA: Diagnosis not present

## 2020-10-10 DIAGNOSIS — R6889 Other general symptoms and signs: Secondary | ICD-10-CM | POA: Diagnosis not present

## 2020-10-10 DIAGNOSIS — I10 Essential (primary) hypertension: Secondary | ICD-10-CM | POA: Diagnosis not present

## 2020-10-10 MED ORDER — PAXLOVID 20 X 150 MG & 10 X 100MG PO TBPK
ORAL_TABLET | ORAL | 0 refills | Status: DC
  Filled 2020-10-10: qty 30, 5d supply, fill #0

## 2020-10-10 MED ORDER — PREDNISONE 10 MG PO TABS
ORAL_TABLET | ORAL | 0 refills | Status: DC
  Filled 2020-10-10: qty 5, 5d supply, fill #0

## 2020-11-12 DIAGNOSIS — M9901 Segmental and somatic dysfunction of cervical region: Secondary | ICD-10-CM | POA: Diagnosis not present

## 2020-11-12 DIAGNOSIS — M9904 Segmental and somatic dysfunction of sacral region: Secondary | ICD-10-CM | POA: Diagnosis not present

## 2020-11-12 DIAGNOSIS — M9903 Segmental and somatic dysfunction of lumbar region: Secondary | ICD-10-CM | POA: Diagnosis not present

## 2020-11-12 DIAGNOSIS — M5387 Other specified dorsopathies, lumbosacral region: Secondary | ICD-10-CM | POA: Diagnosis not present

## 2020-11-12 DIAGNOSIS — M4603 Spinal enthesopathy, cervicothoracic region: Secondary | ICD-10-CM | POA: Diagnosis not present

## 2020-11-12 DIAGNOSIS — M9902 Segmental and somatic dysfunction of thoracic region: Secondary | ICD-10-CM | POA: Diagnosis not present

## 2020-12-19 ENCOUNTER — Other Ambulatory Visit: Payer: Self-pay

## 2020-12-19 MED FILL — Sildenafil Citrate Tab 20 MG: ORAL | 20 days supply | Qty: 60 | Fill #0 | Status: AC

## 2020-12-20 ENCOUNTER — Other Ambulatory Visit (HOSPITAL_COMMUNITY): Payer: Self-pay

## 2020-12-20 ENCOUNTER — Other Ambulatory Visit: Payer: Self-pay

## 2021-03-04 DIAGNOSIS — M9901 Segmental and somatic dysfunction of cervical region: Secondary | ICD-10-CM | POA: Diagnosis not present

## 2021-03-04 DIAGNOSIS — M4603 Spinal enthesopathy, cervicothoracic region: Secondary | ICD-10-CM | POA: Diagnosis not present

## 2021-03-04 DIAGNOSIS — M9904 Segmental and somatic dysfunction of sacral region: Secondary | ICD-10-CM | POA: Diagnosis not present

## 2021-03-04 DIAGNOSIS — M9903 Segmental and somatic dysfunction of lumbar region: Secondary | ICD-10-CM | POA: Diagnosis not present

## 2021-03-04 DIAGNOSIS — M5387 Other specified dorsopathies, lumbosacral region: Secondary | ICD-10-CM | POA: Diagnosis not present

## 2021-03-04 DIAGNOSIS — M9902 Segmental and somatic dysfunction of thoracic region: Secondary | ICD-10-CM | POA: Diagnosis not present

## 2021-03-18 ENCOUNTER — Other Ambulatory Visit: Payer: Self-pay

## 2021-03-18 MED ORDER — LISINOPRIL 20 MG PO TABS
20.0000 mg | ORAL_TABLET | Freq: Every day | ORAL | 1 refills | Status: DC
  Filled 2021-03-18: qty 90, 90d supply, fill #0
  Filled 2021-06-11: qty 90, 90d supply, fill #1

## 2021-03-18 MED ORDER — INVOKANA 300 MG PO TABS
ORAL_TABLET | ORAL | 1 refills | Status: DC
  Filled 2021-03-18: qty 90, 90d supply, fill #0
  Filled 2021-06-11: qty 90, 90d supply, fill #1

## 2021-03-18 MED ORDER — OMEPRAZOLE 20 MG PO CPDR
20.0000 mg | DELAYED_RELEASE_CAPSULE | Freq: Every day | ORAL | 1 refills | Status: DC
  Filled 2021-03-18: qty 90, 90d supply, fill #0
  Filled 2021-06-11: qty 90, 90d supply, fill #1

## 2021-03-18 MED ORDER — ATORVASTATIN CALCIUM 10 MG PO TABS
10.0000 mg | ORAL_TABLET | Freq: Every day | ORAL | 1 refills | Status: DC
  Filled 2021-03-18: qty 90, 90d supply, fill #0
  Filled 2021-06-11: qty 90, 90d supply, fill #1

## 2021-03-21 DIAGNOSIS — G4733 Obstructive sleep apnea (adult) (pediatric): Secondary | ICD-10-CM | POA: Diagnosis not present

## 2021-04-21 DIAGNOSIS — Z125 Encounter for screening for malignant neoplasm of prostate: Secondary | ICD-10-CM | POA: Diagnosis not present

## 2021-04-21 DIAGNOSIS — Z79899 Other long term (current) drug therapy: Secondary | ICD-10-CM | POA: Diagnosis not present

## 2021-04-21 DIAGNOSIS — E1169 Type 2 diabetes mellitus with other specified complication: Secondary | ICD-10-CM | POA: Diagnosis not present

## 2021-04-21 DIAGNOSIS — E785 Hyperlipidemia, unspecified: Secondary | ICD-10-CM | POA: Diagnosis not present

## 2021-04-22 DIAGNOSIS — E1169 Type 2 diabetes mellitus with other specified complication: Secondary | ICD-10-CM | POA: Diagnosis not present

## 2021-04-22 DIAGNOSIS — Z79899 Other long term (current) drug therapy: Secondary | ICD-10-CM | POA: Diagnosis not present

## 2021-04-22 DIAGNOSIS — Z1331 Encounter for screening for depression: Secondary | ICD-10-CM | POA: Diagnosis not present

## 2021-04-22 DIAGNOSIS — Z0000 Encounter for general adult medical examination without abnormal findings: Secondary | ICD-10-CM | POA: Diagnosis not present

## 2021-04-22 DIAGNOSIS — I10 Essential (primary) hypertension: Secondary | ICD-10-CM | POA: Diagnosis not present

## 2021-04-28 ENCOUNTER — Encounter: Payer: Self-pay | Admitting: Emergency Medicine

## 2021-04-28 ENCOUNTER — Other Ambulatory Visit (HOSPITAL_COMMUNITY): Payer: Self-pay

## 2021-04-28 ENCOUNTER — Other Ambulatory Visit: Payer: Self-pay

## 2021-04-28 ENCOUNTER — Ambulatory Visit
Admission: EM | Admit: 2021-04-28 | Discharge: 2021-04-28 | Disposition: A | Discharge status: Home / Self Care | Payer: 59 | Attending: Emergency Medicine | Admitting: Emergency Medicine

## 2021-04-28 DIAGNOSIS — J069 Acute upper respiratory infection, unspecified: Secondary | ICD-10-CM | POA: Diagnosis not present

## 2021-04-28 MED ORDER — PREDNISONE 10 MG PO TABS
ORAL_TABLET | Freq: Every day | ORAL | 0 refills | Status: DC
  Filled 2021-04-28: qty 21, 6d supply, fill #0

## 2021-04-28 MED ORDER — BENZONATATE 100 MG PO CAPS
100.0000 mg | ORAL_CAPSULE | Freq: Three times a day (TID) | ORAL | 0 refills | Status: DC | PRN
  Filled 2021-04-28: qty 21, 7d supply, fill #0

## 2021-04-28 MED ORDER — ALBUTEROL SULFATE HFA 108 (90 BASE) MCG/ACT IN AERS
1.0000 | INHALATION_SPRAY | Freq: Four times a day (QID) | RESPIRATORY_TRACT | 0 refills | Status: DC | PRN
  Filled 2021-04-28: qty 18, 30d supply, fill #0

## 2021-04-28 NOTE — Discharge Instructions (Addendum)
Use the albuterol inhaler and take the prednisone as directed.    Take the Tessalon Perles as needed for cough.    Follow up with your primary care provider if your symptoms are not improving.    

## 2021-04-28 NOTE — ED Provider Notes (Signed)
Jeffrey [name]    CSN: [id number] Arrival date & time: 04/28/21  3976      History   Chief Complaint Chief Complaint  Patient presents with   Chills   Sinus Pressure/Pain   Cough    HPI Jeffrey [name] is a 60 y.o. male.  Patient presents with 3-day history of low-grade fever, chills, ear pain, sinus pressure, congestion, postnasal drip, cough.  He felt warm but has not taken his temperature.  He denies rash, shortness of breath, vomiting, diarrhea, or other symptoms.  Treatment attempted at home with OTC antihistamines.  His medical history includes hypertension and diabetes.  The history is provided by the patient and medical records.   Past Medical History:  Diagnosis Date   Depression    Diabetes mellitus without complication (HCC)    GERD (gastroesophageal reflux disease)    Herniated disc    Hypertension    Sleep apnea uses c-pap   Wears glasses     Patient Active Problem List   Diagnosis Date Noted   Umbilical hernia, incarcerated 10/19/2014   Disc displacement, lumbar 07/23/2011   Stenosis, spinal, lumbar 07/23/2011    Past Surgical History:  Procedure Laterality Date   ANKLE SURGERY     left fx   APPENDECTOMY     CARPAL TUNNEL RELEASE Right 10/05/2014   Procedure: RIGHT CARPAL TUNNEL RELEASE;  Surgeon: Dominica Severin, MD;  Location: [facility];  Service: Orthopedics;  Laterality: Right;   COLONOSCOPY     LUMBAR LAMINECTOMY/DECOMPRESSION MICRODISCECTOMY  07/23/2011   Procedure: LUMBAR LAMINECTOMY/DECOMPRESSION MICRODISCECTOMY;  Surgeon: Jacki Cones, MD;  Location: [facility];  Service: Orthopedics;  Laterality: Right;   ULNAR TUNNEL RELEASE Right 10/05/2014   Procedure: RIGHT CUBITAL TUNNEL RELEASE;  Surgeon: Dominica Severin, MD;  Location: [facility];  Service: Orthopedics;  Laterality: Right;   UMBILICAL HERNIA REPAIR N/A 10/20/2014   Procedure: HERNIA REPAIR UMBILICAL ADULT;  Surgeon: Renda Rolls, MD;  Location:  [facility];  Service: General;  Laterality: N/A;   VASECTOMY         Home Medications    Prior to Admission medications   Medication Sig Start Date End Date Taking? Authorizing Provider  albuterol (VENTOLIN HFA) 108 (90 Base) MCG/ACT inhaler Inhale 1-2 puffs into the lungs every 6 (six) hours as needed for wheezing or shortness of breath. 04/28/21  Yes Mickie Bail, NP  aspirin 81 MG tablet Take 81 mg by mouth daily. Reported on 12/09/2015   Yes [provider]  atorvastatin (LIPITOR) 10 MG tablet Take 1 tablet (10 mg total) by mouth once daily 09/16/20  Yes   benzonatate (TESSALON) 100 MG capsule Take 1 capsule (100 mg total) by mouth 3 (three) times daily as needed for cough. 04/28/21  Yes Mickie Bail, NP  canagliflozin (INVOKANA) 300 MG TABS tablet Take 1 tablet by mouth daily. 08/30/15  Yes [provider]  CIALIS 5 MG tablet TAKE 1 TABLET BY MOUTH ONCE DAILY 04/16/16  Yes Fisher, Roselyn Bering, PA-C  lisinopril (PRINIVIL,ZESTRIL) 20 MG tablet Take 20 mg by mouth daily.   Yes [provider]  omeprazole (PRILOSEC) 20 MG capsule TAKE 1 CAPSULE BY MOUTH ONCE A DAY 11/02/16  Yes Fisher, Roselyn Bering, PA-C  predniSONE (STERAPRED UNI-PAK 21 TAB) 10 MG (21) TBPK tablet Take by mouth daily. As directed 04/28/21  Yes Mickie Bail, NP  sildenafil (REVATIO) 20 MG tablet TAKE 3 TABLETS BY MOUTH ONCE DAILY AS NEEDED 06/19/20 06/19/21  Yes Kandyce Rud, MD  atorvastatin (LIPITOR) 10 MG tablet TAKE 1 TABLET BY MOUTH ONCE DAILY 03/18/21     canagliflozin (INVOKANA) 300 MG TABS tablet Take 1 tablet (300 mg total) by mouth once daily 03/18/21     cephALEXin (KEFLEX) 500 MG capsule Take 1 capsule (500 mg total) by mouth 4 (four) times daily. 05/17/16   Charm Rings, MD  levofloxacin (LEVAQUIN) 500 MG tablet Take 1 tablet (500 mg total) by mouth daily. 06/25/16   Fisher, Roselyn Bering, PA-C  lisinopril (ZESTRIL) 20 MG tablet Take 1 tablet (20 mg total) by mouth once daily 09/16/20     lisinopril (ZESTRIL)  20 MG tablet TAKE 1 TABLET BY MOUTH ONCE DAILY 03/18/21     Nirmatrelvir & Ritonavir (PAXLOVID) 20 x 150 MG & 10 x 100MG  TBPK Take 2 (two) pink tablets (300 mg nirmatrelvir) with 1 (one) tablet (100 mg ritonavir) by mouth twice a day for 5 days. 10/10/20     omeprazole (PRILOSEC) 20 MG capsule Take 1 capsule (20 mg total) by mouth once daily 09/16/20     omeprazole (PRILOSEC) 20 MG capsule TAKE 1 CAPSULE BY MOUTH ONCE DAILY 03/18/21       Family History Family History  Problem Relation Age of Onset   Diabetes Mother    Hypertension Father     Social History Social History   Tobacco Use   Smoking status: Every Day    Packs/day: 0.25    Years: 35.00    Pack years: 8.75    Types: Cigarettes   Smokeless tobacco: Never  Vaping Use   Vaping Use: Never used  Substance Use Topics   Alcohol use: Yes    Alcohol/week: 2.0 - 3.0 standard drinks    Types: 2 Cans of beer per week    Comment: rare   Drug use: No     Allergies   Penicillin g, Penicillins, Bupropion, and Escitalopram oxalate   Review of Systems Review of Systems  Constitutional:  Positive for chills and fever.  HENT:  Positive for congestion, ear pain, postnasal drip and sinus pressure. Negative for sore throat.   Respiratory:  Positive for cough. Negative for shortness of breath.   Cardiovascular:  Negative for chest pain and palpitations.  Gastrointestinal:  Negative for diarrhea and vomiting.  Skin:  Negative for color change and rash.  All other systems reviewed and are negative.   Physical Exam Triage Vital Signs ED Triage Vitals  Enc Vitals Group     BP      Pulse      Resp      Temp      Temp src      SpO2      Weight      Height      Head Circumference      Peak Flow      Pain Score      Pain Loc      Pain Edu?      Excl. in GC?    No data found.  Updated Vital Signs BP 128/80 (BP Location: Right Arm)   Pulse 90   Temp 99 F (37.2 C) (Oral)   Resp 14   SpO2 94%   Visual Acuity Right  Eye Distance:   Left Eye Distance:   Bilateral Distance:    Right Eye Near:   Left Eye Near:    Bilateral Near:     Physical Exam Vitals and nursing note reviewed.  Constitutional:  General: He is not in acute distress.    Appearance: He is well-developed.  HENT:     Head: Normocephalic and atraumatic.     Right Ear: Tympanic membrane normal.     Left Ear: Tympanic membrane normal.     Nose: Nose normal.     Mouth/Throat:     Mouth: Mucous membranes are moist.     Pharynx: Oropharynx is clear.  Eyes:     Conjunctiva/sclera: Conjunctivae normal.  Cardiovascular:     Rate and Rhythm: Normal rate and regular rhythm.     Heart sounds: Normal heart sounds.  Pulmonary:     Effort: Pulmonary effort is normal. No respiratory distress.     Breath sounds: Normal breath sounds.  Abdominal:     Palpations: Abdomen is soft.     Tenderness: There is no abdominal tenderness.  Musculoskeletal:     Cervical back: Neck supple.  Skin:    General: Skin is warm and dry.  Neurological:     Mental Status: He is alert.  Psychiatric:        Mood and Affect: Mood normal.        Behavior: Behavior normal.     UC Treatments / Results  Labs (all labs ordered are listed, but only abnormal results are displayed) Labs Reviewed - No data to display  EKG   Radiology No results found.  Procedures Procedures (including critical care time)  Medications Ordered in UC Medications - No data to display  Initial Impression / Assessment and Plan / UC Course  I have reviewed the triage vital signs and the nursing notes.  Pertinent labs & imaging results that were available during my care of the patient were reviewed by me and considered in my medical decision making (see chart for details).  Viral URI.  Patient declines COVID or flu test.  Treating with albuterol inhaler, prednisone taper, Tessalon Perles.  Discussed antibiotic stewardship.  Education provided on viral URI.  Instructed  patient to follow-up with his PCP if his symptoms are not improving.  He agrees to plan of care.   Final Clinical Impressions(s) / UC Diagnoses   Final diagnoses:  Viral URI     Discharge Instructions      Use the albuterol inhaler and take the prednisone as directed.  Take the [facility] as needed for cough.  Follow up with your primary care provider if your symptoms are not improving.         ED Prescriptions     Medication Sig Dispense Auth. Provider   benzonatate (TESSALON) 100 MG capsule Take 1 capsule (100 mg total) by mouth 3 (three) times daily as needed for cough. 21 capsule Mickie Bail, NP   albuterol (VENTOLIN HFA) 108 (90 Base) MCG/ACT inhaler Inhale 1-2 puffs into the lungs every 6 (six) hours as needed for wheezing or shortness of breath. 18 g Mickie Bail, NP   predniSONE (STERAPRED UNI-PAK 21 TAB) 10 MG (21) TBPK tablet Take by mouth daily. As directed 21 tablet Mickie Bail, NP      PDMP not reviewed this encounter.   Mickie Bail, NP 04/28/21 [phone]

## 2021-04-28 NOTE — ED Triage Notes (Signed)
Pt c/o cough, chills, low grade fever, sinus pressure/pain x 3 days. Pt states he has a tube in his left ear.

## 2021-06-11 ENCOUNTER — Other Ambulatory Visit: Payer: Self-pay

## 2021-06-11 MED ORDER — SILDENAFIL CITRATE 20 MG PO TABS
ORAL_TABLET | ORAL | 1 refills | Status: DC
  Filled 2021-06-11: qty 60, 20d supply, fill #0
  Filled 2021-09-12: qty 60, 20d supply, fill #1

## 2021-06-13 ENCOUNTER — Other Ambulatory Visit: Payer: Self-pay

## 2021-06-13 MED ORDER — INVOKANA 300 MG PO TABS
ORAL_TABLET | ORAL | 1 refills | Status: DC
  Filled 2021-06-13 – 2021-09-12 (×2): qty 90, 90d supply, fill #0

## 2021-06-23 DIAGNOSIS — H524 Presbyopia: Secondary | ICD-10-CM | POA: Diagnosis not present

## 2021-06-27 ENCOUNTER — Other Ambulatory Visit: Payer: Self-pay

## 2021-06-27 DIAGNOSIS — H9313 Tinnitus, bilateral: Secondary | ICD-10-CM | POA: Diagnosis not present

## 2021-06-27 DIAGNOSIS — H66002 Acute suppurative otitis media without spontaneous rupture of ear drum, left ear: Secondary | ICD-10-CM | POA: Diagnosis not present

## 2021-06-27 MED ORDER — CIPROFLOXACIN-DEXAMETHASONE 0.3-0.1 % OT SUSP
OTIC | 1 refills | Status: DC
  Filled 2021-06-27: qty 7.5, 10d supply, fill #0

## 2021-09-12 ENCOUNTER — Other Ambulatory Visit: Payer: Self-pay

## 2021-09-12 MED ORDER — LISINOPRIL 20 MG PO TABS
20.0000 mg | ORAL_TABLET | Freq: Every day | ORAL | 1 refills | Status: DC
  Filled 2021-09-12: qty 90, 90d supply, fill #0

## 2021-09-12 MED ORDER — ATORVASTATIN CALCIUM 10 MG PO TABS
10.0000 mg | ORAL_TABLET | Freq: Every day | ORAL | 1 refills | Status: DC
  Filled 2021-09-12: qty 90, 90d supply, fill #0

## 2021-09-12 MED ORDER — OMEPRAZOLE 20 MG PO CPDR
20.0000 mg | DELAYED_RELEASE_CAPSULE | Freq: Every day | ORAL | 1 refills | Status: DC
  Filled 2021-09-12: qty 90, 90d supply, fill #0

## 2021-10-13 ENCOUNTER — Other Ambulatory Visit: Payer: Self-pay

## 2021-10-13 ENCOUNTER — Inpatient Hospital Stay
Admission: RE | Admit: 2021-10-13 | Discharge: 2021-10-13 | Disposition: A | Discharge status: Home / Self Care | Payer: 59 | Source: Ambulatory Visit | Attending: Family Medicine | Admitting: Family Medicine

## 2021-10-13 ENCOUNTER — Encounter (HOSPITAL_COMMUNITY): Payer: Self-pay

## 2021-10-13 ENCOUNTER — Observation Stay (HOSPITAL_COMMUNITY)
Admission: EM | Admit: 2021-10-13 | Discharge: 2021-10-14 | Disposition: A | Discharge status: Home / Self Care | Payer: 59 | Attending: Family Medicine | Admitting: Family Medicine

## 2021-10-13 DIAGNOSIS — Z79899 Other long term (current) drug therapy: Secondary | ICD-10-CM | POA: Diagnosis not present

## 2021-10-13 DIAGNOSIS — Z7982 Long term (current) use of aspirin: Secondary | ICD-10-CM | POA: Diagnosis not present

## 2021-10-13 DIAGNOSIS — N179 Acute kidney failure, unspecified: Secondary | ICD-10-CM | POA: Diagnosis not present

## 2021-10-13 DIAGNOSIS — F17210 Nicotine dependence, cigarettes, uncomplicated: Secondary | ICD-10-CM | POA: Insufficient documentation

## 2021-10-13 DIAGNOSIS — I10 Essential (primary) hypertension: Secondary | ICD-10-CM | POA: Diagnosis not present

## 2021-10-13 DIAGNOSIS — L03116 Cellulitis of left lower limb: Principal | ICD-10-CM | POA: Diagnosis present

## 2021-10-13 DIAGNOSIS — R7989 Other specified abnormal findings of blood chemistry: Secondary | ICD-10-CM | POA: Diagnosis not present

## 2021-10-13 DIAGNOSIS — L0390 Cellulitis, unspecified: Secondary | ICD-10-CM | POA: Diagnosis present

## 2021-10-13 DIAGNOSIS — E1165 Type 2 diabetes mellitus with hyperglycemia: Secondary | ICD-10-CM | POA: Diagnosis present

## 2021-10-13 DIAGNOSIS — A419 Sepsis, unspecified organism: Secondary | ICD-10-CM | POA: Diagnosis not present

## 2021-10-13 DIAGNOSIS — R2242 Localized swelling, mass and lump, left lower limb: Secondary | ICD-10-CM | POA: Diagnosis present

## 2021-10-13 LAB — BASIC METABOLIC PANEL
Anion gap: 5 (ref 5–15)
BUN: 14 mg/dL (ref 6–20)
CO2: 23 mmol/L (ref 22–32)
Calcium: 9.5 mg/dL (ref 8.9–10.3)
Chloride: 106 mmol/L (ref 98–111)
Creatinine, Ser: 1.29 mg/dL — ABNORMAL HIGH (ref 0.61–1.24)
GFR, Estimated: >60 mL/min (ref >60)
Glucose, Bld: 181 mg/dL — ABNORMAL HIGH (ref 70–99)
Potassium: 4.1 mmol/L (ref 3.5–5.1)
Sodium: 134 mmol/L — ABNORMAL LOW (ref 135–145)

## 2021-10-13 LAB — CBC
HCT: 44.3 % (ref 39.0–52.0)
Hemoglobin: 15.3 g/dL (ref 13.0–17.0)
MCH: 31.4 pg (ref 26.0–34.0)
MCHC: 34.5 g/dL (ref 30.0–36.0)
MCV: 91 fL (ref 80.0–100.0)
Platelets: 112 10*3/uL — ABNORMAL LOW (ref 150–400)
RBC: 4.87 MIL/uL (ref 4.22–5.81)
RDW: 13.5 % (ref 11.5–15.5)
WBC: 14.7 10*3/uL — ABNORMAL HIGH (ref 4.0–10.5)
nRBC: 0 % (ref 0.0–0.2)

## 2021-10-13 LAB — LACTIC ACID, PLASMA
Lactic Acid, Venous: 1.5 mmol/L (ref 0.5–1.9)
Lactic Acid, Venous: 1.2 mmol/L (ref 0.5–1.9)

## 2021-10-13 MED ORDER — IBUPROFEN 800 MG PO TABS
800.0000 mg | ORAL_TABLET | Freq: Once | ORAL | Status: AC
Start: 2021-10-13 — End: 2021-10-13
  Administered 2021-10-13: 800 mg via ORAL
  Filled 2021-10-13: qty 1

## 2021-10-13 MED ORDER — SODIUM CHLORIDE 0.9 % IV SOLN
2.0000 g | Freq: Once | INTRAVENOUS | Status: AC
  Administered 2021-10-13: 2 g via INTRAVENOUS
  Filled 2021-10-13: qty 20

## 2021-10-13 MED ORDER — ACETAMINOPHEN 325 MG PO TABS
650.0000 mg | ORAL_TABLET | Freq: Once | ORAL | Status: AC
Start: 2021-10-13 — End: 2021-10-13
  Administered 2021-10-13: 650 mg via ORAL
  Filled 2021-10-13: qty 2

## 2021-10-13 MED ORDER — INSULIN ASPART 100 UNIT/ML IJ SOLN: 0.0000 [IU] | Freq: Three times a day (TID) | INTRAMUSCULAR | Status: DC

## 2021-10-13 MED ORDER — HYDRALAZINE HCL 20 MG/ML IJ SOLN: 5.0000 mg | INTRAMUSCULAR | Status: DC | PRN

## 2021-10-13 MED ORDER — ENOXAPARIN SODIUM 40 MG/0.4ML IJ SOSY
40.0000 mg | PREFILLED_SYRINGE | INTRAMUSCULAR | Status: DC
  Administered 2021-10-13: 40 mg via SUBCUTANEOUS
  Filled 2021-10-13: qty 0.4

## 2021-10-13 MED ORDER — ACETAMINOPHEN 325 MG PO TABS
650.0000 mg | ORAL_TABLET | Freq: Four times a day (QID) | ORAL | Status: DC | PRN
  Administered 2021-10-14 (×2): 650 mg via ORAL
  Filled 2021-10-13 (×2): qty 2

## 2021-10-13 MED ORDER — ATORVASTATIN CALCIUM 10 MG PO TABS
10.0000 mg | ORAL_TABLET | Freq: Every day | ORAL | Status: DC
  Administered 2021-10-14: 10 mg via ORAL
  Filled 2021-10-13: qty 1

## 2021-10-13 MED ORDER — ACETAMINOPHEN 650 MG RE SUPP: 650.0000 mg | Freq: Four times a day (QID) | RECTAL | Status: DC | PRN

## 2021-10-13 MED ORDER — LACTATED RINGERS IV BOLUS
1000.0000 mL | Freq: Once | INTRAVENOUS | Status: AC
  Administered 2021-10-13: 1000 mL via INTRAVENOUS

## 2021-10-13 MED ORDER — SODIUM CHLORIDE 0.9 % IV SOLN
2.0000 g | INTRAVENOUS | Status: DC
  Administered 2021-10-14: 2 g via INTRAVENOUS
  Filled 2021-10-13: qty 20

## 2021-10-13 MED ORDER — LACTATED RINGERS IV BOLUS
500.0000 mL | Freq: Once | INTRAVENOUS | Status: AC
  Administered 2021-10-13: 500 mL via INTRAVENOUS

## 2021-10-13 MED ORDER — LACTATED RINGERS IV SOLN: INTRAVENOUS | Status: DC

## 2021-10-13 NOTE — ED Provider Notes (Signed)
?[facility] ?Provider Note ? ? ?CSN: [id number] ?Arrival date & time: 10/13/21  1130 ? ?  ? ?History ? ?Chief Complaint  ?Patient presents with  ? Leg Swelling  ? ? ?Jeffrey [name] is a 61 y.o. male. ? ?Patient with history of diabetes, controlled, history of lower extremity cellulitis --presents to the emergency department today for evaluation of left lower extremity cellulitis.  Patient had an anterior shin wound last week.  He states that he struck the leg on a heater.  He cleaned with Betadine.  Yesterday while at a church service he developed chills, worsening pain and tenderness in the left lower extremity and foot.  He has had temperatures at home to 102 ?F controlled with Tylenol.  No nausea, vomiting.  Symptoms are consistent with similar episodes of cellulitis in the past.  Denies other immunocompromising states.  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 ? ? ?  ? ?Home Medications ?Prior to Admission medications   ?Medication Sig Start Date End Date Taking? Authorizing Provider  ?albuterol (VENTOLIN HFA) 108 (90 Base) MCG/ACT inhaler Inhale 1-2 puffs into the lungs every 6 (six) hours as needed for wheezing or shortness of breath. 04/28/21   Mickie Bailate, Kelly H, NP  ?aspirin 81 MG tablet Take 81 mg by mouth daily. Reported on 12/09/2015    [provider]  ?atorvastatin (LIPITOR) 10 MG tablet Take 1 tablet (10 mg total) by mouth once daily 09/16/20     ?atorvastatin (LIPITOR) 10 MG tablet TAKE 1 TABLET BY MOUTH ONCE DAILY 09/12/21     ?benzonatate (TESSALON) 100 MG capsule Take 1 capsule (100 mg total) by mouth 3 (three) times daily as needed for cough. 04/28/21   Mickie Bailate, Kelly H, NP  ?canagliflozin (INVOKANA) 300 MG TABS tablet Take 1 tablet by mouth daily. 08/30/15   [provider]  ?canagliflozin (INVOKANA) 300 MG TABS tablet Take 1 tablet (300 mg total) by mouth once daily 06/13/21     ?cephALEXin (KEFLEX) 500 MG  capsule Take 1 capsule (500 mg total) by mouth 4 (four) times daily. 05/17/16   Charm RingsHonig, Erin J, MD  ?CIALIS 5 MG tablet TAKE 1 TABLET BY MOUTH ONCE DAILY 04/16/16   Sherrie MustacheFisher, Roselyn BeringSusan W, PA-C  ?ciprofloxacin-dexamethasone (CIPRODEX) OTIC suspension 4 drops AS BID x 7 days 06/27/21     ?levofloxacin (LEVAQUIN) 500 MG tablet Take 1 tablet (500 mg total) by mouth daily. 06/25/16   Fisher, Roselyn BeringSusan W, PA-C  ?lisinopril (PRINIVIL,ZESTRIL) 20 MG tablet Take 20 mg by mouth daily.    [provider]  ?lisinopril (ZESTRIL) 20 MG tablet Take 1 tablet (20 mg total) by mouth once daily 09/16/20     ?lisinopril (ZESTRIL) 20 MG tablet TAKE 1 TABLET BY MOUTH ONCE DAILY 09/12/21     ?Nirmatrelvir & Ritonavir (PAXLOVID) 20 x 150 MG & 10 x 100MG  TBPK Take 2 (two) pink tablets (300 mg nirmatrelvir) with 1 (one) tablet (100 mg ritonavir) by mouth twice a day for 5 days. 10/10/20     ?omeprazole (PRILOSEC) 20 MG capsule TAKE 1 CAPSULE BY MOUTH ONCE A DAY 11/02/16   Sherrie MustacheFisher, Roselyn BeringSusan W, PA-C  ?omeprazole (PRILOSEC) 20 MG capsule Take 1 capsule (20 mg total) by mouth once daily 09/16/20     ?omeprazole (PRILOSEC) 20 MG capsule TAKE 1 CAPSULE BY MOUTH ONCE DAILY 09/12/21     ?predniSONE (DELTASONE) 10 MG tablet Take 6 tabs on day 1, 5 tabs on  day 2, 4 tabs on day 3, 3 tabs on day 4, 2 tabs on day 5, 1 tab on day 6. Then stop. 04/28/21   Mickie Bail, NP  ?sildenafil (REVATIO) 20 MG tablet TAKE 3 TABLETS BY MOUTH ONCE DAILY AS NEEDED 06/11/21     ?   ? ?Allergies    ?Penicillin g, Penicillins, Bupropion, and Escitalopram oxalate   ? ?Review of Systems   ?Review of Systems ? ?Physical Exam ?Updated Vital Signs ?BP 115/74 (BP Location: Left Arm)   Pulse 84   Temp 98.1 ?F (36.7 ?C) (Oral)   Resp 20   Ht 5\' 10"  (1.778 m)   Wt 120.2 kg   SpO2 94%   BMI 38.02 kg/m?  ? ?Physical Exam ?Vitals and nursing note reviewed.  ?Constitutional:   ?   General: He is not in acute distress. ?   Appearance: He is well-developed.  ?   Comments: Patient with shaking  chills  ?HENT:  ?   Head: Normocephalic and atraumatic.  ?Eyes:  ?   General:     ?   Right eye: No discharge.     ?   Left eye: No discharge.  ?   Conjunctiva/sclera: Conjunctivae normal.  ?Cardiovascular:  ?   Rate and Rhythm: Normal rate and regular rhythm.  ?   Heart sounds: Normal heart sounds.  ?Pulmonary:  ?   Effort: Pulmonary effort is normal.  ?   Breath sounds: Normal breath sounds.  ?Abdominal:  ?   Palpations: Abdomen is soft.  ?   Tenderness: There is no abdominal tenderness.  ?Musculoskeletal:  ?   Cervical back: Normal range of motion and neck supple.  ?Skin: ?   General: Skin is warm and dry.  ?   Comments: Left lower extremity cellulitis involving the foot and lower leg.  Fairly well demarcated but patchy.  Minimal induration.  No lymphangitis.  Slight scabbing from recent wound, anterior shin but proximal to cellulitis.   ?Neurological:  ?   Mental Status: He is alert.  ? ? ? ? ? ? ? ? ?ED Results / Procedures / Treatments   ?Labs ?(all labs ordered are listed, but only abnormal results are displayed) ?Labs Reviewed  ?CBC - Abnormal; Notable for the following components:  ?    Result Value  ? WBC 14.7 (*)   ? Platelets 112 (*)   ? All other components within normal limits  ?BASIC METABOLIC PANEL - Abnormal; Notable for the following components:  ? Sodium 134 (*)   ? Glucose, Bld 181 (*)   ? Creatinine, Ser 1.29 (*)   ? All other components within normal limits  ?CULTURE, BLOOD (ROUTINE X 2)  ?CULTURE, BLOOD (ROUTINE X 2)  ?LACTIC ACID, PLASMA  ?LACTIC ACID, PLASMA  ? ? ?EKG ?None ? ?Radiology ?No results found. ? ?Procedures ?Procedures  ? ? ?Medications Ordered in ED ?Medications  ?cefTRIAXone (ROCEPHIN) 2 g in sodium chloride 0.9 % 100 mL IVPB (has no administration in time range)  ?acetaminophen (TYLENOL) tablet 650 mg (has no administration in time range)  ? ? ?ED Course/ Medical Decision Making/ A&P ?  ? ?Patient seen and examined. History obtained directly from patient.  ? ?Labs/EKG:  Personally reviewed and interpreted CBC with white count 14.7, thrombocytopenia 112; BMP with glucose 181, creatinine 1.29.  Added lactate acid and blood cultures. ? ?Imaging: None ordered.  Considered DVT study however exam is consistent with cellulitis and not DVT. ? ?Medications/Fluids: Ordered IV  Rocephin 2 g, oral Tylenol.  Patient has a pen allergy but did receive IM Rocephin in 2017 and states he has had Augmentin in the past.  During visit in 05/2016 he was discharged home on Keflex. ? ?Most recent vital signs reviewed and are as follows: ?BP 115/74 (BP Location: Left Arm)   Pulse 84   Temp 98.1 ?F (36.7 ?C) (Oral)   Resp 20   Ht 5\' 10"  (1.778 m)   Wt 120.2 kg   SpO2 94%   BMI 38.02 kg/m?  ? ?Initial impression: Left lower extremity cellulitis, fever, in setting of diabetes.  ? ?3:17 PM Signout to Dr. . Horton at shift change. Plan: Lactate, recheck after IV antibiotics.  Possibly candidate for discharged home if symptoms remain controlled and cellulitis is not rapidly progressing. ? ? ?                        ?Medical Decision Making ?Amount and/or Complexity of Data Reviewed ?Labs: ordered. ? ?Risk ?OTC drugs. ? ? ?L LE cellulitis with fever and chills, non-toxic appearance.  ? ? ? ? ? ? ? ?Final Clinical Impression(s) / ED Diagnoses ?Final diagnoses:  ?Cellulitis of left lower extremity  ? ? ?Rx / DC Orders ?ED Discharge Orders   ? ? None  ? ?  ? ? ?  ?Sharlynn Oliphant, PA-C ?10/13/21 1530 ? ?  ?12/13/21, MD ?10/14/21 [phone] ? ?

## 2021-10-13 NOTE — H&P (Signed)
?History and Physical  ? ? ?Jeffrey [name] ZOX:[id number] DOB: 10/03/1960 DOA: 10/13/2021 ? ?PCP: Kandyce RudBabaoff, Marcus, MD  ?Patient coming from: Home. ? ?Chief Complaint: Left lower extremity erythema and fever chills. ? ?HPI: Jeffrey KannerDavid Anthony Diamant is a 61 y.o. male with history of diabetes mellitus type 2, hypertension, sleep apnea presents to the ER after patient noticed increasing left lower extremity erythema which is progressively worsening over the last 48 hours.  Patient states he had a small injury on the left anterior shin about a week ago.  And only 2 days ago he started developing erythema below the injury.  Now he also has streaking going up to the left groin.  He had fever and chills.  Initially felt mildly dizzy. ? ?Patient states he has had prior history of the same leg having strep bacteremia and cellulitis. ? ?ED Course: In the ER patient was febrile with temperature around 102 ?F initially hypotensive with systolic of 85 improved with fluid bolus.  Patient started on empiric antibiotics after blood cultures obtained.  Admitted for sepsis with cellulitis of the left lower extremity. ? ?Review of Systems: As per HPI, rest all negative. ? ? ?Past Medical History:  ?Diagnosis Date  ? Depression   ? Diabetes mellitus without complication (HCC)   ? GERD (gastroesophageal reflux disease)   ? Herniated disc   ? Hypertension   ? Sleep apnea uses c-pap  ? Wears glasses   ? ? ?Past Surgical History:  ?Procedure Laterality Date  ? ANKLE SURGERY    ? left fx  ? APPENDECTOMY    ? CARPAL TUNNEL RELEASE Right 10/05/2014  ? Procedure: RIGHT CARPAL TUNNEL RELEASE;  Surgeon: Dominica SeverinWilliam Gramig, MD;  Location: [facility];  Service: Orthopedics;  Laterality: Right;  ? COLONOSCOPY    ? LUMBAR LAMINECTOMY/DECOMPRESSION MICRODISCECTOMY  07/23/2011  ? Procedure: LUMBAR LAMINECTOMY/DECOMPRESSION MICRODISCECTOMY;  Surgeon: Jacki Conesonald A Gioffre, MD;  Location: [facility];  Service: Orthopedics;  Laterality: Right;  ? ULNAR  TUNNEL RELEASE Right 10/05/2014  ? Procedure: RIGHT CUBITAL TUNNEL RELEASE;  Surgeon: Dominica SeverinWilliam Gramig, MD;  Location: [facility];  Service: Orthopedics;  Laterality: Right;  ? UMBILICAL HERNIA REPAIR N/A 10/20/2014  ? Procedure: HERNIA REPAIR UMBILICAL ADULT;  Surgeon: Renda RollsWilton Smith, MD;  Location: [facility];  Service: General;  Laterality: N/A;  ? VASECTOMY    ? ? ? reports that he has been smoking cigarettes. He has a 8.75 pack-year smoking history. He has never used smokeless tobacco. He reports current alcohol use of about 2.0 - 3.0 standard drinks per week. He reports that he does not use drugs. ? ?Allergies  ?Allergen Reactions  ? Penicillin G Anaphylaxis  ? Penicillins Anaphylaxis  ? Bupropion Other (See Comments)  ?  Mental status change.  ? Escitalopram Oxalate Other (See Comments)  ?  Delayed ejaculation.  ? ? ?Family History  ?Problem Relation Age of Onset  ? Diabetes Mother   ? Hypertension Father   ? ? ?Prior to Admission medications   ?Medication Sig Start Date End Date Taking? Authorizing Provider  ?albuterol (VENTOLIN HFA) 108 (90 Base) MCG/ACT inhaler Inhale 1-2 puffs into the lungs every 6 (six) hours as needed for wheezing or shortness of breath. 04/28/21  Yes Mickie Bailate, Kelly H, NP  ?atorvastatin (LIPITOR) 10 MG tablet Take 1 tablet (10 mg total) by mouth once daily 09/16/20  Yes   ?canagliflozin (INVOKANA) 300 MG TABS tablet Take 1 tablet by mouth daily. 08/30/15  Yes [provider]  ?  lisinopril (PRINIVIL,ZESTRIL) 20 MG tablet Take 20 mg by mouth daily.   Yes [provider]  ?omeprazole (PRILOSEC) 20 MG capsule TAKE 1 CAPSULE BY MOUTH ONCE A DAY ?Patient taking differently: Take 20 mg by mouth daily. 11/02/16  Yes Fisher, Roselyn Bering, PA-C  ?sildenafil (REVATIO) 20 MG tablet TAKE 3 TABLETS BY MOUTH ONCE DAILY AS NEEDED ?Patient taking differently: Take 20 mg by mouth daily as needed (Erectial dyfunction). 06/11/21  Yes   ?atorvastatin (LIPITOR) 10 MG tablet TAKE 1 TABLET BY MOUTH  ONCE DAILY ?Patient not taking: Reported on 10/13/2021 09/12/21     ?benzonatate (TESSALON) 100 MG capsule Take 1 capsule (100 mg total) by mouth 3 (three) times daily as needed for cough. ?Patient not taking: Reported on 10/13/2021 04/28/21   Mickie Bail, NP  ?canagliflozin (INVOKANA) 300 MG TABS tablet Take 1 tablet (300 mg total) by mouth once daily ?Patient not taking: Reported on 10/13/2021 06/13/21     ?CIALIS 5 MG tablet TAKE 1 TABLET BY MOUTH ONCE DAILY ?Patient not taking: Reported on 10/13/2021 04/16/16   Faythe Ghee, PA-C  ?ciprofloxacin-dexamethasone (CIPRODEX) OTIC suspension 4 drops AS BID x 7 days ?Patient not taking: Reported on 10/13/2021 06/27/21     ?lisinopril (ZESTRIL) 20 MG tablet Take 1 tablet (20 mg total) by mouth once daily ?Patient not taking: Reported on 10/13/2021 09/16/20     ?lisinopril (ZESTRIL) 20 MG tablet TAKE 1 TABLET BY MOUTH ONCE DAILY ?Patient not taking: Reported on 10/13/2021 09/12/21     ?omeprazole (PRILOSEC) 20 MG capsule Take 1 capsule (20 mg total) by mouth once daily ?Patient not taking: Reported on 10/13/2021 09/16/20     ?omeprazole (PRILOSEC) 20 MG capsule TAKE 1 CAPSULE BY MOUTH ONCE DAILY ?Patient not taking: Reported on 10/13/2021 09/12/21     ?predniSONE (DELTASONE) 10 MG tablet Take 6 tabs on day 1, 5 tabs on day 2, 4 tabs on day 3, 3 tabs on day 4, 2 tabs on day 5, 1 tab on day 6. Then stop. ?Patient not taking: Reported on 10/13/2021 04/28/21   Mickie Bail, NP  ? ? ?Physical Exam: ?Constitutional: Moderately built and nourished. ?Vitals:  ? 10/13/21 1930 10/13/21 1945 10/13/21 2000 10/13/21 2015  ?BP: 107/74 100/61 105/61 97/65  ?Pulse: 74 81 81 80  ?Resp: (!) 23 (!) 21 (!) 26 18  ?Temp:      ?TempSrc:      ?SpO2: 95% 93% 94% 94%  ?Weight:      ?Height:      ? ?Eyes: Anicteric no pallor. ?ENMT: No discharge from the ears eyes nose and mouth. ?Neck: No mass felt.  No neck rigidity. ?Respiratory: No rhonchi or crepitations. ?Cardiovascular: S1-S2 heard. ?Abdomen: Soft nontender  bowel sound present. ?Musculoskeletal: Left lower extremity is swollen erythematous up to the inferior aspect of the left knee.  No signs of acute ischemia or compartment syndrome. ?Skin: Erythema of the left lower extremity extending from the foot up to the inferior aspect of the left thigh.  Has a small injury seen below the left knee. ?Neurologic: Alert awake oriented time place and person.  Moves all extremities. ?Psychiatric: Appears normal.  Normal affect. ? ? ?Labs on Admission: I have personally reviewed following labs and imaging studies ? ?CBC: ?Recent Labs  ?Lab 10/13/21 ?1216  ?WBC 14.7*  ?HGB 15.3  ?HCT 44.3  ?MCV 91.0  ?PLT 112*  ? ?Basic Metabolic Panel: ?Recent Labs  ?Lab 10/13/21 ?1216  ?NA 134*  ?K 4.1  ?  CL 106  ?CO2 23  ?GLUCOSE 181*  ?BUN 14  ?CREATININE 1.29*  ?CALCIUM 9.5  ? ?GFR: ?Estimated Creatinine Clearance: 79.2 mL/min (A) (by C-G formula based on SCr of 1.29 mg/dL (H)). ?Liver Function Tests: ?No results for input(s): AST, ALT, ALKPHOS, BILITOT, PROT, ALBUMIN in the last 168 hours. ?No results for input(s): LIPASE, AMYLASE in the last 168 hours. ?No results for input(s): AMMONIA in the last 168 hours. ?Coagulation Profile: ?No results for input(s): INR, PROTIME in the last 168 hours. ?Cardiac Enzymes: ?No results for input(s): CKTOTAL, CKMB, CKMBINDEX, TROPONINI in the last 168 hours. ?BNP (last 3 results) ?No results for input(s): PROBNP in the last 8760 hours. ?HbA1C: ?No results for input(s): HGBA1C in the last 72 hours. ?CBG: ?No results for input(s): GLUCAP in the last 168 hours. ?Lipid Profile: ?No results for input(s): CHOL, HDL, LDLCALC, TRIG, CHOLHDL, LDLDIRECT in the last 72 hours. ?Thyroid Function Tests: ?No results for input(s): TSH, T4TOTAL, FREET4, T3FREE, THYROIDAB in the last 72 hours. ?Anemia Panel: ?No results for input(s): VITAMINB12, FOLATE, FERRITIN, TIBC, IRON, RETICCTPCT in the last 72 hours. ?Urine analysis: ?   ?Component Value Date/Time  ? COLORURINE YELLOW  02/05/2012 1804  ? APPEARANCEUR CLEAR 02/05/2012 1804  ? LABSPEC 1.026 02/05/2012 1804  ? PHURINE 6.5 02/05/2012 1804  ? GLUCOSEU 100 (A) 02/05/2012 1804  ? HGBUR NEGATIVE 02/05/2012 1804  ? BILIRUBINUR NEGATI

## 2021-10-13 NOTE — ED Triage Notes (Signed)
Pt reports redness and swelling to his left leg, fevers overnight. Hx of cellulitis ?

## 2021-10-13 NOTE — ED Provider Notes (Signed)
Transfer of Care Note ?I assumed care of Jeffrey [name] on 10/13/2021 at 1500 ?Briefly, Jeffrey [name] is a 61 y.o. male who: ?- Hx DM, LLE swelling and fever  ?- S/p Rocephin  ? ?  ?No orders to display  ? ?The plan includes: ?- reassess, and shared medical decision  ? ? ?Please refer to the original provider?s note for additional information regarding the care of Jeffrey [name]. ? ?### ?Reassessment: ?I personally reassessed the patient: Patient reports significant improvement of his symptoms. ? ?Vitals:  ? 10/13/21 2000 10/13/21 2015  ?BP: 105/61 97/65  ?Pulse: 81 80  ?Resp: (!) 26 18  ?Temp:    ?SpO2: 94% 94%  ? ?MDM: ? ?-Patient did get hypotensive.  Have given him a liter of LR.  Mild improvement of his blood pressure.  Will provide another LR.  His presentation is concerning for sepsis secondary to his left lower extremity cellulitis.  His fever has resolved.  However area of cellulitis is slowly expanding.  I believe patient will require admission for antibiotic treatment.  Patient understand and agrees regarding admission decision.  All pertinent information given to admitting team. ? ?Dispo: Admitted  ? ?  ?Jari Sportsman, MD ?10/13/21 2133 ? ?  ?Rozelle Logan, DO ?10/13/21 2345 ? ?

## 2021-10-13 NOTE — ED Provider Triage Note (Signed)
Emergency Medicine Provider Triage Evaluation Note ? ?Jeffrey [name] , a 61 y.o. male  was evaluated in triage.  Pt complains of left lower extremity swelling.  Patient states since yesterday he has noticed increased redness, swelling to his left lower extremity.  The patient reports he has a history of "strep cellulitis" and has been seen by Dr. Megan Salon of infectious disease.  Patient states that the dorsum of his foot began following yesterday and last night began to appear erythematous.  Patient also complaining of fevers, body aches and chills since then.  Patient denies any nausea, vomiting.  Patient has history of diabetes. ? ?Review of Systems  ?Positive:  ?Negative:  ? ?Physical Exam  ?BP 115/74 (BP Location: Left Arm)   Pulse 84   Temp 98.1 ?F (36.7 ?C) (Oral)   Resp 20   Ht 5\' 10"  (1.778 m)   Wt 120.2 kg   SpO2 94%   BMI 38.02 kg/m?  ?Gen:   Awake, no distress   ?Resp:  Normal effort  ?MSK:   Moves extremities without difficulty  ?Other:  Diffuse tenderness to palpation of entire left lower extremity.  Swelling and erythema present. ? ?Medical Decision Making  ?Medically screening exam initiated at 12:12 PM.  Appropriate orders placed.  Jeffrey [name] was informed that the remainder of the evaluation will be completed by another provider, this initial triage assessment does not replace that evaluation, and the importance of remaining in the ED until their evaluation is complete. ? ? ?  ?Azucena Cecil, PA-C ?10/13/21 1213 ? ?

## 2021-10-13 NOTE — Plan of Care (Signed)

## 2021-10-14 ENCOUNTER — Other Ambulatory Visit: Payer: Self-pay

## 2021-10-14 DIAGNOSIS — R7989 Other specified abnormal findings of blood chemistry: Secondary | ICD-10-CM | POA: Diagnosis not present

## 2021-10-14 DIAGNOSIS — E1165 Type 2 diabetes mellitus with hyperglycemia: Secondary | ICD-10-CM | POA: Diagnosis not present

## 2021-10-14 DIAGNOSIS — I10 Essential (primary) hypertension: Secondary | ICD-10-CM

## 2021-10-14 DIAGNOSIS — L03116 Cellulitis of left lower limb: Secondary | ICD-10-CM | POA: Diagnosis not present

## 2021-10-14 DIAGNOSIS — F17210 Nicotine dependence, cigarettes, uncomplicated: Secondary | ICD-10-CM | POA: Diagnosis not present

## 2021-10-14 DIAGNOSIS — N179 Acute kidney failure, unspecified: Secondary | ICD-10-CM | POA: Diagnosis not present

## 2021-10-14 DIAGNOSIS — Z79899 Other long term (current) drug therapy: Secondary | ICD-10-CM | POA: Diagnosis not present

## 2021-10-14 DIAGNOSIS — Z7982 Long term (current) use of aspirin: Secondary | ICD-10-CM | POA: Diagnosis not present

## 2021-10-14 DIAGNOSIS — A419 Sepsis, unspecified organism: Secondary | ICD-10-CM | POA: Diagnosis not present

## 2021-10-14 LAB — BASIC METABOLIC PANEL
Anion gap: 10 (ref 5–15)
BUN: 14 mg/dL (ref 6–20)
CO2: 20 mmol/L — ABNORMAL LOW (ref 22–32)
Calcium: 8.5 mg/dL — ABNORMAL LOW (ref 8.9–10.3)
Chloride: 104 mmol/L (ref 98–111)
Creatinine, Ser: 1.25 mg/dL — ABNORMAL HIGH (ref 0.61–1.24)
GFR, Estimated: >60 mL/min (ref >60)
Glucose, Bld: 106 mg/dL — ABNORMAL HIGH (ref 70–99)
Potassium: 3.6 mmol/L (ref 3.5–5.1)
Sodium: 134 mmol/L — ABNORMAL LOW (ref 135–145)

## 2021-10-14 LAB — GLUCOSE, CAPILLARY
Glucose-Capillary: 162 mg/dL — ABNORMAL HIGH (ref 70–99)
Glucose-Capillary: 123 mg/dL — ABNORMAL HIGH (ref 70–99)

## 2021-10-14 LAB — CBC
HCT: 40.6 % (ref 39.0–52.0)
Hemoglobin: 14 g/dL (ref 13.0–17.0)
MCH: 30.9 pg (ref 26.0–34.0)
MCHC: 34.5 g/dL (ref 30.0–36.0)
MCV: 89.6 fL (ref 80.0–100.0)
Platelets: 94 10*3/uL — ABNORMAL LOW (ref 150–400)
RBC: 4.53 MIL/uL (ref 4.22–5.81)
RDW: 13.5 % (ref 11.5–15.5)
WBC: 12 10*3/uL — ABNORMAL HIGH (ref 4.0–10.5)
nRBC: 0 % (ref 0.0–0.2)

## 2021-10-14 LAB — HIV ANTIBODY (ROUTINE TESTING W REFLEX): HIV Screen 4th Generation wRfx: NONREACTIVE

## 2021-10-14 MED ORDER — AMOXICILLIN 500 MG PO CAPS: 1000.0000 mg | ORAL_CAPSULE | Freq: Three times a day (TID) | ORAL | Status: DC

## 2021-10-14 MED ORDER — CEFADROXIL 500 MG PO CAPS
1000.0000 mg | ORAL_CAPSULE | Freq: Two times a day (BID) | ORAL | 0 refills | Status: DC
  Filled 2021-10-14: qty 28, 7d supply, fill #0

## 2021-10-14 MED ORDER — IBUPROFEN 200 MG PO TABS: 400.0000 mg | ORAL_TABLET | Freq: Four times a day (QID) | ORAL | Status: DC | PRN

## 2021-10-14 MED ORDER — IBUPROFEN 200 MG PO TABS
200.0000 mg | ORAL_TABLET | Freq: Once | ORAL | Status: AC
  Administered 2021-10-14: 200 mg via ORAL
  Filled 2021-10-14: qty 1

## 2021-10-14 MED ORDER — ACETAMINOPHEN 500 MG PO TABS: 1000.0000 mg | ORAL_TABLET | Freq: Four times a day (QID) | ORAL | Status: DC | PRN

## 2021-10-14 MED ORDER — ALBUTEROL SULFATE (2.5 MG/3ML) 0.083% IN NEBU: 2.5000 mg | INHALATION_SOLUTION | Freq: Four times a day (QID) | RESPIRATORY_TRACT | Status: DC | PRN

## 2021-10-14 MED ORDER — CEFADROXIL 500 MG PO CAPS
1000.0000 mg | ORAL_CAPSULE | Freq: Two times a day (BID) | ORAL | Status: DC
  Administered 2021-10-14: 1000 mg via ORAL
  Filled 2021-10-14 (×2): qty 2

## 2021-10-14 MED ORDER — LISINOPRIL 20 MG PO TABS: 20.0000 mg | ORAL_TABLET | Freq: Every day | ORAL | Status: DC | PRN

## 2021-10-14 MED ORDER — CANAGLIFLOZIN 300 MG PO TABS
300.0000 mg | ORAL_TABLET | Freq: Every day | ORAL | Status: DC
  Administered 2021-10-14: 300 mg via ORAL
  Filled 2021-10-14: qty 1

## 2021-10-14 MED ORDER — IBUPROFEN 200 MG PO TABS: 600.0000 mg | ORAL_TABLET | Freq: Four times a day (QID) | ORAL | Status: DC | PRN

## 2021-10-14 NOTE — Progress Notes (Signed)
Patient and patient's family member is having concern about the care regarding his leg and the ordered abx. Patient is requesting an Infectious Disease doctor or Dr. Orvan Falconer, who treated him previously for his current condition. Patient and patient's family member stated they will be leaving today if the patient's plan of care is not modified. Day shift charge nurse and patient's primary nurse for day shift made aware of the situation. ?

## 2021-10-14 NOTE — Progress Notes (Signed)
Not cpaps available at this time, will continue to look for unit and will bring one to pt if found.. Made RN and pt aware. ?

## 2021-10-14 NOTE — Consult Note (Signed)
? ?  [facility] CM Inpatient Consult ? ? ?10/14/2021 ? ?[name] ?1960-11-24 ?[id number] ? ? [facility] Customer service manager [[employer]]  Accountable Care Organization [ACO] Patient: Jeffrey [name] ? ?Primary Care Provider:  Kandyce Rud, MD, [facility] ? ? ?Patient has been currently transitioned home when rounding on unit ?  ? [name]: Patient will be followed by [facility] Care Coordinator, if PCP office does not provide TOC.  Will follow up for referral. ?  ?For additional questions or referrals please contact: ?  ?Charlesetta Shanks, RN BSN CCM ?[facility] [employer] Liaison ? [phone] business mobile phone ?Toll free office [phone]  ?Fax number: [phone] ?[email or web address] ?[email or web address]  ? ?

## 2021-10-14 NOTE — Discharge Summary (Signed)
?Physician Discharge Summary ?  ?Patient: Jeffrey [name] MRN: [id number] DOB: 04-17-1961  ?Admit date:     10/13/2021  ?Discharge date: 10/14/21  ?Discharge Physician: Tyrone Nine  ? ?PCP: Kandyce Rud, MD  ? ?Recommendations at discharge:  ?Continue cefadroxil twice daily for 7 days for erysipelas per ID. Pt to follow up with Dr. Rivka Safer in 48 hours.  ? ?Discharge Diagnoses: ?Principal Problem: ?  Cellulitis of left lower extremity ?Active Problems: ?  Cellulitis ?  Essential hypertension ?  Controlled type 2 diabetes mellitus with hyperglycemia (HCC) ? ?Hospital Course: ?Jeffrey [name] is a 61 y.o. male with history of diabetes mellitus type 2, hypertension, sleep apnea presents to the ER after patient noticed increasing left lower extremity erythema which is progressively worsening over the last 48 hours. He was febrile with temperature up to 102.4?F initially hypotensive with systolic of 85 improved with fluid bolus. Patient started on empiric antibiotics after blood cultures obtained. Lactic acid normal. He feels stable and is requesting discharge. ID was consulted, recommended transition ceftriaxone 2g IV q24h > cefadroxil 1,000mg  BID at discharge, has follow up secured in 48 hours. ? ?Assessment and Plan: ?Sepsis due to LLE cellulitis/erysipelas: Stable, improving leukocytosis and fever curve. No evidence of deep infection at this time. Diabetes is chronically and acutely well-controlled.  ?- ID, Dr. Luciana Axe, consulted and recommends transition to cefadroxil (has anaphylaxis to PCN but has tolerated keflex). Sent to pharmacy, and will f/u 5/4.  ?- Monitor blood cultures drawn at admission, no growth at time of discharge. ?- Elevate LLE.  ? ?HTN: Ok to restart home med. ? ?T2DM: Continue invokana ? ?OSA: Continue CPAP qHS ? ?Elevated creatinine: Unclear baseline (last value is from 2013), though with his size, this still represents CrCl >57ml/min and pt appears euvolemic. Stable with IV fluids.  Do not believe we can diagnose AKI or CKD at this time.  ? ?Consultants: ID ?Procedures performed: None  ?Disposition: Home ?Diet recommendation:  ?Carb modified diet ?DISCHARGE MEDICATION: ?Allergies as of 10/14/2021   ? ?   Reactions  ? Penicillin G Anaphylaxis  ? Penicillins Anaphylaxis  ? Bupropion Other (See Comments)  ? Mental status change.  ? Escitalopram Oxalate Other (See Comments)  ? Delayed ejaculation.  ? ?  ? ?  ?Medication List  ?  ? ?STOP taking these medications   ? ?benzonatate 100 MG capsule ?Commonly known as: TESSALON ?  ?Cialis 5 MG tablet ?Generic drug: tadalafil ?  ?ciprofloxacin-dexamethasone OTIC suspension ?Commonly known as: Ciprodex ?  ?predniSONE 10 MG tablet ?Commonly known as: DELTASONE ?  ? ?  ? ?TAKE these medications   ? ?albuterol 108 (90 Base) MCG/ACT inhaler ?Commonly known as: VENTOLIN HFA ?Inhale 1-2 puffs into the lungs every 6 (six) hours as needed for wheezing or shortness of breath. ?  ?atorvastatin 10 MG tablet ?Commonly known as: LIPITOR ?Take 1 tablet (10 mg total) by mouth once daily ?What changed: Another medication with the same name was removed. Continue taking this medication, and follow the directions you see here. ?  ?canagliflozin 300 MG Tabs tablet ?Commonly known as: INVOKANA ?Take 1 tablet by mouth daily. ?What changed: Another medication with the same name was removed. Continue taking this medication, and follow the directions you see here. ?  ?cefadroxil 500 MG capsule ?Commonly known as: DURICEF ?Take 2 capsules (1,000 mg total) by mouth 2 (two) times daily for 7 days. ?  ?lisinopril 20 MG tablet ?Commonly known as: ZESTRIL ?Take 20 mg  by mouth daily. ?What changed: Another medication with the same name was removed. Continue taking this medication, and follow the directions you see here. ?  ?omeprazole 20 MG capsule ?Commonly known as: PRILOSEC ?TAKE 1 CAPSULE BY MOUTH ONCE A DAY ?What changed: Another medication with the same name was removed. Continue  taking this medication, and follow the directions you see here. ?  ?sildenafil 20 MG tablet ?Commonly known as: REVATIO ?TAKE 3 TABLETS BY MOUTH ONCE DAILY AS NEEDED ?What changed:  ?how much to take ?how to take this ?when to take this ?reasons to take this ?  ? ?  ? ? Follow-up Information   ? ? Kandyce Rud, MD Follow up.   ?Specialty: Family Medicine ?Contact information: ?[address] ?[facility] ?[place] [address] ?[phone] ? ? ?  ?  ? ? Lynn Ito, MD. Go on 10/16/2021.   ?Specialty: Infectious Diseases ?Why: 10:30am ?Contact information: ?[address] ?[place] [address] ?[phone] ? ? ?  ?  ? ?  ?  ? ?  ? ?Discharge Exam: ?Filed Weights  ? 10/13/21 1156  ?Weight: 120.2 kg  ?BP 136/71   Pulse 83   Temp 98.8 ?F (37.1 ?C) (Oral)   Resp 20   Ht 5\' 10"  (1.778 m)   Wt 120.2 kg   SpO2 97%   BMI 38.02 kg/m?   ?Nontoxic, well-appearing, jovial ?Clear, nonlabored ?RRR, no MRG ?LLE with palpable tender erythema with diffuse edema, mostly involving distal lower leg and foot/ankle without tenderness to joint specifically, actually receding from demarcations from yesterday. Lighter mild erythema extends up thigh with palpable inguinal lymphadenopathy. No fluctuance ? ?Condition at discharge: stable ? ?The results of significant diagnostics from this hospitalization (including imaging, microbiology, ancillary and laboratory) are listed below for reference.  ? ?Imaging Studies: ?No results found. ? ?Microbiology: ?Results for orders placed or performed during the hospital encounter of 07/22/11  ?Surgical pcr screen     Status: None  ? Collection Time: 07/22/11  8:49 AM  ? Specimen: Nose; Nasal Swab  ?Result Value Ref Range Status  ? MRSA, PCR NEGATIVE NEGATIVE Final  ? Staphylococcus aureus NEGATIVE NEGATIVE Final  ?  Comment:        ?The Xpert SA Assay (FDA ?approved for NASAL specimens ?only), is one component of ?a comprehensive  surveillance ?program.  It is not intended ?to diagnose infection nor to ?guide or monitor treatment.  ? ? ?Labs: ?CBC: ?Recent Labs  ?Lab 10/13/21 ?1216 10/14/21 ?0600  ?WBC 14.7* 12.0*  ?HGB 15.3 14.0  ?HCT 44.3 40.6  ?MCV 91.0 89.6  ?PLT 112* 94*  ? ?Basic Metabolic Panel: ?Recent Labs  ?Lab 10/13/21 ?1216 10/14/21 ?0600  ?NA 134* 134*  ?K 4.1 3.6  ?CL 106 104  ?CO2 23 20*  ?GLUCOSE 181* 106*  ?BUN 14 14  ?CREATININE 1.29* 1.25*  ?CALCIUM 9.5 8.5*  ? ?Liver Function Tests: ?No results for input(s): AST, ALT, ALKPHOS, BILITOT, PROT, ALBUMIN in the last 168 hours. ?CBG: ?Recent Labs  ?Lab 10/14/21 ?0901 10/14/21 ?1154  ?GLUCAP 162* 123*  ? ? ?Discharge time spent: greater than 30 minutes. ? ?Signed: ?12/14/21, MD ?[facility] ?10/14/2021 ?

## 2021-10-14 NOTE — Progress Notes (Signed)
Pt. Refuse to wear Cardiac monitor, pt took all leads off. CCMD and Doc notified ?

## 2021-10-14 NOTE — Plan of Care (Signed)
?  Problem: Education: ?Goal: Knowledge of General Education information will improve ?Description: Including pain rating scale, medication(s)/side effects and non-pharmacologic comfort measures ?10/14/2021 1359 by Artis Beggs, Quincy Carnes, LPN ?Outcome: Adequate for Discharge ?10/14/2021 1359 by Nashon Erbes, Quincy Carnes, LPN ?Outcome: Adequate for Discharge ?  ?Problem: Health Behavior/Discharge Planning: ?Goal: Ability to manage health-related needs will improve ?10/14/2021 1359 by Larrisa Cravey, Quincy Carnes, LPN ?Outcome: Adequate for Discharge ?10/14/2021 1359 by Orla Jolliff, Quincy Carnes, LPN ?Outcome: Adequate for Discharge ?  ?Problem: Clinical Measurements: ?Goal: Ability to maintain clinical measurements within normal limits will improve ?10/14/2021 1359 by Farah Benish, Quincy Carnes, LPN ?Outcome: Adequate for Discharge ?10/14/2021 1359 by Graylon Amory, Quincy Carnes, LPN ?Outcome: Adequate for Discharge ?Goal: Will remain free from infection ?10/14/2021 1359 by Brenan Modesto, Quincy Carnes, LPN ?Outcome: Adequate for Discharge ?10/14/2021 1359 by Lonie Rummell, Quincy Carnes, LPN ?Outcome: Adequate for Discharge ?Goal: Diagnostic test results will improve ?10/14/2021 1359 by Kasi Lasky, Quincy Carnes, LPN ?Outcome: Adequate for Discharge ?10/14/2021 1359 by Nnenna Meador, Quincy Carnes, LPN ?Outcome: Adequate for Discharge ?Goal: Respiratory complications will improve ?10/14/2021 1359 by Kapono Luhn, Quincy Carnes, LPN ?Outcome: Adequate for Discharge ?10/14/2021 1359 by Kol Consuegra, Quincy Carnes, LPN ?Outcome: Adequate for Discharge ?Goal: Cardiovascular complication will be avoided ?10/14/2021 1359 by Zelta Enfield, Quincy Carnes, LPN ?Outcome: Adequate for Discharge ?10/14/2021 1359 by Josias Tomerlin, Quincy Carnes, LPN ?Outcome: Adequate for Discharge ?  ?Problem: Activity: ?Goal: Risk for activity intolerance will decrease ?10/14/2021 1359 by Wise Fees, Quincy Carnes, LPN ?Outcome: Adequate for Discharge ?10/14/2021 1359 by Leilanie Rauda, Quincy Carnes, LPN ?Outcome: Adequate for Discharge ?  ?Problem: Nutrition: ?Goal: Adequate nutrition will be  maintained ?10/14/2021 1359 by Kylil Swopes, Quincy Carnes, LPN ?Outcome: Adequate for Discharge ?10/14/2021 1359 by Annalei Friesz, Quincy Carnes, LPN ?Outcome: Adequate for Discharge ?  ?Problem: Coping: ?Goal: Level of anxiety will decrease ?10/14/2021 1359 by Daryle Amis, Quincy Carnes, LPN ?Outcome: Adequate for Discharge ?10/14/2021 1359 by Lakai Moree, Quincy Carnes, LPN ?Outcome: Adequate for Discharge ?  ?Problem: Elimination: ?Goal: Will not experience complications related to bowel motility ?10/14/2021 1359 by Eloyce Bultman, Quincy Carnes, LPN ?Outcome: Adequate for Discharge ?10/14/2021 1359 by Aviendha Azbell, Quincy Carnes, LPN ?Outcome: Adequate for Discharge ?Goal: Will not experience complications related to urinary retention ?10/14/2021 1359 by Tabius Rood, Quincy Carnes, LPN ?Outcome: Adequate for Discharge ?10/14/2021 1359 by Arfa Lamarca, Quincy Carnes, LPN ?Outcome: Adequate for Discharge ?  ?Problem: Pain Managment: ?Goal: General experience of comfort will improve ?10/14/2021 1359 by Dalia Jollie, Quincy Carnes, LPN ?Outcome: Adequate for Discharge ?10/14/2021 1359 by Josceline Chenard, Quincy Carnes, LPN ?Outcome: Adequate for Discharge ?  ?Problem: Safety: ?Goal: Ability to remain free from injury will improve ?10/14/2021 1359 by Aliou Mealey L, LPN ?Outcome: Adequate for Discharge ?10/14/2021 1359 by Thalia Turkington, Quincy Carnes, LPN ?Outcome: Adequate for Discharge ?  ?Problem: Skin Integrity: ?Goal: Risk for impaired skin integrity will decrease ?10/14/2021 1359 by Ardelle Haliburton, Quincy Carnes, LPN ?Outcome: Adequate for Discharge ?10/14/2021 1359 by Dalayah Deahl, Quincy Carnes, LPN ?Outcome: Adequate for Discharge ?  ?

## 2021-10-14 NOTE — Consult Note (Signed)
?  [facility]  ? ? ? ? ? ?Reason for Consult: erysipelas    ?Referring Physician: Dr. Jarvis Newcomer ? ?Principal Problem: ?  Cellulitis of left lower extremity ?Active Problems: ?  Cellulitis ?  Essential hypertension ?  Controlled type 2 diabetes mellitus with hyperglycemia (HCC) ? ? ? amoxicillin  1,000 mg Oral Q8H  ? atorvastatin  10 mg Oral Daily  ? canagliflozin  300 mg Oral Daily  ? enoxaparin (LOVENOX) injection  40 mg Subcutaneous Q24H  ? ? ?Recommendations: ?Cefadroxil 1000 mg orally twice a day for 7 days ?Follow up with Dr. Rivka Safer ? ?Ok for discharge from an ID standpoint ? ?Assessment: ?He has fever, left leg skin findings consistent with erysipelas.  Most typically Streptococcus and improved on ceftriaxone.  Normal lactate.  ?Plan as above with oral antibiotics and close follow up.   ? ?Antibiotics: ?Ceftriaxone day 2 ? ?HPI: Jeffrey [name] is a 61 y.o. male with a history of diabetes, hypertension presented to the ED with concern for skin infection.  Fever up to 103, some hypotension that resolved with some fluids.  Has a history of cellulitis in the past.  Some dizziness that has resolved.  Blood cultures ngtd.  He feels well and asking about going home.   ? ? ?Review of Systems: ? Constitutional: negative for chills ?Gastrointestinal: negative for nausea, vomiting, and diarrhea ?All other systems reviewed and are negative   ? ?Past Medical History:  ?Diagnosis Date  ? Depression   ? Diabetes mellitus without complication (HCC)   ? GERD (gastroesophageal reflux disease)   ? Herniated disc   ? Hypertension   ? Sleep apnea uses c-pap  ? Wears glasses   ? ? ?Social History  ? ?Tobacco Use  ? Smoking status: Every Day  ?  Packs/day: 0.25  ?  Years: 35.00  ?  Pack years: 8.75  ?  Types: Cigarettes  ? Smokeless tobacco: Never  ?Vaping Use  ? Vaping Use: Never used  ?Substance Use Topics  ? Alcohol use: Yes  ?  Alcohol/week: 2.0 - 3.0 standard drinks  ?  Types: 2 Cans of beer  per week  ?  Comment: rare  ? Drug use: No  ? ? ?Family History  ?Problem Relation Age of Onset  ? Diabetes Mother   ? Hypertension Father   ? ? ?Allergies  ?Allergen Reactions  ? Penicillin G Anaphylaxis  ? Penicillins Anaphylaxis  ? Bupropion Other (See Comments)  ?  Mental status change.  ? Escitalopram Oxalate Other (See Comments)  ?  Delayed ejaculation.  ? ? ?Physical Exam: ?Constitutional: in no apparent distress  ?Vitals:  ? 10/14/21 0438 10/14/21 0756  ?BP: (!) 143/96 136/71  ?Pulse: 92 83  ?Resp: 20   ?Temp: (!) 102 ?F (38.9 ?C) 98.8 ?F (37.1 ?C)  ?SpO2:  97%  ? ?EYES: anicteric ?Respiratory: normal respiratory effort ?Musculoskeletal: left leg with palpable, nearly confluent erythema, edema, particularly of the foot/ankle area, warmth.   ? ?Lab Results  ?Component Value Date  ? WBC 12.0 (H) 10/14/2021  ? HGB 14.0 10/14/2021  ? HCT 40.6 10/14/2021  ? MCV 89.6 10/14/2021  ? PLT 94 (L) 10/14/2021  ?  ?Lab Results  ?Component Value Date  ? CREATININE 1.25 (H) 10/14/2021  ? BUN 14 10/14/2021  ? NA 134 (L) 10/14/2021  ? K 3.6 10/14/2021  ? CL 104 10/14/2021  ? CO2 20 (L) 10/14/2021  ?  ?Lab Results  ?Component Value  Date  ? ALT 29 07/22/2011  ? AST 13 07/22/2011  ? ALKPHOS 70 07/22/2011  ?  ? ?Microbiology: ?No results found for this or any previous visit (from the past 240 hour(s)). ? ?Gardiner Barefoot, MD ?[facility] ?[facility] ?[email or web address] ?10/14/2021, 12:11 PM  ?

## 2021-10-15 ENCOUNTER — Other Ambulatory Visit: Payer: Self-pay

## 2021-10-15 DIAGNOSIS — L03116 Cellulitis of left lower limb: Secondary | ICD-10-CM

## 2021-10-15 DIAGNOSIS — I10 Essential (primary) hypertension: Secondary | ICD-10-CM

## 2021-10-15 DIAGNOSIS — E1165 Type 2 diabetes mellitus with hyperglycemia: Secondary | ICD-10-CM

## 2021-10-15 NOTE — Patient Outreach (Signed)
Received a Hospital discharge referral from [facility] Liaison for Mr. Stem . ?I have assigned Raina Mina, RN to call for follow up and determine if there are any Case Management needs.  ?  ?Arville Care, CBCS, CMAA ?[place] Management Assistant ?[place] Management ?[phone]   ?

## 2021-10-16 ENCOUNTER — Other Ambulatory Visit
Admission: RE | Admit: 2021-10-16 | Discharge: 2021-10-16 | Disposition: A | Discharge status: Home / Self Care | Payer: 59 | Source: Home / Self Care | Attending: Infectious Diseases | Admitting: Infectious Diseases

## 2021-10-16 ENCOUNTER — Other Ambulatory Visit: Payer: Self-pay | Admitting: *Deleted

## 2021-10-16 ENCOUNTER — Encounter: Payer: Self-pay | Admitting: Infectious Diseases

## 2021-10-16 ENCOUNTER — Ambulatory Visit: Payer: 59 | Attending: Infectious Diseases | Admitting: Infectious Diseases

## 2021-10-16 DIAGNOSIS — R600 Localized edema: Secondary | ICD-10-CM | POA: Diagnosis not present

## 2021-10-16 DIAGNOSIS — L03116 Cellulitis of left lower limb: Secondary | ICD-10-CM | POA: Diagnosis not present

## 2021-10-16 DIAGNOSIS — Z888 Allergy status to other drugs, medicaments and biological substances status: Secondary | ICD-10-CM | POA: Diagnosis not present

## 2021-10-16 DIAGNOSIS — Z792 Long term (current) use of antibiotics: Secondary | ICD-10-CM | POA: Diagnosis not present

## 2021-10-16 DIAGNOSIS — K219 Gastro-esophageal reflux disease without esophagitis: Secondary | ICD-10-CM | POA: Diagnosis present

## 2021-10-16 DIAGNOSIS — G4733 Obstructive sleep apnea (adult) (pediatric): Secondary | ICD-10-CM | POA: Insufficient documentation

## 2021-10-16 DIAGNOSIS — Z79899 Other long term (current) drug therapy: Secondary | ICD-10-CM | POA: Diagnosis not present

## 2021-10-16 DIAGNOSIS — E785 Hyperlipidemia, unspecified: Secondary | ICD-10-CM | POA: Insufficient documentation

## 2021-10-16 DIAGNOSIS — Z7984 Long term (current) use of oral hypoglycemic drugs: Secondary | ICD-10-CM | POA: Diagnosis not present

## 2021-10-16 DIAGNOSIS — A46 Erysipelas: Secondary | ICD-10-CM | POA: Insufficient documentation

## 2021-10-16 DIAGNOSIS — E119 Type 2 diabetes mellitus without complications: Secondary | ICD-10-CM | POA: Diagnosis not present

## 2021-10-16 DIAGNOSIS — I891 Lymphangitis: Secondary | ICD-10-CM | POA: Diagnosis not present

## 2021-10-16 DIAGNOSIS — Z8249 Family history of ischemic heart disease and other diseases of the circulatory system: Secondary | ICD-10-CM | POA: Diagnosis not present

## 2021-10-16 DIAGNOSIS — Z880 Allergy status to penicillin: Secondary | ICD-10-CM | POA: Diagnosis not present

## 2021-10-16 DIAGNOSIS — M7989 Other specified soft tissue disorders: Secondary | ICD-10-CM | POA: Diagnosis not present

## 2021-10-16 DIAGNOSIS — R7401 Elevation of levels of liver transaminase levels: Secondary | ICD-10-CM | POA: Diagnosis not present

## 2021-10-16 DIAGNOSIS — E1129 Type 2 diabetes mellitus with other diabetic kidney complication: Secondary | ICD-10-CM | POA: Diagnosis not present

## 2021-10-16 DIAGNOSIS — Z833 Family history of diabetes mellitus: Secondary | ICD-10-CM | POA: Diagnosis not present

## 2021-10-16 DIAGNOSIS — I10 Essential (primary) hypertension: Secondary | ICD-10-CM | POA: Insufficient documentation

## 2021-10-16 DIAGNOSIS — R7989 Other specified abnormal findings of blood chemistry: Secondary | ICD-10-CM | POA: Diagnosis not present

## 2021-10-16 DIAGNOSIS — R233 Spontaneous ecchymoses: Secondary | ICD-10-CM | POA: Diagnosis not present

## 2021-10-16 DIAGNOSIS — Z6838 Body mass index (BMI) 38.0-38.9, adult: Secondary | ICD-10-CM | POA: Diagnosis not present

## 2021-10-16 DIAGNOSIS — F17210 Nicotine dependence, cigarettes, uncomplicated: Secondary | ICD-10-CM | POA: Diagnosis present

## 2021-10-16 LAB — CBC WITH DIFFERENTIAL/PLATELET
Abs Immature Granulocytes: 0.18 10*3/uL — ABNORMAL HIGH (ref 0.00–0.07)
Basophils Absolute: 0.1 10*3/uL (ref 0.0–0.1)
Basophils Relative: 1 %
Eosinophils Absolute: 0.1 10*3/uL (ref 0.0–0.5)
Eosinophils Relative: 1 %
HCT: 44.7 % (ref 39.0–52.0)
Hemoglobin: 15 g/dL (ref 13.0–17.0)
Immature Granulocytes: 2 %
Lymphocytes Relative: 15 %
Lymphs Abs: 1.6 10*3/uL (ref 0.7–4.0)
MCH: 29.9 pg (ref 26.0–34.0)
MCHC: 33.6 g/dL (ref 30.0–36.0)
MCV: 89 fL (ref 80.0–100.0)
Monocytes Absolute: 1 10*3/uL (ref 0.1–1.0)
Monocytes Relative: 10 %
Neutro Abs: 7.8 10*3/uL — ABNORMAL HIGH (ref 1.7–7.7)
Neutrophils Relative %: 71 %
Platelets: 140 10*3/uL — ABNORMAL LOW (ref 150–400)
RBC: 5.02 MIL/uL (ref 4.22–5.81)
RDW: 13.8 % (ref 11.5–15.5)
WBC: 10.8 10*3/uL — ABNORMAL HIGH (ref 4.0–10.5)
nRBC: 0 % (ref 0.0–0.2)

## 2021-10-16 LAB — BASIC METABOLIC PANEL
Anion gap: 7 (ref 5–15)
BUN: 14 mg/dL (ref 6–20)
CO2: 24 mmol/L (ref 22–32)
Calcium: 9.1 mg/dL (ref 8.9–10.3)
Chloride: 105 mmol/L (ref 98–111)
Creatinine, Ser: 0.98 mg/dL (ref 0.61–1.24)
GFR, Estimated: >60 mL/min (ref >60)
Glucose, Bld: 126 mg/dL — ABNORMAL HIGH (ref 70–99)
Potassium: 4.2 mmol/L (ref 3.5–5.1)
Sodium: 136 mmol/L (ref 135–145)

## 2021-10-16 NOTE — Progress Notes (Signed)
NAME: Jeffrey [name]  ?DOB: [birth year]  ?MRN: [id number]  ?Date/Time: 10/16/2021 11:01 AM ? ? ?Subjective:  ?Pt here with his wife for left leg erysipelas/lymphangitis cellulitis ?? ?Jeffrey [name] is a 61 y.o. with a history of HTN, OSA, DM, HLD, depression ? ?Pt is here for  cellulitis/erysipelas of the left leg. Pt was in [facility] on 10/13/21 and was Dc on 10/14/21 for fever left leg swelling and received a dose of ceftriaxone on 10/13/21 around 2pm and was discharged the next day on cefadroxil 1 gram Po q12 for 7 days after being seen by Dr.Comer- He did not like the Rx he received at [facility] he says until he was seen by ID. ?HE has h/o recurrent cellulitis in the left leg -last episode was 2017. But this is the worst one ?He had ankle surgery many years ago and the left leg has some edema since then . ?Pt has been taking round the clock tylenol and ibuprofen- last temp was yesterday 102 and overnight he felt hot and sweaty ?He says he feels okay now but has severe pain while the leg is dependent ? ? ? ?Past Medical History:  ?Diagnosis Date  ? Depression   ? Diabetes mellitus without complication (HCC)   ? GERD (gastroesophageal reflux disease)   ? Herniated disc   ? Hypertension   ? Sleep apnea uses c-pap  ? Wears glasses   ?  ?Past Surgical History:  ?Procedure Laterality Date  ? ANKLE SURGERY    ? left fx  ? APPENDECTOMY    ? CARPAL TUNNEL RELEASE Right 10/05/2014  ? Procedure: RIGHT CARPAL TUNNEL RELEASE;  Surgeon: Dominica Severin, MD;  Location: [facility];  Service: Orthopedics;  Laterality: Right;  ? COLONOSCOPY    ? LUMBAR LAMINECTOMY/DECOMPRESSION MICRODISCECTOMY  07/23/2011  ? Procedure: LUMBAR LAMINECTOMY/DECOMPRESSION MICRODISCECTOMY;  Surgeon: Jacki Cones, MD;  Location: [facility];  Service: Orthopedics;  Laterality: Right;  ? ULNAR TUNNEL RELEASE Right 10/05/2014  ? Procedure: RIGHT CUBITAL TUNNEL RELEASE;  Surgeon: Dominica Severin, MD;  Location: [facility];  Service:  Orthopedics;  Laterality: Right;  ? UMBILICAL HERNIA REPAIR N/A 10/20/2014  ? Procedure: HERNIA REPAIR UMBILICAL ADULT;  Surgeon: Renda Rolls, MD;  Location: [facility];  Service: General;  Laterality: N/A;  ? VASECTOMY    ?  ?Social History  ? ?Socioeconomic History  ? Marital status: Married  ?  Spouse name: Not on file  ? Number of children: Not on file  ? Years of education: Not on file  ? Highest education level: Not on file  ?Occupational History  ? Not on file  ?Tobacco Use  ? Smoking status: Every Day  ?  Packs/day: 0.25  ?  Years: 35.00  ?  Pack years: 8.75  ?  Types: Cigarettes  ? Smokeless tobacco: Never  ?Vaping Use  ? Vaping Use: Never used  ?Substance and Sexual Activity  ? Alcohol use: Yes  ?  Alcohol/week: 2.0 - 3.0 standard drinks  ?  Types: 2 Cans of beer per week  ?  Comment: rare  ? Drug use: No  ? Sexual activity: Never  ?Other Topics Concern  ? Not on file  ?Social History Narrative  ? Not on file  ? ?Social Determinants of Health  ? ?Financial Resource Strain: Not on file  ?Food Insecurity: Not on file  ?Transportation Needs: Not on file  ?Physical Activity: Not on file  ?Stress: Not on file  ?Social Connections: Not on  file  ?Intimate Partner Violence: Not on file  ?  ?Family History  ?Problem Relation Age of Onset  ? Diabetes Mother   ? Hypertension Father   ? ?Allergies  ?Allergen Reactions  ? Penicillin G Anaphylaxis  ? Penicillins Anaphylaxis  ? Bupropion Other (See Comments)  ?  Mental status change.  ? Escitalopram Oxalate Other (See Comments)  ?  Delayed ejaculation.  ? ?I? ?Current Outpatient Medications  ?Medication Sig Dispense Refill  ? albuterol (VENTOLIN HFA) 108 (90 Base) MCG/ACT inhaler Inhale 1-2 puffs into the lungs every 6 (six) hours as needed for wheezing or shortness of breath. 18 g 0  ? atorvastatin (LIPITOR) 10 MG tablet Take 1 tablet (10 mg total) by mouth once daily 90 tablet 1  ? canagliflozin (INVOKANA) 300 MG TABS tablet Take 1 tablet by mouth daily.    ? cefadroxil  (DURICEF) 500 MG capsule Take 2 capsules (1,000 mg total) by mouth 2 (two) times daily for 7 days. 28 capsule 0  ? lisinopril (PRINIVIL,ZESTRIL) 20 MG tablet Take 20 mg by mouth daily.    ? omeprazole (PRILOSEC) 20 MG capsule TAKE 1 CAPSULE BY MOUTH ONCE A DAY (Patient taking differently: Take 20 mg by mouth daily.) 90 capsule 3  ? sildenafil (REVATIO) 20 MG tablet TAKE 3 TABLETS BY MOUTH ONCE DAILY AS NEEDED (Patient taking differently: Take 20 mg by mouth daily as needed (Erectial dyfunction).) 60 tablet 1  ? ?No current facility-administered medications for this visit.  ?  ? ?Abtx:  ?Anti-infectives (From admission, onward)  ? ? None  ? ?  ? ? ?REVIEW OF SYSTEMS:  ?Const:  fever,  chills, negative weight loss ?Eyes: negative diplopia or visual changes, negative eye pain ?ENT: negative coryza, negative sore throat ?Resp: negative cough, hemoptysis, dyspnea ?Cards: negative for chest pain, palpitations, lower extremity edema ?GU: negative for frequency, dysuria and hematuria ?GI: Negative for abdominal pain, diarrhea, bleeding, constipation ?Skin: redness extending to the thigh ?Heme: negative for easy bruising and gum/nose bleeding ?MS: severe left leg pain ?Neurolo:negative for headaches, dizziness, vertigo, memory problems  ?Endocrine: diabetes ?Allergy/Immunology-penicillin ?Objective:  ?VITALS:  ?Temp 99.6 ?HR 89 ?PHYSICAL EXAM:  ?General: Alert, cooperative, no distress, appears stated age.  ?Lungs: Clear to auscultation bilaterally. No Wheezing or Rhonchi. No rales. ?Heart: Regular rate and rhythm, no murmur, rub or gallop. ?Abdomen:did not examine as in wheel chair ?Extremities:left leg swollen- bright red patch over the leg with edema ?Erythema over the inner thigh-  ?Worse than before ?5//4/23 ? ?10/16/21 ? ?10/16/21 ? ?10/13/21 ? ?10/13/21 ? ? ?Lymph: Cervical, supraclavicular normal. ?Neurologic: Grossly non-focal ?Pertinent Labs ?Lab Results ?CBC ?   ?Component Value Date/Time  ? WBC 12.0 (H) 10/14/2021 0600   ? RBC 4.53 10/14/2021 0600  ? HGB 14.0 10/14/2021 0600  ? HCT 40.6 10/14/2021 0600  ? PLT 94 (L) 10/14/2021 0600  ? MCV 89.6 10/14/2021 0600  ? MCH 30.9 10/14/2021 0600  ? MCHC 34.5 10/14/2021 0600  ? RDW 13.5 10/14/2021 0600  ? LYMPHSABS 2.3 07/22/2011 1010  ? MONOABS 1.0 07/22/2011 1010  ? EOSABS 0.1 07/22/2011 1010  ? BASOSABS 0.0 07/22/2011 1010  ? ? ? ?  Latest Ref Rng & Units 10/14/2021  ?  6:00 AM 10/13/2021  ? 12:16 PM 07/22/2011  ? 10:10 AM  ?CMP  ?Glucose 70 - 99 mg/dL 409106   811181   914132    ?BUN 6 - 20 mg/dL 14   14   13     ?Creatinine  0.61 - 1.24 mg/dL 8.31   5.17   6.16    ?Sodium 135 - 145 mmol/L 134   134   137    ?Potassium 3.5 - 5.1 mmol/L 3.6   4.1   4.1    ?Chloride 98 - 111 mmol/L 104   106   98    ?CO2 22 - 32 mmol/L 20   23   30     ?Calcium 8.9 - 10.3 mg/dL 8.5   9.5      ?Total Protein 6.0 - 8.3 g/dL   7.4    ?Total Bilirubin 0.3 - 1.2 mg/dL   0.3    ?Alkaline Phos 39 - 117 U/L   70    ?AST 0 - 37 U/L   13    ?ALT 0 - 53 U/L   29    ? ? ? ? ?Microbiology: ?Recent Results (from the past 240 hour(s))  ?Blood culture (routine x 2)     Status: None (Preliminary result)  ? Collection Time: 10/13/21  2:38 PM  ? Specimen: BLOOD  ?Result Value Ref Range Status  ? Specimen Description BLOOD BLOOD RIGHT HAND  Final  ? Special Requests   Final  ?  BOTTLES DRAWN AEROBIC AND ANAEROBIC Blood Culture results may not be optimal due to an inadequate volume of blood received in culture bottles  ? Culture   Final  ?  NO GROWTH 3 DAYS ?Performed at [facility], [address] [place], [place] ?  ? Report Status PENDING  Incomplete  ?Blood culture (routine x 2)     Status: None (Preliminary result)  ? Collection Time: 10/13/21  3:07 PM  ? Specimen: BLOOD  ?Result Value Ref Range Status  ? Specimen Description BLOOD RIGHT ANTECUBITAL  Final  ? Special Requests   Final  ?  BOTTLES DRAWN AEROBIC AND ANAEROBIC Blood Culture adequate volume  ? Culture   Final  ?  NO GROWTH 3 DAYS ?Performed at [facility], [address] [place], [place] ?  ? Report Status PENDING  Incomplete  ? ? ?? ?Impression/Recommendation ??Severe erysipelas, cellulits, lymphagitis of the left leg extending to the thigh ?It

## 2021-10-16 NOTE — Patient Outreach (Signed)
[facility] Customer service manager [facility]) Care Management ? ?10/16/2021 ? ?Jeffrey [name] ?08/01/1960 ?[id number] ? ? ?Transition of care call- Declined ?Referral received: 10/15/2021 ?Initial outreach: 10/16/2021 ?Insurance: [employer] Plan/ [employer] ? ?Initial telephone call to patient, 2 HIPAA identifiers verified. See transition of care flowsheet for further details. No ongoing care management needs identified. Pt verified a good support system and has sufficient transportation to all his medical appointments. Pt will follow up today with Infectious Disease provider. Reports history of this condition since 2017 on and off antibiotics. All systems stabilizing with no acute issues at this time however issues with accessing his MyChart but will call and troubleshoot and pt has the contact to resolve this issue. Working with his employer for [employer] when needed.Pt active with [facility] and has a nurse that follow ups every 90 days for ongoing management with his diabetes with his A1C around 6.2-6.7 and HTN controlled with normal readings. Pt reports all are being managed and declined [facility] case management services at this time. 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  ? ?No further needs presented at this time. Case will be closed. Pt aware on ho to contact [facility] services if needed for any services discussed today and refused information packet to be mailed. No other needs via [facility] services. ? ?Elliot Cousin, RN ?Care Management Coordinator ?[facility] Customer service manager ?Main Office [phone]  ?

## 2021-10-16 NOTE — Patient Instructions (Signed)
You are here for cellulitis/erysipelas for the left left leg- you were at [facility] on may1/2 and sent home on cefadroxil 1 gram Q12- you are also taking ibuprofen and tyelnol- avoid ibuprofen- take pictures tomorrow and upload in my chart- please keep temp log- will touch base tomorrow ?Will get CBC/BMP today ?

## 2021-10-17 ENCOUNTER — Encounter: Payer: Self-pay | Admitting: Intensive Care

## 2021-10-17 ENCOUNTER — Other Ambulatory Visit: Payer: Self-pay

## 2021-10-17 ENCOUNTER — Inpatient Hospital Stay
Admission: EM | Admit: 2021-10-17 | Discharge: 2021-10-23 | DRG: 603 | Disposition: A | Discharge status: Home / Self Care | Payer: 59 | Attending: Internal Medicine | Admitting: Internal Medicine

## 2021-10-17 ENCOUNTER — Other Ambulatory Visit (HOSPITAL_COMMUNITY): Payer: Self-pay

## 2021-10-17 ENCOUNTER — Emergency Department: Payer: 59

## 2021-10-17 DIAGNOSIS — Z7984 Long term (current) use of oral hypoglycemic drugs: Secondary | ICD-10-CM | POA: Diagnosis not present

## 2021-10-17 DIAGNOSIS — L03116 Cellulitis of left lower limb: Secondary | ICD-10-CM | POA: Diagnosis not present

## 2021-10-17 DIAGNOSIS — F17210 Nicotine dependence, cigarettes, uncomplicated: Secondary | ICD-10-CM | POA: Diagnosis present

## 2021-10-17 DIAGNOSIS — Z6838 Body mass index (BMI) 38.0-38.9, adult: Secondary | ICD-10-CM | POA: Diagnosis not present

## 2021-10-17 DIAGNOSIS — I10 Essential (primary) hypertension: Secondary | ICD-10-CM | POA: Diagnosis not present

## 2021-10-17 DIAGNOSIS — Z79899 Other long term (current) drug therapy: Secondary | ICD-10-CM | POA: Diagnosis not present

## 2021-10-17 DIAGNOSIS — Z888 Allergy status to other drugs, medicaments and biological substances status: Secondary | ICD-10-CM

## 2021-10-17 DIAGNOSIS — Z8249 Family history of ischemic heart disease and other diseases of the circulatory system: Secondary | ICD-10-CM | POA: Diagnosis not present

## 2021-10-17 DIAGNOSIS — R600 Localized edema: Secondary | ICD-10-CM | POA: Diagnosis not present

## 2021-10-17 DIAGNOSIS — R233 Spontaneous ecchymoses: Secondary | ICD-10-CM | POA: Diagnosis present

## 2021-10-17 DIAGNOSIS — E1129 Type 2 diabetes mellitus with other diabetic kidney complication: Secondary | ICD-10-CM | POA: Diagnosis present

## 2021-10-17 DIAGNOSIS — E785 Hyperlipidemia, unspecified: Secondary | ICD-10-CM | POA: Diagnosis present

## 2021-10-17 DIAGNOSIS — Z833 Family history of diabetes mellitus: Secondary | ICD-10-CM | POA: Diagnosis not present

## 2021-10-17 DIAGNOSIS — A46 Erysipelas: Secondary | ICD-10-CM | POA: Diagnosis not present

## 2021-10-17 DIAGNOSIS — E119 Type 2 diabetes mellitus without complications: Secondary | ICD-10-CM | POA: Diagnosis not present

## 2021-10-17 DIAGNOSIS — Z880 Allergy status to penicillin: Secondary | ICD-10-CM | POA: Diagnosis not present

## 2021-10-17 DIAGNOSIS — I891 Lymphangitis: Secondary | ICD-10-CM

## 2021-10-17 DIAGNOSIS — R7989 Other specified abnormal findings of blood chemistry: Secondary | ICD-10-CM | POA: Diagnosis present

## 2021-10-17 DIAGNOSIS — G4733 Obstructive sleep apnea (adult) (pediatric): Secondary | ICD-10-CM | POA: Diagnosis present

## 2021-10-17 DIAGNOSIS — K219 Gastro-esophageal reflux disease without esophagitis: Secondary | ICD-10-CM | POA: Diagnosis present

## 2021-10-17 DIAGNOSIS — R7401 Elevation of levels of liver transaminase levels: Secondary | ICD-10-CM | POA: Diagnosis present

## 2021-10-17 DIAGNOSIS — G4730 Sleep apnea, unspecified: Secondary | ICD-10-CM | POA: Diagnosis present

## 2021-10-17 DIAGNOSIS — M7989 Other specified soft tissue disorders: Secondary | ICD-10-CM | POA: Diagnosis not present

## 2021-10-17 HISTORY — DX: Unspecified streptococcus as the cause of diseases classified elsewhere: B95.5

## 2021-10-17 HISTORY — DX: Cellulitis, unspecified: L03.90

## 2021-10-17 HISTORY — DX: Hyperlipidemia, unspecified: E78.5

## 2021-10-17 LAB — COMPREHENSIVE METABOLIC PANEL
ALT: 128 U/L — ABNORMAL HIGH (ref 0–44)
AST: 83 U/L — ABNORMAL HIGH (ref 15–41)
Albumin: 3.5 g/dL (ref 3.5–5.0)
Alkaline Phosphatase: 130 U/L — ABNORMAL HIGH (ref 38–126)
Anion gap: 8 (ref 5–15)
BUN: 16 mg/dL (ref 6–20)
CO2: 24 mmol/L (ref 22–32)
Calcium: 9.2 mg/dL (ref 8.9–10.3)
Chloride: 104 mmol/L (ref 98–111)
Creatinine, Ser: 0.89 mg/dL (ref 0.61–1.24)
GFR, Estimated: >60 mL/min (ref >60)
Glucose, Bld: 137 mg/dL — ABNORMAL HIGH (ref 70–99)
Potassium: 3.8 mmol/L (ref 3.5–5.1)
Sodium: 136 mmol/L (ref 135–145)
Total Bilirubin: 1.2 mg/dL (ref 0.3–1.2)
Total Protein: 7.1 g/dL (ref 6.5–8.1)

## 2021-10-17 LAB — URINALYSIS, ROUTINE W REFLEX MICROSCOPIC
Bacteria, UA: NONE SEEN
Bilirubin Urine: NEGATIVE
Glucose, UA: >=500 mg/dL — AB
Hgb urine dipstick: NEGATIVE
Ketones, ur: 20 mg/dL — AB
Leukocytes,Ua: NEGATIVE
Nitrite: NEGATIVE
Protein, ur: NEGATIVE mg/dL
Specific Gravity, Urine: 1.036 — ABNORMAL HIGH (ref 1.005–1.030)
pH: 5 (ref 5.0–8.0)

## 2021-10-17 LAB — GLUCOSE, CAPILLARY
Glucose-Capillary: 106 mg/dL — ABNORMAL HIGH (ref 70–99)
Glucose-Capillary: 185 mg/dL — ABNORMAL HIGH (ref 70–99)

## 2021-10-17 LAB — CBC WITH DIFFERENTIAL/PLATELET
Abs Immature Granulocytes: 0.32 10*3/uL — ABNORMAL HIGH (ref 0.00–0.07)
Basophils Absolute: 0.1 10*3/uL (ref 0.0–0.1)
Basophils Relative: 0 %
Eosinophils Absolute: 0.1 10*3/uL (ref 0.0–0.5)
Eosinophils Relative: 1 %
HCT: 45.8 % (ref 39.0–52.0)
Hemoglobin: 15.4 g/dL (ref 13.0–17.0)
Immature Granulocytes: 3 %
Lymphocytes Relative: 19 %
Lymphs Abs: 2.2 10*3/uL (ref 0.7–4.0)
MCH: 30.6 pg (ref 26.0–34.0)
MCHC: 33.6 g/dL (ref 30.0–36.0)
MCV: 91.1 fL (ref 80.0–100.0)
Monocytes Absolute: 1.2 10*3/uL — ABNORMAL HIGH (ref 0.1–1.0)
Monocytes Relative: 11 %
Neutro Abs: 7.3 10*3/uL (ref 1.7–7.7)
Neutrophils Relative %: 66 %
Platelets: 167 10*3/uL (ref 150–400)
RBC: 5.03 MIL/uL (ref 4.22–5.81)
RDW: 13.8 % (ref 11.5–15.5)
WBC: 11.2 10*3/uL — ABNORMAL HIGH (ref 4.0–10.5)
nRBC: 0 % (ref 0.0–0.2)

## 2021-10-17 LAB — C-REACTIVE PROTEIN: CRP: 17.2 mg/dL — ABNORMAL HIGH (ref <1.0)

## 2021-10-17 LAB — LACTIC ACID, PLASMA
Lactic Acid, Venous: 1.1 mmol/L (ref 0.5–1.9)
Lactic Acid, Venous: 1.5 mmol/L (ref 0.5–1.9)

## 2021-10-17 LAB — HEPATITIS PANEL, ACUTE
HCV Ab: NONREACTIVE
Hep A IgM: NONREACTIVE
Hep B C IgM: NONREACTIVE
Hepatitis B Surface Ag: NONREACTIVE

## 2021-10-17 LAB — SEDIMENTATION RATE: Sed Rate: 35 mm/hr — ABNORMAL HIGH (ref 0–20)

## 2021-10-17 MED ORDER — SODIUM CHLORIDE 0.9 % IV BOLUS
1000.0000 mL | Freq: Once | INTRAVENOUS | Status: AC
Start: 2021-10-17 — End: 2021-10-17
  Administered 2021-10-17: 1000 mL via INTRAVENOUS

## 2021-10-17 MED ORDER — LISINOPRIL 20 MG PO TABS
20.0000 mg | ORAL_TABLET | Freq: Every day | ORAL | Status: DC
  Administered 2021-10-18 – 2021-10-23 (×6): 20 mg via ORAL
  Filled 2021-10-17 (×6): qty 1

## 2021-10-17 MED ORDER — HYDRALAZINE HCL 20 MG/ML IJ SOLN: 5.0000 mg | INTRAMUSCULAR | Status: DC | PRN

## 2021-10-17 MED ORDER — ATORVASTATIN CALCIUM 10 MG PO TABS
10.0000 mg | ORAL_TABLET | Freq: Every day | ORAL | Status: DC
  Administered 2021-10-18 – 2021-10-23 (×6): 10 mg via ORAL
  Filled 2021-10-17 (×6): qty 1

## 2021-10-17 MED ORDER — CEFAZOLIN SODIUM-DEXTROSE 2-4 GM/100ML-% IV SOLN
2.0000 g | Freq: Three times a day (TID) | INTRAVENOUS | Status: DC
  Administered 2021-10-17 – 2021-10-23 (×17): 2 g via INTRAVENOUS
  Filled 2021-10-17 (×21): qty 100

## 2021-10-17 MED ORDER — ONDANSETRON HCL 4 MG/2ML IJ SOLN
4.0000 mg | Freq: Three times a day (TID) | INTRAMUSCULAR | Status: DC | PRN
Start: 2021-10-17 — End: 2021-10-23

## 2021-10-17 MED ORDER — ALBUTEROL SULFATE (2.5 MG/3ML) 0.083% IN NEBU: 3.0000 mL | INHALATION_SOLUTION | RESPIRATORY_TRACT | Status: DC | PRN

## 2021-10-17 MED ORDER — CEFAZOLIN SODIUM-DEXTROSE 2-4 GM/100ML-% IV SOLN
2.0000 g | Freq: Once | INTRAVENOUS | Status: AC
  Administered 2021-10-17: 2 g via INTRAVENOUS
  Filled 2021-10-17: qty 100

## 2021-10-17 MED ORDER — CLINDAMYCIN PHOSPHATE 900 MG/50ML IV SOLN
900.0000 mg | Freq: Three times a day (TID) | INTRAVENOUS | Status: AC
  Administered 2021-10-17 – 2021-10-21 (×13): 900 mg via INTRAVENOUS
  Filled 2021-10-17 (×14): qty 50

## 2021-10-17 MED ORDER — NICOTINE 21 MG/24HR TD PT24
21.0000 mg | MEDICATED_PATCH | Freq: Every day | TRANSDERMAL | Status: DC
  Filled 2021-10-17 (×3): qty 1

## 2021-10-17 MED ORDER — OXYCODONE HCL 5 MG PO TABS
5.0000 mg | ORAL_TABLET | ORAL | Status: DC | PRN
  Administered 2021-10-17 – 2021-10-22 (×10): 5 mg via ORAL
  Filled 2021-10-17 (×10): qty 1

## 2021-10-17 MED ORDER — ACETAMINOPHEN 325 MG PO TABS
650.0000 mg | ORAL_TABLET | Freq: Four times a day (QID) | ORAL | Status: DC | PRN
Start: 2021-10-17 — End: 2021-10-23
  Administered 2021-10-17 – 2021-10-18 (×2): 650 mg via ORAL
  Filled 2021-10-17 (×2): qty 2

## 2021-10-17 MED ORDER — PANTOPRAZOLE SODIUM 40 MG PO TBEC
40.0000 mg | DELAYED_RELEASE_TABLET | Freq: Every day | ORAL | Status: DC
Start: 2021-10-18 — End: 2021-10-23
  Administered 2021-10-18 – 2021-10-23 (×6): 40 mg via ORAL
  Filled 2021-10-17 (×6): qty 1

## 2021-10-17 MED ORDER — IBUPROFEN 400 MG PO TABS
200.0000 mg | ORAL_TABLET | Freq: Four times a day (QID) | ORAL | Status: DC | PRN
  Administered 2021-10-19: 200 mg via ORAL
  Filled 2021-10-17: qty 1

## 2021-10-17 MED ORDER — INSULIN ASPART 100 UNIT/ML IJ SOLN
0.0000 [IU] | Freq: Three times a day (TID) | INTRAMUSCULAR | Status: DC
  Administered 2021-10-18: 1 [IU] via SUBCUTANEOUS
  Administered 2021-10-18 (×2): 2 [IU] via SUBCUTANEOUS
  Administered 2021-10-19: 3 [IU] via SUBCUTANEOUS
  Administered 2021-10-19 – 2021-10-23 (×12): 2 [IU] via SUBCUTANEOUS
  Filled 2021-10-17 (×16): qty 1

## 2021-10-17 MED ORDER — IBUPROFEN 400 MG PO TABS
200.0000 mg | ORAL_TABLET | Freq: Four times a day (QID) | ORAL | Status: DC | PRN
Start: 2021-10-17 — End: 2021-10-17

## 2021-10-17 MED ORDER — INSULIN ASPART 100 UNIT/ML IJ SOLN: 0.0000 [IU] | Freq: Every day | INTRAMUSCULAR | Status: DC

## 2021-10-17 MED ORDER — MORPHINE SULFATE (PF) 2 MG/ML IV SOLN: 2.0000 mg | INTRAVENOUS | Status: DC | PRN

## 2021-10-17 MED ORDER — ENOXAPARIN SODIUM 60 MG/0.6ML IJ SOSY
0.5000 mg/kg | PREFILLED_SYRINGE | INTRAMUSCULAR | Status: DC
  Administered 2021-10-17 – 2021-10-22 (×6): 60 mg via SUBCUTANEOUS
  Filled 2021-10-17 (×6): qty 0.6

## 2021-10-17 NOTE — Assessment & Plan Note (Addendum)
-   Continue IV hydralazine as needed ?-Lisinopril ?

## 2021-10-17 NOTE — Assessment & Plan Note (Addendum)
Cellulitis/erysipelas/ lymphagitis per Dr. Rivka Safer of ID. Sepsis ruled out. ? ?Lower extremity Doppler has ruled out DVT.  Patient responding well to IV cefazolin and clindamycin.  ID considering to stop clindamycin and possible discharge on oral antibiotic in next 1 to 2 days ?

## 2021-10-17 NOTE — ED Provider Notes (Signed)
? ?[facility] ?Provider Note ? ? ? Event Date/Time  ? First MD Initiated Contact with Patient 10/17/21 1330   ?  (approximate) ? ? ?History  ? ?Chief Complaint ?Cellulitis ? ? ?HPI ? ?Baran Klemz is a 61 y.o. male with past medical history of hypertension and diabetes who presents to the ED complaining of left leg swelling and pain.  Patient reports that he first developed pain and swelling in the lower part of his left leg about 5 days ago, was seen in the [facility], ED at that time and admitted for cellulitis which was treated with IV Rocephin.  Patient reports that he had initial improvement in the redness and was discharged home on cefadroxil.  He has been taking this consistently and followed up with infectious disease yesterday, at which time he noted increasing redness and swelling that was tracking up his leg.  He additionally has started to notice redness along his left medial thigh in a lymphatic distribution.  He was referred to the ED for further evaluation along with IV antibiotics by infectious disease.  Patient states he has been having fevers as high as 102, including earlier today.  He has not had any Tylenol or ibuprofen today.  He has dealt with recurrent infections in this leg previously, denies any recent trauma. ?  ? ? ?Physical Exam  ? ?Triage Vital Signs: ?ED Triage Vitals  ?Enc Vitals Group  ?   BP 10/17/21 1114 (!) 135/114  ?   Pulse Rate 10/17/21 1114 84  ?   Resp 10/17/21 1114 18  ?   Temp 10/17/21 1114 98.5 ?F (36.9 ?C)  ?   Temp Source 10/17/21 1114 Oral  ?   SpO2 10/17/21 1114 97 %  ?   Weight 10/17/21 1117 267 lb (121.1 kg)  ?   Height 10/17/21 1117 5\' 10"  (1.778 m)  ?   Head Circumference --   ?   Peak Flow --   ?   Pain Score 10/17/21 1116 7  ?   Pain Loc --   ?   Pain Edu? --   ?   Excl. in [place]? --   ? ? ?Most recent vital signs: ?Vitals:  ? 10/17/21 1114 10/17/21 1441  ?BP: (!) 135/114 136/78  ?Pulse: 84 78  ?Resp: 18 18  ?Temp: 98.5 ?F (36.9 ?C)    ?SpO2: 97% 98%  ? ? ?Constitutional: Alert and oriented. ?Eyes: Conjunctivae are normal. ?Head: Atraumatic. ?Nose: No congestion/rhinnorhea. ?Mouth/Throat: Mucous membranes are moist.  ?Cardiovascular: Normal rate, regular rhythm. Grossly normal heart sounds.  2+ radial pulses bilaterally. ?Respiratory: Normal respiratory effort.  No retractions. Lungs CTAB. ?Gastrointestinal: Soft and nontender. No distention. ?Musculoskeletal: Firm edema, erythema, warmth, and tenderness circumferentially around left lower leg extending into foot.  No focal fluctuance or purulent drainage noted.  Erythema tracking up the medial portion of the upper leg towards groin.  No subcutaneous gas noted. ?Neurologic:  Normal speech and language. No gross focal neurologic deficits are appreciated. ? ? ? ?ED Results / Procedures / Treatments  ? ?Labs ?(all labs ordered are listed, but only abnormal results are displayed) ?Labs Reviewed  ?COMPREHENSIVE METABOLIC PANEL - Abnormal; Notable for the following components:  ?    Result Value  ? Glucose, Bld 137 (*)   ? AST 83 (*)   ? ALT 128 (*)   ? Alkaline Phosphatase 130 (*)   ? All other components within normal limits  ?CBC WITH DIFFERENTIAL/PLATELET -  Abnormal; Notable for the following components:  ? WBC 11.2 (*)   ? Monocytes Absolute 1.2 (*)   ? Abs Immature Granulocytes 0.32 (*)   ? All other components within normal limits  ?URINALYSIS, ROUTINE W REFLEX MICROSCOPIC - Abnormal; Notable for the following components:  ? Color, Urine YELLOW (*)   ? APPearance CLEAR (*)   ? Specific Gravity, Urine 1.036 (*)   ? Glucose, UA >=500 (*)   ? Ketones, ur 20 (*)   ? All other components within normal limits  ?CULTURE, BLOOD (ROUTINE X 2)  ?CULTURE, BLOOD (ROUTINE X 2)  ?LACTIC ACID, PLASMA  ?LACTIC ACID, PLASMA  ?SEDIMENTATION RATE  ?C-REACTIVE PROTEIN  ?HEPATITIS PANEL, ACUTE  ? ? ?RADIOLOGY ?Left tib-fib x-ray reviewed by me with no fracture, dislocation, or subcutaneous  gas. ? ?PROCEDURES: ? ?Critical Care performed: No ? ?Procedures ? ? ?MEDICATIONS ORDERED IN ED: ?Medications  ?clindamycin (CLEOCIN) IVPB 900 mg (900 mg Intravenous New Bag/Given 10/17/21 1431)  ?sodium chloride 0.9 % bolus 1,000 mL (has no administration in time range)  ?acetaminophen (TYLENOL) tablet 650 mg (has no administration in time range)  ?nicotine (NICODERM CQ - dosed in mg/24 hours) patch 21 mg (has no administration in time range)  ?ibuprofen (ADVIL) tablet 200 mg (has no administration in time range)  ?ondansetron (ZOFRAN) injection 4 mg (has no administration in time range)  ?hydrALAZINE (APRESOLINE) injection 5 mg (has no administration in time range)  ?insulin aspart (novoLOG) injection 0-9 Units (has no administration in time range)  ?insulin aspart (novoLOG) injection 0-5 Units (has no administration in time range)  ?albuterol (VENTOLIN HFA) 108 (90 Base) MCG/ACT inhaler 2 puff (has no administration in time range)  ?enoxaparin (LOVENOX) injection 40 mg (has no administration in time range)  ?ceFAZolin (ANCEF) IVPB 2g/100 mL premix (0 g Intravenous Stopped 10/17/21 1432)  ? ? ? ?IMPRESSION / MDM / ASSESSMENT AND PLAN / ED COURSE  ?I reviewed the triage vital signs and the nursing notes. ?             ?               ? ?61 y.o. male with past medical history of hypertension and diabetes who presents to the ED with increasing left lower extremity pain and swelling over the past 5 days following recent admission for IV Rocephin. ? ?Differential diagnosis includes, but is not limited to, cellulitis, erysipelas, necrotizing fasciitis, abscess, sepsis. ? ?Patient well-appearing and in no acute distress, vital signs are reassuring and not consistent with sepsis.  He does have signs of worsening left lower extremity infection that has now failed treatment with IV Rocephin and cefadroxil.  No subcutaneous gas noted on exam or x-ray and no findings to suggest necrotizing fasciitis.  We will start patient on IV  Ancef and clindamycin at the recommendation of infectious disease.  Labs are reassuring with mild leukocytosis, BMP without electrolyte abnormality or AKI, patient does have mild transaminitis.  Case discussed with hospitalist for admission. ? ?  ? ? ?FINAL CLINICAL IMPRESSION(S) / ED DIAGNOSES  ? ?Final diagnoses:  ?Left leg cellulitis  ? ? ? ?Rx / DC Orders  ? ?ED Discharge Orders   ? ? None  ? ?  ? ? ? ?Note:  This document was prepared using Dragon voice recognition software and may include unintentional dictation errors. ?  ?Blake Divine, MD ?10/17/21 1509 ? ?

## 2021-10-17 NOTE — Progress Notes (Signed)
PHARMACIST - PHYSICIAN COMMUNICATION ? ?CONCERNING:  Enoxaparin (Lovenox) for DVT Prophylaxis  ? ? ?RECOMMENDATION: ?Patient was prescribed enoxaprin 40mg  q24 hours for VTE prophylaxis.  ? Weights  ? 10/17/21 1117  ?Weight: 121.1 kg (267 lb)  ? ? ?Body mass index is 38.31 kg/m?. ? ?Estimated Creatinine Clearance: 115.1 mL/min (by C-G formula based on SCr of 0.89 mg/dL). ? ? ?Based on [facility] policy patient is candidate for enoxaparin 0.5mg /kg TBW SQ every 24 hours based on BMI being >30. ? ?Patient is candidate for enoxaparin 30mg  every 24 hours based on CrCl <37ml/min or Weight <45kg ? ?DESCRIPTION: ?Pharmacy has adjusted enoxaparin dose per [facility] policy. ? ?Patient is now receiving enoxaparin 60 mg every 24 hours  ? ? ?31m, PharmD ?Clinical Pharmacist  ?10/17/2021 ?3:12 PM ? ?

## 2021-10-17 NOTE — ED Notes (Signed)
PT resting in stretcher with reddened and swollen LLE. Pt asking for IV abx at this time based on their meeting with ID MD yesterday. I informed the patient that once our provider reviews patient chart and is in to assess patient need for medication will be completed. Pt asking why he needs to see the doctor if he already spoke with his ID MD. Pt informed that provider will be in soon to assess patient shortly.  ?

## 2021-10-17 NOTE — Assessment & Plan Note (Addendum)
Unclear etiology although appears to be improving.  Possibly from shock liver from hypotension.  Repeat labs shows improvement ?

## 2021-10-17 NOTE — Assessment & Plan Note (Addendum)
Recent A1c 6.6, well controlled.  Patient taking Invokana at home ?-Continue sliding scale insulin while in the hospital. ?

## 2021-10-17 NOTE — TOC Benefit Eligibility Note (Signed)
Patient Advocate Encounter ? ?Insurance verification completed.   ? ?The patient is currently admitted and upon discharge could be taking linezolid (Zyvox) 600 mg tablets. ? ?The current 14 day co-pay is, $5.00.  ? ?The patient is insured through [employer]  ? ? ? ?Lyndel Safe, CPhT ?Pharmacy Patient Advocate Specialist ?[facility] Patient Advocate Team ?Direct Number: [phone]  Fax: [phone] ? ? ? ? ? ?  ?

## 2021-10-17 NOTE — Assessment & Plan Note (Addendum)
-  Continue Lipitor °

## 2021-10-17 NOTE — H&P (Signed)
?History and Physical  ? ? ?Jeffrey [name] GLO:[id number] DOB: 1961/03/26 DOA: 10/17/2021 ? ?Referring MD/NP/PA:  ? ?PCP: Kandyce Rud, MD  ? ?Patient coming from:  The patient is coming from home.  At baseline, pt is independent for most of ADL.       ? ?Chief Complaint: Left leg pain, erythema and swelling ? ?HPI: Jeffrey [name] is a 61 y.o. male with medical history significant of recurrent strep cellulitis, hypertension, hyperlipidemia, diabetes mellitus, GERD, depression, OSA on CPAP, who presents with left leg pain, erythema and swelling. ? ?Patient states that he has recurrent streptococcal cellulitis 4 times in the past.  He developed left lower leg pain, erythema and swelling for more than 5 days.  The pain is constant, sharp, nonradiating.  Patient has chills and intermittent fever, had fever of 102 at home.  Denies chest pain, cough, shortness breath.  No nausea, vomiting, diarrhea or abdominal pain.  No symptoms of UTI.  Patient was started on Duricef on Tuesday without improvement.   ? ?Data Reviewed and ED Course: pt was found to have WBC 11.2, lactic acid 1.1, GFR> 60, abnormal liver function (ALP 130, AST 83, ALT 128, total bilirubin 1.2), temperature normal, blood pressure 136/78, HR 84, RR 18, oxygen saturation 97% on room air.  X-ray of left tibia is negative for bony fracture and no gas.  Patient is admitted to MedSurg bed as inpatient.  Dr. Rivka Safer of infectious diseases consulted. ? ? ?EKG: I have personally reviewed.  Sinus rhythm, QTc 429, T wave inversion in lead III/aVF ? ? ?Review of Systems:  ? ?General: has fevers, chills, no body weight gain, has fatigue ?HEENT: no blurry vision, hearing changes or sore throat ?Respiratory: no dyspnea, coughing, wheezing ?CV: no chest pain, no palpitations ?GI: no nausea, vomiting, abdominal pain, diarrhea, constipation ?GU: no dysuria, burning on urination, increased urinary frequency, hematuria  ?Ext: has left lower leg pain, swelling  and erythema ?Neuro: no unilateral weakness, numbness, or tingling, no vision change or hearing loss ?Skin: no rash, no skin tear. ?MSK: No muscle spasm, no deformity, no limitation of range of movement in spin ?Heme: No easy bruising.  ?Travel history: No recent long distant travel. ? ? ?Allergy:  ?Allergies  ?Allergen Reactions  ? Penicillin G Anaphylaxis  ? Penicillins Anaphylaxis  ? Bupropion Other (See Comments)  ?  Mental status change.  ? Escitalopram Oxalate Other (See Comments)  ?  Delayed ejaculation.  ? ? ?Past Medical History:  ?Diagnosis Date  ? Depression   ? Diabetes mellitus without complication (HCC)   ? GERD (gastroesophageal reflux disease)   ? Herniated disc   ? HLD (hyperlipidemia)   ? Hypertension   ? Sleep apnea uses c-pap  ? Streptococcal cellulitis   ? Wears glasses   ? ? ?Past Surgical History:  ?Procedure Laterality Date  ? ANKLE SURGERY    ? left fx  ? APPENDECTOMY    ? CARPAL TUNNEL RELEASE Right 10/05/2014  ? Procedure: RIGHT CARPAL TUNNEL RELEASE;  Surgeon: Dominica Severin, MD;  Location: [facility];  Service: Orthopedics;  Laterality: Right;  ? COLONOSCOPY    ? LUMBAR LAMINECTOMY/DECOMPRESSION MICRODISCECTOMY  07/23/2011  ? Procedure: LUMBAR LAMINECTOMY/DECOMPRESSION MICRODISCECTOMY;  Surgeon: Jacki Cones, MD;  Location: [facility];  Service: Orthopedics;  Laterality: Right;  ? ULNAR TUNNEL RELEASE Right 10/05/2014  ? Procedure: RIGHT CUBITAL TUNNEL RELEASE;  Surgeon: Dominica Severin, MD;  Location: [facility];  Service: Orthopedics;  Laterality: Right;  ?  UMBILICAL HERNIA REPAIR N/A 10/20/2014  ? Procedure: HERNIA REPAIR UMBILICAL ADULT;  Surgeon: Renda Rolls, MD;  Location: [facility];  Service: General;  Laterality: N/A;  ? VASECTOMY    ? ? ?Social History:  reports that he has been smoking cigarettes. He has a 8.75 pack-year smoking history. He has never used smokeless tobacco. He reports that he does not currently use alcohol after a past usage of about 2.0  - 3.0 standard drinks per week. He reports that he does not use drugs. ? ?Family History:  ?Family History  ?Problem Relation Age of Onset  ? Diabetes Mother   ? Hypertension Father   ?  ? ?Prior to Admission medications   ?Medication Sig Start Date End Date Taking? Authorizing Provider  ?albuterol (VENTOLIN HFA) 108 (90 Base) MCG/ACT inhaler Inhale 1-2 puffs into the lungs every 6 (six) hours as needed for wheezing or shortness of breath. 04/28/21   Mickie Bail, NP  ?atorvastatin (LIPITOR) 10 MG tablet Take 1 tablet (10 mg total) by mouth once daily 09/16/20     ?canagliflozin (INVOKANA) 300 MG TABS tablet Take 1 tablet by mouth daily. 08/30/15   [provider]  ?cefadroxil (DURICEF) 500 MG capsule Take 2 capsules (1,000 mg total) by mouth 2 (two) times daily for 7 days. 10/14/21 10/21/21  Tyrone Nine, MD  ?lisinopril (PRINIVIL,ZESTRIL) 20 MG tablet Take 20 mg by mouth daily.    [provider]  ?omeprazole (PRILOSEC) 20 MG capsule TAKE 1 CAPSULE BY MOUTH ONCE A DAY ?Patient taking differently: Take 20 mg by mouth daily. 11/02/16   Fisher, Roselyn Bering, PA-C  ?sildenafil (REVATIO) 20 MG tablet TAKE 3 TABLETS BY MOUTH ONCE DAILY AS NEEDED ?Patient taking differently: Take 20 mg by mouth daily as needed (Erectial dyfunction). 06/11/21     ? ? ?Physical Exam: ?Vitals:  ? 10/17/21 1600 10/17/21 1604 10/17/21 1615 10/17/21 1639  ?BP: 129/79  129/79 135/77  ?Pulse: 84  87 86  ?Resp:  17 18   ?Temp:   98.4 ?F (36.9 ?C) 99.1 ?F (37.3 ?C)  ?TempSrc:    Oral  ?SpO2:  98% 99% 99%  ?Weight:      ?Height:      ? ?General: Not in acute distress ?HEENT: ?      Eyes: PERRL, EOMI, no scleral icterus. ?      ENT: No discharge from the ears and nose, no pharynx injection, no tonsillar enlargement.  ?      Neck: No JVD, no bruit, no mass felt. ?Heme: No neck lymph node enlargement. ?Cardiac: S1/S2, RRR, No murmurs, No gallops or rubs. ?Respiratory: No rales, wheezing, rhonchi or rubs. ?GI: Soft, nondistended, nontender, no  rebound pain, no organomegaly, BS present. ?GU: No hematuria ?Ext:  has left lower leg tenderness, warmth, swelling and erythema. ? ? ? ? ?Musculoskeletal: No joint deformities, No joint redness or warmth, no limitation of ROM in spin. ?Skin: No rashes.  ?Neuro: Alert, oriented X3, cranial nerves II-XII grossly intact, moves all extremities normally.  ?Psych: Patient is not psychotic, no suicidal or hemocidal ideation. ? ?Labs on Admission: I have personally reviewed following labs and imaging studies ? ?CBC: ?Recent Labs  ?Lab 10/13/21 ?1216 10/14/21 ?0600 10/16/21 ?1232 10/17/21 ?1118  ?WBC 14.7* 12.0* 10.8* 11.2*  ?NEUTROABS  --   --  7.8* 7.3  ?HGB 15.3 14.0 15.0 15.4  ?HCT 44.3 40.6 44.7 45.8  ?MCV 91.0 89.6 89.0 91.1  ?PLT 112* 94* 140* 167  ? ?  Basic Metabolic Panel: ?Recent Labs  ?Lab 10/13/21 ?1216 10/14/21 ?0600 10/16/21 ?1232 10/17/21 ?1118  ?NA 134* 134* 136 136  ?K 4.1 3.6 4.2 3.8  ?CL 106 104 105 104  ?CO2 23 20* 24 24  ?GLUCOSE 181* 106* 126* 137*  ?BUN 14 14 14 16   ?CREATININE 1.29* 1.25* 0.98 0.89  ?CALCIUM 9.5 8.5* 9.1 9.2  ? ?GFR: ?Estimated Creatinine Clearance: 115.1 mL/min (by C-G formula based on SCr of 0.89 mg/dL). ?Liver Function Tests: ?Recent Labs  ?Lab 10/17/21 ?1118  ?AST 83*  ?ALT 128*  ?ALKPHOS 130*  ?BILITOT 1.2  ?PROT 7.1  ?ALBUMIN 3.5  ? ?No results for input(s): LIPASE, AMYLASE in the last 168 hours. ?No results for input(s): AMMONIA in the last 168 hours. ?Coagulation Profile: ?No results for input(s): INR, PROTIME in the last 168 hours. ?Cardiac Enzymes: ?No results for input(s): CKTOTAL, CKMB, CKMBINDEX, TROPONINI in the last 168 hours. ?BNP (last 3 results) ?No results for input(s): PROBNP in the last 8760 hours. ?HbA1C: ?No results for input(s): HGBA1C in the last 72 hours. ?CBG: ?Recent Labs  ?Lab 10/14/21 ?0901 10/14/21 ?1154 10/17/21 ?1637  ?GLUCAP 162* 123* 106*  ? ?Lipid Profile: ?No results for input(s): CHOL, HDL, LDLCALC, TRIG, CHOLHDL, LDLDIRECT in the last 72  hours. ?Thyroid Function Tests: ?No results for input(s): TSH, T4TOTAL, FREET4, T3FREE, THYROIDAB in the last 72 hours. ?Anemia Panel: ?No results for input(s): VITAMINB12, FOLATE, FERRITIN, TIBC, IRON, RETIC

## 2021-10-17 NOTE — ED Triage Notes (Signed)
Patient presents with redness and swelling to his left lower leg. Reports he has been diagnosed with strep cellulitis and sent here for IV antibiotics ?

## 2021-10-17 NOTE — ED Notes (Signed)
EKG to EDP Robinson in person.  

## 2021-10-17 NOTE — Consult Note (Signed)
Pharmacy Antibiotic Note ? ?Jeffrey [name] is a 61 y.o. male w/ h/o recurrent strep cellulitis, HTN, HLD, DM, GERD, MDD, OSA on CPAP, who presents with left leg pain, erythema and swelling admitted on 10/17/2021 for treatment of cellulitis.  Pharmacy has been consulted for Ancef dosing. ? ?Plan: ?Ancef 2g IV x1 (5/05 1419); followed by Ancef 2g IV q8h ? ?Height: 5\' 10"  (177.8 cm) ?Weight: 121.1 kg (267 lb) ?IBW/kg (Calculated) : 73 ? ?Temp (24hrs), Avg:98.7 ?F (37.1 ?C), Min:98.4 ?F (36.9 ?C), Max:99.1 ?F (37.3 ?C) ? ?Recent Labs  ?Lab 10/13/21 ?1216 10/13/21 ?1507 10/13/21 ?2025 10/14/21 ?0600 10/16/21 ?1232 10/17/21 ?1118 10/17/21 ?1119 10/17/21 ?1439  ?WBC 14.7*  --   --  12.0* 10.8* 11.2*  --   --   ?CREATININE 1.29*  --   --  1.25* 0.98 0.89  --   --   ?LATICACIDVEN  --  1.5 1.2  --   --   --  1.1 1.5  ?  ?Estimated Creatinine Clearance: 115.1 mL/min (by C-G formula based on SCr of 0.89 mg/dL).   ? ?Allergies  ?Allergen Reactions  ? Penicillin G Anaphylaxis  ? Penicillins Anaphylaxis  ? Bupropion Other (See Comments)  ?  Mental status change.  ? Escitalopram Oxalate Other (See Comments)  ?  Delayed ejaculation.  ? ? ?Antimicrobials this admission: ?Ancef/Clinda (5/05 >>  ? ?Dose adjustments this admission: ?CTM and adjust PRN ? ?Microbiology results: ?5/05 BCx: sent ? ?Thank you for allowing pharmacy to be a part of this patient?s care. ? ?Shanon Brow Moyinoluwa Dawe ?10/17/2021 5:48 PM ? ?

## 2021-10-17 NOTE — Assessment & Plan Note (Addendum)
Continue CPAP.  

## 2021-10-17 NOTE — Consult Note (Addendum)
NAME: Jeffrey [name]  ?DOB: 1961/05/09  ?MRN: [id number]  ?Date/Time: 10/17/2021 5:43 PM ? ?REQUESTING PROVIDER: Charna Archer ?Subjective:  ?REASON FOR CONSULT: cellulitis left leg ?Pt seen by me yesterday in my office- I asked him to go to the ED last evening because of worsening cellulitis left leg and fever- HE did not go- this morning he went to his PCP to do FMLA paper work and he sent him to the ED ?? ?Jeffrey [name] is a 61 y.o. with a history of HTN, OSA, DM, HLD, depression is admitted to the hospital with worsening left leg cellulitis. ?Patient was Jeffrey [name] on 10/13/2021 with worsening pain, swelling and fever .  He received a dose of ceftriaxone in the ED the next day he was discharged on cefadroxil 1 g p.o. every 12 for 7 days after is being seen by infectious disease.  Patient apparently did not want to stay in the hospital because of poor service and he was asked to follow-up as outpatient with ID.  He came to see me yesterday.  Patient has had fevers since his discharge Tmax of 102.  He has been taking high-dose ibuprofen round-the-clock along with Tylenol.  The leg was looking very angry and also had some lymphangitis ?I had sent WBC and told him that if it was high to biotic to linezolid.  Would change his ?I called him last evening to say that the white count had come down to 10.8 and that time his wife and him mentioned that he had a temperature of 102 and felt like he was run over by a bus. I asked him to go to the emergency department to get IV antibiotics as the other option of p.o. linezolid was not possible because the pharmacy was closed.  I had called  ED physician and informed him as well. ?Patient has had cellulitis in that leg in the past.  The last episode was 2017.  He has had ankle surgery many years ago and since then he has had intermittent cellulitis.  This episode was precipitated by an injury to the left leg below the knee where a scab has been formed now. ?Past Medical  History:  ?Diagnosis Date  ? Depression   ? Diabetes mellitus without complication ([place])   ? GERD (gastroesophageal reflux disease)   ? Herniated disc   ? HLD (hyperlipidemia)   ? Hypertension   ? Sleep apnea uses c-pap  ? Streptococcal cellulitis   ? Wears glasses   ?  ?Past Surgical History:  ?Procedure Laterality Date  ? ANKLE SURGERY    ? left fx  ? APPENDECTOMY    ? CARPAL TUNNEL RELEASE Right 10/05/2014  ? Procedure: RIGHT CARPAL TUNNEL RELEASE;  Surgeon: Roseanne Kaufman, MD;  Location: [place];  Service: Orthopedics;  Laterality: Right;  ? COLONOSCOPY    ? LUMBAR LAMINECTOMY/DECOMPRESSION MICRODISCECTOMY  07/23/2011  ? Procedure: LUMBAR LAMINECTOMY/DECOMPRESSION MICRODISCECTOMY;  Surgeon: Tobi Bastos, MD;  Location: [facility];  Service: Orthopedics;  Laterality: Right;  ? ULNAR TUNNEL RELEASE Right 10/05/2014  ? Procedure: RIGHT CUBITAL TUNNEL RELEASE;  Surgeon: Roseanne Kaufman, MD;  Location: [place];  Service: Orthopedics;  Laterality: Right;  ? UMBILICAL HERNIA REPAIR N/A 10/20/2014  ? Procedure: HERNIA REPAIR UMBILICAL ADULT;  Surgeon: Rochel Brome, MD;  Location: [facility];  Service: General;  Laterality: N/A;  ? VASECTOMY    ?  ?Social History  ? ?Socioeconomic History  ? Marital status: Married  ?  Spouse  name: Not on file  ? Number of children: Not on file  ? Years of education: Not on file  ? Highest education level: Not on file  ?Occupational History  ? Not on file  ?Tobacco Use  ? Smoking status: Every Day  ?  Packs/day: 0.25  ?  Years: 35.00  ?  Pack years: 8.75  ?  Types: Cigarettes  ? Smokeless tobacco: Never  ?Vaping Use  ? Vaping Use: Never used  ?Substance and Sexual Activity  ? Alcohol use: Not Currently  ?  Alcohol/week: 2.0 - 3.0 standard drinks  ?  Types: 2 Cans of beer per week  ?  Comment: rare  ? Drug use: No  ? Sexual activity: Never  ?Other Topics Concern  ? Not on file  ?Social History Narrative  ? Not on file  ? ?Social Determinants of Health   ? ?Financial Resource Strain: Not on file  ?Food Insecurity: Not on file  ?Transportation Needs: Not on file  ?Physical Activity: Not on file  ?Stress: Not on file  ?Social Connections: Not on file  ?Intimate Partner Violence: Not on file  ?  ?Family History  ?Problem Relation Age of Onset  ? Diabetes Mother   ? Hypertension Father   ? ?Allergies  ?Allergen Reactions  ? Penicillin G Anaphylaxis  ? Penicillins Anaphylaxis  ? Bupropion Other (See Comments)  ?  Mental status change.  ? Escitalopram Oxalate Other (See Comments)  ?  Delayed ejaculation.  ? ?I? ?Current Facility-Administered Medications  ?Medication Dose Route Frequency Provider Last Rate Last Admin  ? acetaminophen (TYLENOL) tablet 650 mg  650 mg Oral Q6H PRN Ivor Costa, MD      ? albuterol (PROVENTIL) (2.5 MG/3ML) 0.083% nebulizer solution 3 mL  3 mL Inhalation Q4H PRN Ivor Costa, MD      ? Derrill Memo ON 10/18/2021] atorvastatin (LIPITOR) tablet 10 mg  10 mg Oral Daily Ivor Costa, MD      ? clindamycin (CLEOCIN) IVPB 900 mg  900 mg Intravenous Modena Slater, MD   Stopped at 10/17/21 1502  ? enoxaparin (LOVENOX) injection 60 mg  0.5 mg/kg Subcutaneous Q24H Ivor Costa, MD   60 mg at 10/17/21 1732  ? hydrALAZINE (APRESOLINE) injection 5 mg  5 mg Intravenous Q2H PRN Ivor Costa, MD      ? ibuprofen (ADVIL) tablet 200 mg  200 mg Oral Q6H PRN Ivor Costa, MD      ? insulin aspart (novoLOG) injection 0-5 Units  0-5 Units Subcutaneous QHS Ivor Costa, MD      ? insulin aspart (novoLOG) injection 0-9 Units  0-9 Units Subcutaneous TID WC Ivor Costa, MD      ? Derrill Memo ON 10/18/2021] lisinopril (ZESTRIL) tablet 20 mg  20 mg Oral Daily Ivor Costa, MD      ? morphine (PF) 2 MG/ML injection 2 mg  2 mg Intravenous Q4H PRN Ivor Costa, MD      ? nicotine (NICODERM CQ - dosed in mg/24 hours) patch 21 mg  21 mg Transdermal Daily Ivor Costa, MD      ? ondansetron [facility]) injection 4 mg  4 mg Intravenous Q8H PRN Ivor Costa, MD      ? oxyCODONE (Oxy IR/ROXICODONE) immediate  release tablet 5 mg  5 mg Oral Q4H PRN Ivor Costa, MD      ? Derrill Memo ON 10/18/2021] pantoprazole (PROTONIX) EC tablet 40 mg  40 mg Oral Daily Ivor Costa, MD      ?  ? ?  Abtx:  ?Anti-infectives (From admission, onward)  ? ? Start     Dose/Rate Route Frequency Ordered Stop  ? 10/17/21 1415  ceFAZolin (ANCEF) IVPB 2g/100 mL premix       ? 2 g ?200 mL/hr over 30 Minutes Intravenous  Once 10/17/21 1400 10/17/21 1432  ? 10/17/21 1415  clindamycin (CLEOCIN) IVPB 900 mg       ? 900 mg ?100 mL/hr over 30 Minutes Intravenous Every 8 hours 10/17/21 1404    ? ?  ? ? ?REVIEW OF SYSTEMS:  ?Const: fever,  chills, negative weight loss ?Eyes: negative diplopia or visual changes, negative eye pain ?ENT: negative coryza, negative sore throat ?Resp: negative cough, hemoptysis, dyspnea ?Cards: negative for chest pain, palpitations, lower extremity edema ?GU: negative for frequency, dysuria and hematuria ?GI: Negative for abdominal pain, diarrhea, bleeding, constipation ?Skin: negative for rash and pruritus ?Heme: negative for easy bruising and gum/nose bleeding ?MS: Pain left leg ?Neurolo:negative for headaches, dizziness, vertigo, memory problems  ?Psych: negative for feelings of anxiety, depression  ?Endocrine: diabetes ?Allergy/Immunology-as above ?Objective:  ?VITALS:  ?BP 135/77 (BP Location: Left Arm)   Pulse 86   Temp 99.1 ?F (37.3 ?C) (Oral)   Resp 18   Ht 5\' 10"  (1.778 m)   Wt 121.1 kg   SpO2 99%   BMI 38.31 kg/m?  ?PHYSICAL EXAM:  ?General: Alert, cooperative, no distress, appears stated age.  ?Head: Normocephalic, without obvious abnormality, atraumatic. ?Eyes: Conjunctivae clear, anicteric sclerae. Pupils are equal ?ENT Nares normal. No drainage or sinus tenderness. ?Lips, mucosa, and tongue normal. No Thrush ?Neck: Supple, symmetrical, no adenopathy, thyroid: non tender ?no carotid bruit and no JVD. ?Back: No CVA tenderness. ?Lungs: Clear to auscultation bilaterally. No Wheezing or Rhonchi. No rales. ?Heart: Regular rate  and rhythm, no murmur, rub or gallop. ?Abdomen: Soft, non-tender,not distended. Bowel sounds normal. No masses ?Extremities: Left leg below the knee is angry red.  There is some edema.  Of the feet as well.  Compared to yesterd

## 2021-10-18 ENCOUNTER — Inpatient Hospital Stay: Payer: 59

## 2021-10-18 ENCOUNTER — Encounter: Payer: Self-pay | Admitting: Internal Medicine

## 2021-10-18 DIAGNOSIS — L03116 Cellulitis of left lower limb: Secondary | ICD-10-CM | POA: Diagnosis not present

## 2021-10-18 DIAGNOSIS — R7989 Other specified abnormal findings of blood chemistry: Secondary | ICD-10-CM | POA: Diagnosis not present

## 2021-10-18 DIAGNOSIS — I10 Essential (primary) hypertension: Secondary | ICD-10-CM | POA: Diagnosis not present

## 2021-10-18 HISTORY — DX: Morbid (severe) obesity due to excess calories: E66.01

## 2021-10-18 LAB — CULTURE, BLOOD (ROUTINE X 2)
Culture: NO GROWTH
Culture: NO GROWTH
Special Requests: ADEQUATE

## 2021-10-18 LAB — CBC
HCT: 42 % (ref 39.0–52.0)
Hemoglobin: 14.2 g/dL (ref 13.0–17.0)
MCH: 30.3 pg (ref 26.0–34.0)
MCHC: 33.8 g/dL (ref 30.0–36.0)
MCV: 89.6 fL (ref 80.0–100.0)
Platelets: 189 10*3/uL (ref 150–400)
RBC: 4.69 MIL/uL (ref 4.22–5.81)
RDW: 13.5 % (ref 11.5–15.5)
WBC: 10 10*3/uL (ref 4.0–10.5)
nRBC: 0 % (ref 0.0–0.2)

## 2021-10-18 LAB — COMPREHENSIVE METABOLIC PANEL
ALT: 88 U/L — ABNORMAL HIGH (ref 0–44)
AST: 52 U/L — ABNORMAL HIGH (ref 15–41)
Albumin: 3.1 g/dL — ABNORMAL LOW (ref 3.5–5.0)
Alkaline Phosphatase: 116 U/L (ref 38–126)
Anion gap: 8 (ref 5–15)
BUN: 14 mg/dL (ref 6–20)
CO2: 22 mmol/L (ref 22–32)
Calcium: 8.5 mg/dL — ABNORMAL LOW (ref 8.9–10.3)
Chloride: 103 mmol/L (ref 98–111)
Creatinine, Ser: 0.84 mg/dL (ref 0.61–1.24)
GFR, Estimated: >60 mL/min (ref >60)
Glucose, Bld: 205 mg/dL — ABNORMAL HIGH (ref 70–99)
Potassium: 3.9 mmol/L (ref 3.5–5.1)
Sodium: 133 mmol/L — ABNORMAL LOW (ref 135–145)
Total Bilirubin: 1 mg/dL (ref 0.3–1.2)
Total Protein: 6.7 g/dL (ref 6.5–8.1)

## 2021-10-18 LAB — HEMOGLOBIN A1C
Hgb A1c MFr Bld: 6.5 % — ABNORMAL HIGH (ref 4.8–5.6)
Mean Plasma Glucose: 139.85 mg/dL

## 2021-10-18 LAB — GLUCOSE, CAPILLARY
Glucose-Capillary: 174 mg/dL — ABNORMAL HIGH (ref 70–99)
Glucose-Capillary: 139 mg/dL — ABNORMAL HIGH (ref 70–99)
Glucose-Capillary: 180 mg/dL — ABNORMAL HIGH (ref 70–99)
Glucose-Capillary: 152 mg/dL — ABNORMAL HIGH (ref 70–99)

## 2021-10-18 MED ORDER — SODIUM CHLORIDE 0.9 % IV SOLN: INTRAVENOUS | Status: DC | PRN

## 2021-10-18 NOTE — TOC CM/SW Note (Signed)
?  Transition of Care (TOC) Screening Note ? ? ?Patient Details  ?Name: Jeffrey [name] ?Date of Birth: 1961/04/03 ? ? ?Transition of Care (TOC) CM/SW Contact:    ?Chandon Lazcano E Jonelle Bann, LCSW ?Phone Number: ?10/18/2021, 9:58 AM ? ? ? ?Transition of Care Department [facility]) has reviewed patient and no TOC needs have been identified at this time. We will continue to monitor patient advancement through interdisciplinary progression rounds. If new patient transition needs arise, please place a TOC consult. ? ? ?

## 2021-10-18 NOTE — Progress Notes (Signed)
[facility] Progress Note ? ?Patient: Jeffrey [name]    [id number]  DOA: 10/17/2021    ?Date of Service: the patient was seen and examined on 10/18/2021 ? ?Brief hospital course: ?61 year old male with past medical history of recurrent strep cellulitis, obstructive sleep apnea, hypertension, diabetes and morbid obesity who presented to the emergency room on 5/5 with left leg pain, erythema and swelling x5 days and found to have a left leg cellulitis with possible erysipelas and lymphangitis.  Patient was admitted to the hospitalist service with infectious disease consultation and started on IV Ancef and clindamycin.  Sepsis was ruled out. ? ?Assessment and Plan: ?Assessment and Plan: ?* Cellulitis of left leg ?Pt has cellulitis of left leg, and possible erysipelas and lymphagitis per Dr. Delaine Lame of ID. She recommended to treat patient with Ancef and clindamycin.  Sepsis ruled out. ? ?Lower extremity Doppler has ruled out DVT.  Patient appears to be improving.  White blood cell count was 14 a few days ago and on p.o. antibiotics, 1 can see cellulitis was already starting to recede although still quite prominent and significant in several areas.  Plan to continue IV antibiotics ? ?Abnormal LFTs ?Unclear etiology although appears to be improving.  Possibly from shock liver from hypotension which occurred prior to coming in?  Recheck LFTs in a few days. ? ?Type II diabetes mellitus with renal manifestations ([place]) ?Recent A1c 6.6, well controlled.  Patient taking Invokana ?- Sliding scale insulin ? ?Sleep apnea ?- CPAP ? ?Essential hypertension ?- IV hydralazine as needed ?-Lisinopril ? ?HLD (hyperlipidemia) ?- Lipitor ? ?Morbid obesity ([place]) ?Meets criteria with BMI > 35 plus co-morbidities of HTN and diabetes, ? ? ? ? ? ? ?Body mass index is 38.31 kg/m?.  ?  ?   ? ?Consultants: ?Infectious disease ? ?Procedures: ?None ? ?Antimicrobials: ?IV Ancef and clindamycin 5/5-present. ?Prior to admission,  patient had been on p.o. Duricef x5 days ? ?Code Status: Full code ? ? ?Subjective: Patient complains of severe leg pain when his leg is not propped up and especially when he is standing upright. ? ?Objective: ?Vital signs were reviewed and unremarkable. ?Vitals:  ? 10/17/21 1952 10/18/21 0851  ?BP: 124/78 109/70  ?Pulse: 80 82  ?Resp: 18 18  ?Temp: 99.4 ?F (37.4 ?C) 98.1 ?F (36.7 ?C)  ?SpO2: 95% 96%  ? ? ?Intake/Output Summary (Last 24 hours) at 10/18/2021 1408 ?Last data filed at 10/18/2021 1027 ?Gross per 24 hour  ?Intake 1375.69 ml  ?Output 1600 ml  ?Net -224.31 ml  ? ?Filed Weights  ? 10/17/21 1117  ?Weight: 121.1 kg  ? ?Body mass index is 38.31 kg/m?. ? ?Exam: ? ?General: Alert and oriented x3, no acute distress ?HEENT: Normocephalic and atraumatic, mucous membranes are moist ?Cardiovascular: Regular rate and rhythm, S1-S2 ?Respiratory: Clear to auscultation bilaterally ?Abdomen: Soft, nontender, nondistended, positive bowel sounds ?Musculoskeletal: No clubbing or cyanosis, 1-2+ edema of the left lower extremity as compared to the right ?Skin: Patient has a marked erythema on the anterior aspect of his left leg starting below the knee and going down to right above the foot.  According to sharpie lines that were drawn when he first was noted to have cellulitis, it has since receded from the dorsal aspect of his foot ?Psychiatry: Appropriate, no evidence of psychoses ?Neurology: No focal deficits ? ?Data Reviewed: ?A1c is pending.  White blood cell count completely normalized.  Mildly elevated CRP at 17 ? ?Disposition:  ?Status is: Inpatient ?Remains inpatient appropriate because: Continued  need for IV antibiotics ?  ? ?Anticipated discharge date: 5/8 or 5/9 ? ?Remaining issues to be resolved so that patient can be discharged: Need to see cellulitis much recessed and patient able to ambulate somewhat comfortably ? ? ?Family Communication: Wife at the bedside ?DVT Prophylaxis: ?  Lovenox ? ? ? ?Author: ?Annita Brod ,MD ?10/18/2021 2:08 PM ? ?To reach On-call, see care teams to locate the attending and reach out via [email or web address]. ?Between 7PM-7AM, please contact night-coverage ?If you still have difficulty reaching the attending provider, please page the [facility] (Director on Call) for [facility] on amion for assistance. ? ?

## 2021-10-18 NOTE — Hospital Course (Addendum)
61 year old male with past medical history of recurrent strep cellulitis, obstructive sleep apnea, hypertension, diabetes and morbid obesity who presented to the emergency room on 5/5 with left leg pain, erythema and swelling x5 days and found to have a left leg cellulitis with possible erysipelas and lymphangitis.  Patient was admitted to the hospitalist service with infectious disease consultation and started on IV Ancef and clindamycin.  Sepsis was ruled out.  Cellulitis although very dense has been slowly responding to IV antibiotics. ? ?5/9: On IV cefazolin and clindamycin with slow improvement.  ID following ?5/10: Vascular surgery consult.  Clindamycin stopped ?

## 2021-10-18 NOTE — Assessment & Plan Note (Addendum)
Meets criteria with BMI > 35 plus co-morbidities of HTN and diabetes.  Counseled for weight loss ?

## 2021-10-19 DIAGNOSIS — R7989 Other specified abnormal findings of blood chemistry: Secondary | ICD-10-CM | POA: Diagnosis not present

## 2021-10-19 DIAGNOSIS — I10 Essential (primary) hypertension: Secondary | ICD-10-CM | POA: Diagnosis not present

## 2021-10-19 DIAGNOSIS — L03116 Cellulitis of left lower limb: Secondary | ICD-10-CM | POA: Diagnosis not present

## 2021-10-19 LAB — GLUCOSE, CAPILLARY
Glucose-Capillary: 165 mg/dL — ABNORMAL HIGH (ref 70–99)
Glucose-Capillary: 154 mg/dL — ABNORMAL HIGH (ref 70–99)
Glucose-Capillary: 209 mg/dL — ABNORMAL HIGH (ref 70–99)
Glucose-Capillary: 180 mg/dL — ABNORMAL HIGH (ref 70–99)

## 2021-10-19 MED ORDER — MORPHINE SULFATE (PF) 2 MG/ML IV SOLN
2.0000 mg | INTRAVENOUS | Status: DC | PRN
Start: 2021-10-19 — End: 2021-10-22
  Administered 2021-10-20: 2 mg via INTRAVENOUS
  Filled 2021-10-19: qty 1

## 2021-10-19 NOTE — Progress Notes (Signed)
[facility] Progress Note ? ?Patient: Jeffrey [name]    TGG:[id number]  DOA: 10/17/2021    ?Date of Service: the patient was seen and examined on 10/19/2021 ? ?Brief hospital course: ?61 year old male with past medical history of recurrent strep cellulitis, obstructive sleep apnea, hypertension, diabetes and morbid obesity who presented to the emergency room on 5/5 with left leg pain, erythema and swelling x5 days and found to have a left leg cellulitis with possible erysipelas and lymphangitis.  Patient was admitted to the hospitalist service with infectious disease consultation and started on IV Ancef and clindamycin.  Sepsis was ruled out. ? ?Assessment and Plan: ?Assessment and Plan: ?* Cellulitis of left leg ?Pt has cellulitis of left leg, and possible erysipelas and lymphagitis per Dr. Rivka Safer of ID. She recommended to treat patient with Ancef and clindamycin.  Sepsis ruled out. ? ?Lower extremity Doppler has ruled out DVT.  Patient appears to be improving very slowly on IV antibiotics.  Compared to previous day, cellulitis has receded about a half an inch.  Patient is quite hesitant to stand due to blood flowing into the leg causing severe pain.  Encouraged him to take pain medication ahead of time, but it is important for him to be somewhat ambulatory so that he does not become severely deconditioned. ? ?Abnormal LFTs ?Unclear etiology although appears to be improving.  Possibly from shock liver from hypotension which occurred prior to coming in?  Recheck LFTs tomorrow ? ?Type II diabetes mellitus with renal manifestations (HCC) ?Recent A1c 6.6, well controlled.  Patient taking Invokana ?- Sliding scale insulin ? ?Sleep apnea ?- CPAP ? ?Essential hypertension ?- IV hydralazine as needed ?-Lisinopril ? ?HLD (hyperlipidemia) ?- Lipitor ? ?Morbid obesity (HCC) ?Meets criteria with BMI > 35 plus co-morbidities of HTN and diabetes, ? ? ? ? ? ? ?Body mass index is 38.31 kg/m?.  ?  ?    ? ?Consultants: ?Infectious disease ? ?Procedures: ?None ? ?Antimicrobials: ?IV Ancef and clindamycin 5/5-present. ?Prior to admission, patient had been on p.o. Duricef x5 days ? ?Code Status: Full code ? ? ?Subjective: No leg pain, because patient has not gotten out of bed since yesterday. ? ?Objective: ?Vital signs were reviewed and unremarkable. ?Vitals:  ? 10/19/21 0532 10/19/21 0810  ?BP: 113/77 (!) 105/58  ?Pulse: 79 75  ?Resp: 16 18  ?Temp: 98.7 ?F (37.1 ?C) 98.5 ?F (36.9 ?C)  ?SpO2: 94% 96%  ? ? ?Intake/Output Summary (Last 24 hours) at 10/19/2021 1325 ?Last data filed at 10/19/2021 1006 ?Gross per 24 hour  ?Intake 1267.47 ml  ?Output 2075 ml  ?Net -807.53 ml  ? ? ?Filed Weights  ? 10/17/21 1117  ?Weight: 121.1 kg  ? ?Body mass index is 38.31 kg/m?. ? ?Exam: ? ?General: Alert and oriented x3, no acute distress ?HEENT: Normocephalic and atraumatic, mucous membranes are moist ?Cardiovascular: Regular rate and rhythm, S1-S2 ?Respiratory: Clear to auscultation bilaterally ?Abdomen: Soft, nontender, nondistended, positive bowel sounds ?Musculoskeletal: No clubbing or cyanosis, 1-2+ edema of the left lower extremity as compared to the right ?Skin: Patient has a marked erythema on the anterior aspect of his left leg starting below the knee and going down to right above the foot.  Compared to previous day, cellulitis has receded about a half an inch. ?Psychiatry: Appropriate, no evidence of psychoses ?Neurology: No focal deficits ? ?Data Reviewed: ?A1c at 6.5. ? ?Disposition:  ?Status is: Inpatient ?Remains inpatient appropriate because: Continued need for IV antibiotics ?  ? ?Anticipated discharge date: 5/9  or 5/10 ? ?Remaining issues to be resolved so that patient can be discharged: Need to see cellulitis much recessed and patient able to ambulate somewhat comfortably ? ? ?Family Communication: Wife at the bedside ?DVT Prophylaxis: ?  Lovenox ? ? ? ?Author: ?Hollice Espy ,MD ?10/19/2021 1:25 PM ? ?To reach  On-call, see care teams to locate the attending and reach out via [email or web address]. ?Between 7PM-7AM, please contact night-coverage ?If you still have difficulty reaching the attending provider, please page the [facility] (Director on Call) for [facility] on amion for assistance. ? ?

## 2021-10-19 NOTE — Plan of Care (Signed)
  Problem: Activity: Goal: Risk for activity intolerance will decrease Outcome: Progressing   Problem: Nutrition: Goal: Adequate nutrition will be maintained Outcome: Progressing   Problem: Pain Managment: Goal: General experience of comfort will improve Outcome: Progressing   Problem: Safety: Goal: Ability to remain free from injury will improve Outcome: Progressing   

## 2021-10-19 NOTE — Plan of Care (Signed)
Pt AAOx4, moderate pain in LLE. VS are stable, wife is at bedside. Plan for pain control, abx therapy and PT eval. Bed is in lowest position, call light within reach. Will continue to monitor. ?

## 2021-10-20 DIAGNOSIS — I10 Essential (primary) hypertension: Secondary | ICD-10-CM | POA: Diagnosis not present

## 2021-10-20 DIAGNOSIS — R7989 Other specified abnormal findings of blood chemistry: Secondary | ICD-10-CM | POA: Diagnosis not present

## 2021-10-20 DIAGNOSIS — L03116 Cellulitis of left lower limb: Secondary | ICD-10-CM | POA: Diagnosis not present

## 2021-10-20 LAB — CBC
HCT: 41.3 % (ref 39.0–52.0)
Hemoglobin: 13.9 g/dL (ref 13.0–17.0)
MCH: 30 pg (ref 26.0–34.0)
MCHC: 33.7 g/dL (ref 30.0–36.0)
MCV: 89.2 fL (ref 80.0–100.0)
Platelets: 268 10*3/uL (ref 150–400)
RBC: 4.63 MIL/uL (ref 4.22–5.81)
RDW: 13.1 % (ref 11.5–15.5)
WBC: 10.4 10*3/uL (ref 4.0–10.5)
nRBC: 0 % (ref 0.0–0.2)

## 2021-10-20 LAB — COMPREHENSIVE METABOLIC PANEL
ALT: 68 U/L — ABNORMAL HIGH (ref 0–44)
AST: 43 U/L — ABNORMAL HIGH (ref 15–41)
Albumin: 2.8 g/dL — ABNORMAL LOW (ref 3.5–5.0)
Alkaline Phosphatase: 98 U/L (ref 38–126)
Anion gap: 7 (ref 5–15)
BUN: 15 mg/dL (ref 6–20)
CO2: 25 mmol/L (ref 22–32)
Calcium: 8.2 mg/dL — ABNORMAL LOW (ref 8.9–10.3)
Chloride: 104 mmol/L (ref 98–111)
Creatinine, Ser: 0.79 mg/dL (ref 0.61–1.24)
GFR, Estimated: >60 mL/min (ref >60)
Glucose, Bld: 140 mg/dL — ABNORMAL HIGH (ref 70–99)
Potassium: 4 mmol/L (ref 3.5–5.1)
Sodium: 136 mmol/L (ref 135–145)
Total Bilirubin: 0.7 mg/dL (ref 0.3–1.2)
Total Protein: 6.3 g/dL — ABNORMAL LOW (ref 6.5–8.1)

## 2021-10-20 LAB — GLUCOSE, CAPILLARY
Glucose-Capillary: 179 mg/dL — ABNORMAL HIGH (ref 70–99)
Glucose-Capillary: 190 mg/dL — ABNORMAL HIGH (ref 70–99)

## 2021-10-20 MED ORDER — ORAL CARE MOUTH RINSE: 15.0000 mL | Freq: Two times a day (BID) | OROMUCOSAL | Status: DC

## 2021-10-20 MED ORDER — KETOROLAC TROMETHAMINE 30 MG/ML IJ SOLN
30.0000 mg | Freq: Three times a day (TID) | INTRAMUSCULAR | Status: DC
  Administered 2021-10-20 – 2021-10-22 (×7): 30 mg via INTRAVENOUS
  Filled 2021-10-20 (×7): qty 1

## 2021-10-20 NOTE — Consult Note (Signed)
WOC Nurse Consult Note: ?Reason for Consult: Infection of LLE ?Wound type:Infectious ?Pressure Injury POA: N/A ?Measurement: 30cm (length) x circumferential discoloration of LLE, purple ?Wound bed:N/A ?Drainage (amount, consistency, odor) N/A ?Periwound: N/A ?Dressing procedure/placement/frequency: Patient seen at the request of Dr. Rivka Safer  ?in the event that the area opens and topical care is required.  ?The WOC Nursing will stand by for that and offer suggestions for topical care if indicated. In the interim, patient's LE is on DermaTherapy bed linen system, a low CoF, antimicrobial, moisture wicking fabric. Patient is provided with information about DermaTherapy and is interested in something innovative in textiles from [employer]. ? ?WOC nursing team will not follow, but will remain available to this patient, the nursing and medical teams.  Please re-consult if needed. ?Thanks, ?Ladona Mow, MSN, RN, GNP, CWOCN, CWON-AP, FAAN  ?Pager# [phone]  ? ? ? ?  ?

## 2021-10-20 NOTE — Progress Notes (Signed)
ID ?Wife and friends at bed side ? ?Pt says he still has pain left leg on weight bearing ?No fever or chills ? ?O/e ?Awake and alert and cheerful ?Patient Vitals for the past 24 hrs: ? BP Temp Temp src Pulse Resp SpO2  ?10/20/21 0722 110/63 97.7 ?F (36.5 ?C) Oral 78 16 96 %  ?10/19/21 2043 115/74 98.7 ?F (37.1 ?C) Oral 80 18 96 %  ? Chest CTA ?Hss1s2 ?Abd soft ?CNS non focal ?Left leg ?Superficial darkening and petechiae of the skin ?Edema better ?Erythema better ?Some tenderness present ?10/20/21 ? ? ?10/20/21 ? ? ?10/17/21 ? ? ?Labs ? ?  Latest Ref Rng & Units 10/20/2021  ?  4:12 AM 10/18/2021  ?  9:25 AM 10/17/2021  ? 11:18 AM  ?CBC  ?WBC 4.0 - 10.5 K/uL 10.4   10.0   11.2    ?Hemoglobin 13.0 - 17.0 g/dL 29.5   28.4   13.2    ?Hematocrit 39.0 - 52.0 % 41.3   42.0   45.8    ?Platelets 150 - 400 K/uL 268   189   167    ?  ? ?  Latest Ref Rng & Units 10/20/2021  ?  4:12 AM 10/18/2021  ?  9:25 AM 10/17/2021  ? 11:18 AM  ?CMP  ?Glucose 70 - 99 mg/dL 440   102   725    ?BUN 6 - 20 mg/dL 15   14   16     ?Creatinine 0.61 - 1.24 mg/dL   3.66   4.40    ?Sodium 135 - 145 mmol/L 136   133   136    ?Potassium 3.5 - 5.1 mmol/L 4.0   3.9   3.8    ?Chloride 98 - 111 mmol/L 104   103   104    ?CO2 22 - 32 mmol/L 25   22   24     ?Calcium 8.9 - 10.3 mg/dL 8.2   8.5   9.2    ?Total Protein 6.5 - 8.1 g/dL 6.3   6.7   7.1    ?Total Bilirubin 0.3 - 1.2 mg/dL 0.7   1.0   1.2    ?Alkaline Phos 38 - 126 U/L 98   116   130    ?AST 15 - 41 U/L 43   52   83    ?ALT 0 - 44 U/L 68   88   128    ? Micro ?[facility] 10/17/21- no growth ? ? ?Impression/recommendation ?Severe erysipelas/cellulitis/lymphangitis of the left leg likely due to Group A strep ?Currently on cefazolin and clindamycin  with slow improvement ?Has darkening of skin with superficial petechiae ?Will consult wound care  ?Will stop clindamycin after tomorrow ? ?DM on invokana ? ?Discussed the management with patient and his wife ?

## 2021-10-20 NOTE — Progress Notes (Addendum)
[facility] Progress Note ? ?Patient: Jeffrey [name]    RFX:[id number]  DOA: 10/17/2021    ?Date of Service: the patient was seen and examined on 10/20/2021 ? ?Brief hospital course: ?61 year old male with past medical history of recurrent strep cellulitis, obstructive sleep apnea, hypertension, diabetes and morbid obesity who presented to the emergency room on 5/5 with left leg pain, erythema and swelling x5 days and found to have a left leg cellulitis with possible erysipelas and lymphangitis.  Patient was admitted to the hospitalist service with infectious disease consultation and started on IV Ancef and clindamycin.  Sepsis was ruled out.  Cellulitis although very dense has been slowly responding to IV antibiotics. ? ?Assessment and Plan: ?Assessment and Plan: ?* Cellulitis of left leg ?Pt has cellulitis of left leg, and possible erysipelas and lymphagitis per Dr. Rivka Safer of ID. She recommended to treat patient with Ancef and clindamycin.  Sepsis ruled out. ? ?Lower extremity Doppler has ruled out DVT.  Patient appears to be improving very slowly on IV antibiotics.  Compared to previous day, each day, shows improvement.  Compared to initial pictures when diffusely bright erythematous throughout, it does not become much more darker in nature as it clears up.  Patient is quite hesitant to stand due to blood flowing into the leg causing severe pain.  Encouraged him to take pain medication ahead of time, but it is important for him to be somewhat ambulatory so that he does not become severely deconditioned.  Have added some IV Toradol to help. ? ?Abnormal LFTs ?Unclear etiology although appears to be improving.  Possibly from shock liver from hypotension.  Repeat labs today note continued improvement. ? ?Type II diabetes mellitus with renal manifestations (HCC) ?Recent A1c 6.6, well controlled.  Patient taking Invokana ?- Sliding scale insulin ? ?Sleep apnea ?- CPAP ? ?Essential hypertension ?- IV  hydralazine as needed ?-Lisinopril ? ?HLD (hyperlipidemia) ?- Lipitor ? ?Morbid obesity (HCC) ?Meets criteria with BMI > 35 plus co-morbidities of HTN and diabetes, ? ? ? ? ? ? ?Body mass index is 38.31 kg/m?.  ?  ?   ? ?Consultants: ?Infectious disease ? ?Procedures: ?None ? ?Antimicrobials: ?IV Ancef and clindamycin 5/5-present. ?Prior to admission, patient had been on p.o. Duricef x5 days ? ?Code Status: Full code ? ? ?Subjective: With significant leg pain whenever he tries to stand and ambulate. ? ?Objective: ?Vital signs were reviewed and unremarkable. ?Vitals:  ? 10/19/21 2043 10/20/21 0722  ?BP: 115/74 110/63  ?Pulse: 80 78  ?Resp: 18 16  ?Temp: 98.7 ?F (37.1 ?C) 97.7 ?F (36.5 ?C)  ?SpO2: 96% 96%  ? ? ?Intake/Output Summary (Last 24 hours) at 10/20/2021 1454 ?Last data filed at 10/20/2021 1431 ?Gross per 24 hour  ?Intake 793.37 ml  ?Output 300 ml  ?Net 493.37 ml  ? ? ?Filed Weights  ? 10/17/21 1117  ?Weight: 121.1 kg  ? ?Body mass index is 38.31 kg/m?. ? ?Exam: ? ?General: Alert and oriented x3, no acute distress ?HEENT: Normocephalic and atraumatic, mucous membranes are moist ?Cardiovascular: Regular rate and rhythm, S1-S2 ?Respiratory: Clear to auscultation bilaterally ?Abdomen: Soft, nontender, nondistended, positive bowel sounds ?Musculoskeletal: No clubbing or cyanosis, 1-2+ edema of the left lower extremity as compared to the right ?Skin: Patient has a marked erythema on the anterior aspect of his left leg starting below the knee and going down to right above the foot.  Compared to previous day, cellulitis has receded about a half an inch.  Very dark-colored in nature  as compared to on admission. ?Psychiatry: Appropriate, no evidence of psychoses ?Neurology: No focal deficits ? ?Data Reviewed: ?Transaminases slowly improving as compared to 5/6 labs ? ?Disposition:  ?Status is: Inpatient ?Remains inpatient appropriate because: Continued need for IV antibiotics ?  ? ?Anticipated discharge date: 5/10 or  5/11 ? ?Remaining issues to be resolved so that patient can be discharged: Need to see cellulitis much recessed and patient able to ambulate somewhat comfortably ? ? ?Family Communication: Wife at the bedside ?DVT Prophylaxis: ?  Lovenox ? ? ? ?Author: ?Hollice Espy ,MD ?10/20/2021 2:54 PM ? ?To reach On-call, see care teams to locate the attending and reach out via [email or web address]. ?Between 7PM-7AM, please contact night-coverage ?If you still have difficulty reaching the attending provider, please page the [facility] (Director on Call) for [facility] on amion for assistance. ? ?

## 2021-10-20 NOTE — Plan of Care (Signed)
Pt AAOx4, mild pain. VS are WNL. Plan for pain control and PT. Bed is in lowest position, call light within reach. Will continue to monitor.  ?

## 2021-10-20 NOTE — Evaluation (Signed)
Physical Therapy Evaluation ?Patient Details ?Name: Jeffrey [name] ?MRN: [id number] ?DOB: 02-17-1961 ?Today's Date: 10/20/2021 ? ?History of Present Illness ? Per EMR MD note on 10/17/21: Jeffrey [name] is a 61 y.o. male with past medical history of hypertension and diabetes who presents to the ED complaining of left leg swelling and pain.  Patient reports that he first developed pain and swelling in the lower part of his left leg about 5 days ago, was seen in the [facility], ED at that time and admitted for cellulitis which was treated with IV Rocephin.  Patient reports that he had initial improvement in the redness and was discharged home on cefadroxil.  He has been taking this consistently and followed up with infectious disease yesterday, at which time he noted increasing redness and swelling that was tracking up his leg.  He additionally has started to notice redness along his left medial thigh in a lymphatic distribution.  He was referred to the ED for further evaluation along with IV antibiotics by infectious disease.  Patient states he has been having fevers as high as 102, including earlier today.  He has not had any Tylenol or ibuprofen today.  He has dealt with recurrent infections in this leg previously, denies any recent trauma. ?  ?Clinical Impression ? Pt admitted with above diagnosis. Pt received upright in bed, LLE elevated on 4+ pillows. Pt reports pain despite medication but eager to attempt OOB mobility. 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 Pt requiring encouragement, increased time for transfers today due to pain. Pt mod-I with bed mobility to sit EoB relying on elevated LLE on recliner next to bed to not place LE in dependent position. Pt able to stand with only RLE and BUE's to RW with minguard and excellent stability but did rely on LLE resting on recliner. PT encouraged pt to sit and bring LLE down at EOB. Pt able to brig LLE down at [facility], stand with  same assist level and stand pivot transfer to recliner with good hand placement and eccentric control. LLE elevated on 4 pillows with education provided on elevation and LLE therex for edema and pain management. Pt verbalizing understanding. Anticipate pending progression of mobility with ambulating short distances and stair navigation, pt will be safe to d/c home. Pain manage seems to be biggest barrier to progressing mobility at this time as pt reporting 10/10 pain despite pain management. Pt left in recliner with all needs in reach. Pt currently with functional limitations due to the deficits listed below (see PT Problem List). Pt will benefit from skilled PT to increase their independence and safety with mobility to allow discharge to the venue listed below.     ?   ? ?Recommendations for follow up therapy are one component of a multi-disciplinary discharge planning process, led by the attending physician.  Recommendations may be updated based on patient status, additional functional criteria and insurance authorization. ? ?Follow Up Recommendations Home health PT ? ?  ?Assistance Recommended at Discharge Frequent or constant Supervision/Assistance  ?Patient can return home with the following ? A little help with walking and/or transfers;Assist for transportation;Help with stairs or ramp for entrance ? ?  ?Equipment Recommendations None recommended by PT  ?Recommendations for Other Services ?    ?  ?Functional Status Assessment Patient has had a recent decline in their functional status and demonstrates the ability to make significant improvements in function in a reasonable and predictable amount  of time.  ? ?  ?Precautions / Restrictions Precautions ?Precautions: Fall ?Restrictions ?Weight Bearing Restrictions: No  ? ?  ? ?Mobility ? Bed Mobility ?Overal bed mobility: Modified Independent ?  ?  ?  ?  ?  ?  ?General bed mobility comments: Use of HOB elevated, bed rails ?Patient Response:  Cooperative ? ?Transfers ?Overall transfer level: Needs assistance ?Equipment used: Rolling walker (2 wheels) ?Transfers: Sit to/from Stand, Bed to chair/wheelchair/BSC ?Sit to Stand: Min guard ?  ?Step pivot transfers: Min guard ?  ?  ?  ?General transfer comment: Excellent RLE and BUE strength to stand. LImited by lowering LLE in dependent position due to pain ?  ? ?Ambulation/Gait ?  ?  ?  ?  ?  ?  ?  ?  ? ?Stairs ?  ?  ?  ?  ?  ? ?Wheelchair Mobility ?  ? ?Modified Rankin (Stroke Patients Only) ?  ? ?  ? ?Balance Overall balance assessment: Needs assistance ?Sitting-balance support: No upper extremity supported, Feet supported ?Sitting balance-Leahy Scale: Normal ?  ?  ?  ?Standing balance-Leahy Scale: Fair ?Standing balance comment: Reliant on AD due to pain in LLE ?  ?  ?  ?  ?  ?  ?  ?  ?  ?  ?  ?   ? ? ? ?Pertinent Vitals/Pain Pain Assessment ?Pain Assessment: Faces ?Faces Pain Scale: Hurts worst ?Pain Location: L foot in dependent positions ?Pain Descriptors / Indicators: Dull, Grimacing, Guarding, Pounding, Moaning ?Pain Intervention(s): Limited activity within patient's tolerance, Premedicated before session, Repositioned  ? ? ?Home Living Family/patient expects to be discharged to:: Private residence ?Living Arrangements: Spouse/significant other ?Available Help at Discharge: Family;Available 24 hours/day ?Type of Home: House ?Home Access: Stairs to enter ?Entrance Stairs-Rails: None ?Entrance Stairs-Number of Steps: 1 ?  ?Home Layout: One level ?Home Equipment: Rolling Walker (2 wheels);BSC/3in1;Shower seat ?   ?  ?Prior Function Prior Level of Function : Independent/Modified Independent ?  ?  ?  ?  ?  ?  ?Mobility Comments: Works as a Engineer, civil (consulting) ?  ?  ? ? ?Hand Dominance  ?   ? ?  ?Extremity/Trunk Assessment  ? Upper Extremity Assessment ?Upper Extremity Assessment: Overall WFL for tasks assessed ?  ? ?Lower Extremity Assessment ?Lower Extremity Assessment: LLE deficits/detail;Generalized weakness ?LLE  Deficits / Details: LLE cellulitis, swelling, erythema ?LLE: Unable to fully assess due to pain ?  ? ?Cervical / Trunk Assessment ?Cervical / Trunk Assessment: Normal  ?Communication  ? Communication: No difficulties  ?Cognition Arousal/Alertness: Awake/alert ?Behavior During Therapy: [facility] for tasks assessed/performed ?Overall Cognitive Status: Within Functional Limits for tasks assessed ?  ?  ?  ?  ?  ?  ?  ?  ?  ?  ?  ?  ?  ?  ?  ?  ?  ?  ?  ? ?  ?General Comments   ? ?  ?Exercises General Exercises - Lower Extremity ?Ankle Circles/Pumps: AROM, Left, 10 reps, Supine ?Other Exercises ?Other Exercises: Role of PT in acute setting, d/c recs, LE therex for edema/pain management.  ? ?Assessment/Plan  ?  ?PT Assessment Patient needs continued PT services  ?PT Problem List Decreased strength;Decreased mobility;Decreased range of motion;Pain ? ?   ?  ?PT Treatment Interventions DME instruction;Therapeutic exercise;Gait training;Balance training;Stair training;Neuromuscular re-education;Functional mobility training;Therapeutic activities;Patient/family education   ? ?PT Goals (Current goals can be found in the Care Plan section)  ?Acute Rehab PT Goals ?Patient Stated Goal: To go  home, improve mobility ?PT Goal Formulation: With patient ?Time For Goal Achievement: 11/03/21 ?Potential to Achieve Goals: Good ? ?  ?Frequency 7X/week ?  ? ? ?Co-evaluation   ?  ?  ?  ?  ? ? ?  ?AM-PAC PT "6 Clicks" Mobility  ?Outcome Measure Help needed turning from your back to your side while in a flat bed without using bedrails?: A Little ?Help needed moving from lying on your back to sitting on the side of a flat bed without using bedrails?: A Little ?Help needed moving to and from a bed to a chair (including a wheelchair)?: A Little ?Help needed standing up from a chair using your arms (e.g., wheelchair or bedside chair)?: A Little ?Help needed to walk in hospital room?: A Lot ?Help needed climbing 3-5 steps with a railing? : Total ?6 Click  Score: 15 ? ?  ?End of Session Equipment Utilized During Treatment: Gait belt ?Activity Tolerance: Patient limited by pain;Patient tolerated treatment well ?Patient left: in chair;with call bell/phone within reach ?Nurse Commu

## 2021-10-21 DIAGNOSIS — L03116 Cellulitis of left lower limb: Secondary | ICD-10-CM | POA: Diagnosis not present

## 2021-10-21 DIAGNOSIS — A46 Erysipelas: Secondary | ICD-10-CM | POA: Diagnosis not present

## 2021-10-21 DIAGNOSIS — R7989 Other specified abnormal findings of blood chemistry: Secondary | ICD-10-CM | POA: Diagnosis not present

## 2021-10-21 DIAGNOSIS — E785 Hyperlipidemia, unspecified: Secondary | ICD-10-CM | POA: Diagnosis not present

## 2021-10-21 LAB — GLUCOSE, CAPILLARY
Glucose-Capillary: 170 mg/dL — ABNORMAL HIGH (ref 70–99)
Glucose-Capillary: 158 mg/dL — ABNORMAL HIGH (ref 70–99)
Glucose-Capillary: 189 mg/dL — ABNORMAL HIGH (ref 70–99)
Glucose-Capillary: 179 mg/dL — ABNORMAL HIGH (ref 70–99)

## 2021-10-21 NOTE — Progress Notes (Signed)
ID ?Wife at bedside ?Patient is doing better ?Walked with PT in the room and did some exercise ?Has less pain in the left leg especially due to taking Toradol ? ?O/e ?Looks well ?Patient Vitals for the past 24 hrs: ? BP Temp Temp src Pulse Resp SpO2  ?10/21/21 0756 102/65 98.1 ?F (36.7 ?C) -- 68 18 94 %  ?10/21/21 0417 110/64 97.7 ?F (36.5 ?C) Oral 76 20 96 %  ?10/20/21 2013 111/66 98.7 ?F (37.1 ?C) Oral 74 20 94 %  ? Chest CTA ?Hss1s2 ?Abd soft ?CNS non focal ?Left leg ?Superficial darkening and petechiae of the skin ?Edema better ?Erythema better ?Some tenderness present ?10/20/21 ? ? ?10/20/21 ? ? ?10/17/21 ? ? ?Labs ? ?  Latest Ref Rng & Units 10/20/2021  ?  4:12 AM 10/18/2021  ?  9:25 AM 10/17/2021  ? 11:18 AM  ?CBC  ?WBC 4.0 - 10.5 K/uL 10.4   10.0   11.2    ?Hemoglobin 13.0 - 17.0 g/dL 62.3   76.2   83.1    ?Hematocrit 39.0 - 52.0 % 41.3   42.0   45.8    ?Platelets 150 - 400 K/uL 268   189   167    ?  ? ?  Latest Ref Rng & Units 10/20/2021  ?  4:12 AM 10/18/2021  ?  9:25 AM 10/17/2021  ? 11:18 AM  ?CMP  ?Glucose 70 - 99 mg/dL 517   616   073    ?BUN 6 - 20 mg/dL 15   14   16     ?Creatinine 0.61 - 1.24 mg/dL   7.10   6.26    ?Sodium 135 - 145 mmol/L 136   133   136    ?Potassium 3.5 - 5.1 mmol/L 4.0   3.9   3.8    ?Chloride 98 - 111 mmol/L 104   103   104    ?CO2 22 - 32 mmol/L 25   22   24     ?Calcium 8.9 - 10.3 mg/dL 8.2   8.5   9.2    ?Total Protein 6.5 - 8.1 g/dL 6.3   6.7   7.1    ?Total Bilirubin 0.3 - 1.2 mg/dL 0.7   1.0   1.2    ?Alkaline Phos 38 - 126 U/L 98   116   130    ?AST 15 - 41 U/L 43   52   83    ?ALT 0 - 44 U/L 68   88   128    ? Micro ?[facility] 10/17/21- no growth ? ? ?Impression/recommendation ?Severe erysipelas/cellulitis/lymphangitis of the left leg likely due to Group A strep ?Currently on cefazolin and clindamycin  with steady improvement.  Clindamycin can be stopped after today. ?Has darkening of skin with superficial petechiae ?We will assess him tomorrow to see  how long he will need IV  antibiotics. ? ?DM on invokana ? ?Discussed the management with patient and his wife ?

## 2021-10-21 NOTE — Progress Notes (Signed)
?  Progress Note ? ? ?Patient: Jeffrey [name] [id number] DOB: 1960-08-12 DOA: 10/17/2021     4 ?DOS: the patient was seen and examined on 10/21/2021 ?  ?Brief hospital course: ?61 year old male with past medical history of recurrent strep cellulitis, obstructive sleep apnea, hypertension, diabetes and morbid obesity who presented to the emergency room on 5/5 with left leg pain, erythema and swelling x5 days and found to have a left leg cellulitis with possible erysipelas and lymphangitis.  Patient was admitted to the hospitalist service with infectious disease consultation and started on IV Ancef and clindamycin.  Sepsis was ruled out.  Cellulitis although very dense has been slowly responding to IV antibiotics. ? ?5/9: On IV cefazolin and clindamycin with slow improvement.  ID following ? ? ?Assessment and Plan: ?* Erysipelas of left lower extremity ?Cellulitis/erysipelas/ lymphagitis per Dr. Rivka Safer of ID. Sepsis ruled out. ? ?Lower extremity Doppler has ruled out DVT.  Patient responding well to IV cefazolin and clindamycin.  ID considering to stop clindamycin and possible discharge on oral antibiotic in next 1 to 2 days ? ?Abnormal LFTs ?Unclear etiology although appears to be improving.  Possibly from shock liver from hypotension.  Repeat labs shows improvement. ? ?Type II diabetes mellitus with renal manifestations (HCC) ?Recent A1c 6.6, well controlled.  Patient taking Invokana at home ?-Continue sliding scale insulin while in the hospital ? ?Sleep apnea ?- Continue CPAP ? ?Essential hypertension ?- Continue IV hydralazine as needed ?-Lisinopril ? ?HLD (hyperlipidemia) ?- Continue Lipitor ? ?Morbid obesity (HCC) ?Meets criteria with BMI > 35 plus co-morbidities of HTN and diabetes.  Counseled for weight loss ? ? ? ? ?  ? ?Subjective: Erythema is improving.  No fever.  Pain is much better controlled.  Wife at bedside.  Both seem to be concerned about premature discharge ? ?Physical Exam: ?Vitals:  ?  10/20/21 0722 10/20/21 2013 10/21/21 3893 10/21/21 0756  ?BP: 110/63 111/66 110/64 102/65  ?Pulse: 78 74 76 68  ?Resp: 16 20 20 18   ?Temp: 97.7 ?F (36.5 ?C) 98.7 ?F (37.1 ?C) 97.7 ?F (36.5 ?C) 98.1 ?F (36.7 ?C)  ?TempSrc: Oral Oral Oral   ?SpO2: 96% 94% 96% 94%  ?Weight:      ?Height:      ? ?? General: Alert and oriented x3, no acute distress ?? HEENT: Normocephalic and atraumatic, mucous membranes are moist ?? Cardiovascular: Regular rate and rhythm, S1-S2 ?? Respiratory: Clear to auscultation bilaterally ?? Abdomen: Soft, nontender, nondistended, positive bowel sounds ?? Skin: See the pictures ?? Psychiatry: Appropriate, no evidence of psychoses ?? Neurology: No focal deficits ? ? ? ? ? ? ? ?Data Reviewed: ? ?There are no new results to review at this time. ? ?Family Communication: Wife updated at bedside ? ?Disposition: ?Status is: Inpatient ?Remains inpatient appropriate because: Getting IV antibiotic ? ? Planned Discharge Destination: Home with Home Health ? ? ? DVT prophylaxis-Lovenox subcu ?Time spent: 35 minutes ? ?Author: ? , MD ?10/21/2021 12:54 PM ? ?For on call review [email or web address].  ?

## 2021-10-21 NOTE — Plan of Care (Signed)
Pt AAOx4, no pain. VS are WNL. Plan for abx and continued PT. Bed is in lowest position, call light within reach. Will continue to monitor. ?

## 2021-10-21 NOTE — Progress Notes (Signed)
Physical Therapy Treatment ?Patient Details ?Name: Jeffrey [name] ?MRN: [id number] ?DOB: Sep 13, 1960 ?Today's Date: 10/21/2021 ? ? ?History of Present Illness Per EMR MD note on 10/17/21: Jeffrey [name] is a 60 y.o. male with past medical history of hypertension and diabetes who presents to the ED complaining of left leg swelling and pain.  Patient reports that he first developed pain and swelling in the lower part of his left leg about 5 days ago, was seen in the [facility], ED at that time and admitted for cellulitis which was treated with IV Rocephin.  Patient reports that he had initial improvement in the redness and was discharged home on cefadroxil.  He has been taking this consistently and followed up with infectious disease yesterday, at which time he noted increasing redness and swelling that was tracking up his leg.  He additionally has started to notice redness along his left medial thigh in a lymphatic distribution.  He was referred to the ED for further evaluation along with IV antibiotics by infectious disease.  Patient states he has been having fevers as high as 102, including earlier today.  He has not had any Tylenol or ibuprofen today.  He has dealt with recurrent infections in this leg previously, denies any recent trauma. ? ?  ?PT Comments  ? ? Pt received ambulating with RW out of bathroom. Pt ambulating to recliner with UE reliance on RW, step to and antalgic gait on LLE but able to WB today. Pt returning to seated in recliner with supervision with PT, pt and spouse trouble shooting safe stair navigation since pt's pain is improving and he is able to walk. Pt reports no rails with single stair to enter but does not have adequate space to asc backwards with RW to turn around to enter home. Pt states in past he asc forwards with RW onto step and SUE on RW and Sue on door frame with success. Portable step/curb brought into room with pt displaying adequate and safe technique with preferred  method. Pt did require min VC's for stepping closer to step and sequencing LE's and RW with asc/desc the step for pt comfort and pain reduction on LLE. Pt returning to recliner in room with LE elevated. Pt making excellent progress towards goals with reduction in pain leading to successful ambulation and stairs training. Will continue to reinforce stair navigation and progress ambulation with LRAD and progress to step through gait at following session. D/c recs remain appropriate. ?   ?Recommendations for follow up therapy are one component of a multi-disciplinary discharge planning process, led by the attending physician.  Recommendations may be updated based on patient status, additional functional criteria and insurance authorization. ? ?Follow Up Recommendations ? Home health PT ?  ?  ?Assistance Recommended at Discharge Frequent or constant Supervision/Assistance  ?Patient can return home with the following A little help with walking and/or transfers;Assist for transportation;Help with stairs or ramp for entrance ?  ?Equipment Recommendations ? None recommended by PT  ?  ?Recommendations for Other Services   ? ? ?  ?Precautions / Restrictions Precautions ?Precautions: Fall ?Restrictions ?Weight Bearing Restrictions: No  ?  ? ?Mobility ? Bed Mobility ?  ?  ?  ?  ?  ?  ?  ?General bed mobility comments: NT. In bathroom upon arrival. ?Patient Response: Cooperative ? ?Transfers ?Overall transfer level: Needs assistance ?Equipment used: Rolling walker (2 wheels) ?Transfers: Sit to/from Stand ?Sit to Stand: Supervision ?  ?  ?  ?  ?  ?  ?  ? ?  Ambulation/Gait ?Ambulation/Gait assistance: Supervision ?Gait Distance (Feet): 40 Feet ?Assistive device: Rolling walker (2 wheels) ?Gait Pattern/deviations: Step-to pattern, Decreased stance time - left, Antalgic ?  ?  ?  ?General Gait Details: Heavy UE reliance on RW with step to antalgic gait pattern. Pt able to successfully weightbear on LLE today. ? ? ?Stairs ?Stairs:  Yes ?Stairs assistance: Min guard ?Stair Management: No rails, Step to pattern, Forwards, With walker ?Number of Stairs: 1 (two trials asc/desc single step) ?General stair comments: Pt reports no ability to perform posterior asc with RW at home due to inadequate space to trurn around. Pt reports able to asc with RW and use door froame with SUE and other UE on RW in past and was successful. Pt able to perfrom this technique inside room successfully without LOB or being falls risk with adequate strength to perform. ? ? ?Wheelchair Mobility ?  ? ?Modified Rankin (Stroke Patients Only) ?  ? ? ?  ?Balance Overall balance assessment: Needs assistance ?Sitting-balance support: No upper extremity supported, Feet supported ?Sitting balance-Leahy Scale: Normal ?  ?  ?Standing balance support: Bilateral upper extremity supported ?Standing balance-Leahy Scale: Fair ?Standing balance comment: Reliant on AD due to pain in LLE ?  ?  ?  ?  ?  ?  ?  ?  ?  ?  ?  ?  ? ?  ?Cognition Arousal/Alertness: Awake/alert ?Behavior During Therapy: [facility] for tasks assessed/performed ?Overall Cognitive Status: Within Functional Limits for tasks assessed ?  ?  ?  ?  ?  ?  ?  ?  ?  ?  ?  ?  ?  ?  ?  ?  ?  ?  ?  ? ?  ?Exercises Other Exercises ?Other Exercises: Education on LE sequencing with asc/desc stairs ? ?  ?General Comments   ?  ?  ? ?Pertinent Vitals/Pain Pain Assessment ?Pain Assessment: 0-10 ?Pain Score: 6  ?Pain Location: L foot in dependent positions ?Pain Descriptors / Indicators: Dull, Grimacing, Guarding, Pounding, Moaning ?Pain Intervention(s): Limited activity within patient's tolerance, Monitored during session, Premedicated before session, Repositioned  ? ? ?Home Living   ?  ?  ?  ?  ?  ?  ?  ?  ?  ?   ?  ?Prior Function    ?  ?  ?   ? ?PT Goals (current goals can now be found in the care plan section) Acute Rehab PT Goals ?Patient Stated Goal: To go home, improve mobility ?PT Goal Formulation: With patient ?Time For Goal  Achievement: 11/03/21 ?Potential to Achieve Goals: Good ?Progress towards PT goals: Progressing toward goals ? ?  ?Frequency ? ? ? 7X/week ? ? ? ?  ?PT Plan Current plan remains appropriate  ? ? ?Co-evaluation   ?  ?  ?  ?  ? ?  ?AM-PAC PT "6 Clicks" Mobility   ?Outcome Measure ? Help needed turning from your back to your side while in a flat bed without using bedrails?: A Little ?Help needed moving from lying on your back to sitting on the side of a flat bed without using bedrails?: A Little ?Help needed moving to and from a bed to a chair (including a wheelchair)?: A Little ?Help needed standing up from a chair using your arms (e.g., wheelchair or bedside chair)?: A Little ?Help needed to walk in hospital room?: A Lot ?Help needed climbing 3-5 steps with a railing? : A Lot ?6 Click Score: 16 ? ?  ?End  of Session Equipment Utilized During Treatment: Gait belt ?Activity Tolerance: Patient tolerated treatment well ?Patient left: in chair;with call bell/phone within reach;with nursing/sitter in room ?Nurse Communication: Mobility status ?PT Visit Diagnosis: Other abnormalities of gait and mobility (R26.89);Difficulty in walking, not elsewhere classified (R26.2);Pain ?Pain - Right/Left: Left ?Pain - part of body: Leg ?  ? ? ?Time: 2409-7353 ?PT Time Calculation (min) (ACUTE ONLY): 14 min ? ?Charges:  $Gait Training: 8-22 mins          ?          ? ?Delphia Grates. Fairly IV, PT, DPT ?Physical Therapist- [place]  ?[facility]  ?10/21/2021, 10:43 AM ? ?

## 2021-10-22 DIAGNOSIS — R7989 Other specified abnormal findings of blood chemistry: Secondary | ICD-10-CM | POA: Diagnosis not present

## 2021-10-22 DIAGNOSIS — A46 Erysipelas: Secondary | ICD-10-CM | POA: Diagnosis not present

## 2021-10-22 DIAGNOSIS — I10 Essential (primary) hypertension: Secondary | ICD-10-CM | POA: Diagnosis not present

## 2021-10-22 LAB — BASIC METABOLIC PANEL
Anion gap: 3 — ABNORMAL LOW (ref 5–15)
BUN: 19 mg/dL (ref 6–20)
CO2: 25 mmol/L (ref 22–32)
Calcium: 8.3 mg/dL — ABNORMAL LOW (ref 8.9–10.3)
Chloride: 105 mmol/L (ref 98–111)
Creatinine, Ser: 0.85 mg/dL (ref 0.61–1.24)
GFR, Estimated: >60 mL/min (ref >60)
Glucose, Bld: 172 mg/dL — ABNORMAL HIGH (ref 70–99)
Potassium: 4.1 mmol/L (ref 3.5–5.1)
Sodium: 133 mmol/L — ABNORMAL LOW (ref 135–145)

## 2021-10-22 LAB — CULTURE, BLOOD (ROUTINE X 2)
Culture: NO GROWTH
Culture: NO GROWTH
Special Requests: ADEQUATE

## 2021-10-22 LAB — CBC
HCT: 38.9 % — ABNORMAL LOW (ref 39.0–52.0)
Hemoglobin: 13.3 g/dL (ref 13.0–17.0)
MCH: 30.6 pg (ref 26.0–34.0)
MCHC: 34.2 g/dL (ref 30.0–36.0)
MCV: 89.6 fL (ref 80.0–100.0)
Platelets: 281 10*3/uL (ref 150–400)
RBC: 4.34 MIL/uL (ref 4.22–5.81)
RDW: 12.9 % (ref 11.5–15.5)
WBC: 8.8 10*3/uL (ref 4.0–10.5)
nRBC: 0 % (ref 0.0–0.2)

## 2021-10-22 LAB — GLUCOSE, CAPILLARY
Glucose-Capillary: 151 mg/dL — ABNORMAL HIGH (ref 70–99)
Glucose-Capillary: 179 mg/dL — ABNORMAL HIGH (ref 70–99)
Glucose-Capillary: 172 mg/dL — ABNORMAL HIGH (ref 70–99)
Glucose-Capillary: 185 mg/dL — ABNORMAL HIGH (ref 70–99)

## 2021-10-22 MED ORDER — OXYCODONE HCL 5 MG PO TABS: 5.0000 mg | ORAL_TABLET | ORAL | Status: DC | PRN

## 2021-10-22 MED ORDER — TRAMADOL HCL 50 MG PO TABS
50.0000 mg | ORAL_TABLET | Freq: Four times a day (QID) | ORAL | Status: DC | PRN
  Administered 2021-10-22 – 2021-10-23 (×2): 50 mg via ORAL
  Filled 2021-10-22 (×2): qty 1

## 2021-10-22 NOTE — Plan of Care (Signed)

## 2021-10-22 NOTE — Plan of Care (Signed)

## 2021-10-22 NOTE — Assessment & Plan Note (Signed)
Meets criteria with BMI > 35 plus co-morbidities of HTN and diabetes.  Counseled for weight loss ?

## 2021-10-22 NOTE — Consult Note (Signed)
?[facility] ?Vascular Consult Note ? ?MRN : [id number] ? ?Jeffrey [name] is a 61 y.o. ([birth year]) male who presents with chief complaint of  ?Chief Complaint  ?Patient presents with  ? Cellulitis  ?. ? ? ?Consulting Physician: Max Sane, MD ?Reason for consult: Left lower extremity discoloration ?History of Present Illness: Jeffrey [name] is a 61 year old male who presented to [facility] due to severe erysipelas/cellulitis that progressed over the course of several weeks and extended from his groin to his foot.  The infection is much more under control and the swelling is improved however it was noted that with working with physical therapy the patient's foot becomes discolored within several minutes of being placed in a dependent position.  The discoloration always resolves with elevation.  The patient does have some difficulty and pain with ambulation but not consistent with claudication-like symptoms.  This pain is actually much improved from when he initially presented as he was barely able to stand any pressure on his leg at all.  Previous DVT study was also negative.  The patient has had recurrent episodes of cellulitis in the past however this has been the worst recurrence, with the last episode being in 2017. ? ?Current Facility-Administered Medications  ?Medication Dose Route Frequency Provider Last Rate Last Admin  ? 0.9 %  sodium chloride infusion   Intravenous PRN Annita Brod, MD 10 mL/hr at 10/21/21 2158 New Bag at 10/21/21 2158  ? acetaminophen (TYLENOL) tablet 650 mg  650 mg Oral Q6H PRN Ivor Costa, MD   650 mg at 10/18/21 1703  ? albuterol (PROVENTIL) (2.5 MG/3ML) 0.083% nebulizer solution 3 mL  3 mL Inhalation Q4H PRN Ivor Costa, MD      ? atorvastatin (LIPITOR) tablet 10 mg  10 mg Oral Daily Ivor Costa, MD   10 mg at 10/22/21 0820  ? ceFAZolin (ANCEF) IVPB 2g/100 mL premix  2 g Intravenous Q8H Beers, Shanon Brow, RPH 200 mL/hr at 10/22/21  1344 2 g at 10/22/21 1344  ? enoxaparin (LOVENOX) injection 60 mg  0.5 mg/kg Subcutaneous Q24H Ivor Costa, MD   60 mg at 10/21/21 1525  ? hydrALAZINE (APRESOLINE) injection 5 mg  5 mg Intravenous Q2H PRN Ivor Costa, MD      ? ibuprofen (ADVIL) tablet 200 mg  200 mg Oral Q6H PRN Ivor Costa, MD   200 mg at 10/19/21 1246  ? insulin aspart (novoLOG) injection 0-5 Units  0-5 Units Subcutaneous QHS Ivor Costa, MD      ? insulin aspart (novoLOG) injection 0-9 Units  0-9 Units Subcutaneous TID WC Ivor Costa, MD   2 Units at 10/22/21 1217  ? ketorolac (TORADOL) 30 MG/ML injection 30 mg  30 mg Intravenous Q8H Annita Brod, MD   30 mg at 10/22/21 1344  ? lisinopril (ZESTRIL) tablet 20 mg  20 mg Oral Daily Ivor Costa, MD   20 mg at 10/22/21 0820  ? morphine (PF) 2 MG/ML injection 2 mg  2 mg Intravenous Q3H PRN Annita Brod, MD   2 mg at 10/20/21 1014  ? nicotine (NICODERM CQ - dosed in mg/24 hours) patch 21 mg  21 mg Transdermal Daily Ivor Costa, MD      ? ondansetron [facility]) injection 4 mg  4 mg Intravenous Q8H PRN Ivor Costa, MD      ? oxyCODONE (Oxy IR/ROXICODONE) immediate release tablet 5 mg  5 mg Oral Q4H PRN Ivor Costa, MD   5 mg at  10/22/21 0820  ? pantoprazole (PROTONIX) EC tablet 40 mg  40 mg Oral Daily Ivor Costa, MD   40 mg at 10/22/21 0820  ? ? ?Past Medical History:  ?Diagnosis Date  ? Depression   ? Diabetes mellitus without complication ([place])   ? GERD (gastroesophageal reflux disease)   ? Herniated disc   ? HLD (hyperlipidemia)   ? Hypertension   ? Morbid obesity ([place]) 10/18/2021  ? Sleep apnea uses c-pap  ? Streptococcal cellulitis   ? Wears glasses   ? ? ?Past Surgical History:  ?Procedure Laterality Date  ? ANKLE SURGERY    ? left fx  ? APPENDECTOMY    ? CARPAL TUNNEL RELEASE Right 10/05/2014  ? Procedure: RIGHT CARPAL TUNNEL RELEASE;  Surgeon: Roseanne Kaufman, MD;  Location: [facility];  Service: Orthopedics;  Laterality: Right;  ? COLONOSCOPY    ? LUMBAR LAMINECTOMY/DECOMPRESSION  MICRODISCECTOMY  07/23/2011  ? Procedure: LUMBAR LAMINECTOMY/DECOMPRESSION MICRODISCECTOMY;  Surgeon: Tobi Bastos, MD;  Location: [facility];  Service: Orthopedics;  Laterality: Right;  ? ULNAR TUNNEL RELEASE Right 10/05/2014  ? Procedure: RIGHT CUBITAL TUNNEL RELEASE;  Surgeon: Roseanne Kaufman, MD;  Location: [facility];  Service: Orthopedics;  Laterality: Right;  ? UMBILICAL HERNIA REPAIR N/A 10/20/2014  ? Procedure: HERNIA REPAIR UMBILICAL ADULT;  Surgeon: Rochel Brome, MD;  Location: [facility];  Service: General;  Laterality: N/A;  ? VASECTOMY    ? ? ?Social History ?Social History  ? ?Tobacco Use  ? Smoking status: Every Day  ?  Packs/day: 0.25  ?  Years: 35.00  ?  Pack years: 8.75  ?  Types: Cigarettes  ? Smokeless tobacco: Never  ?Vaping Use  ? Vaping Use: Never used  ?Substance Use Topics  ? Alcohol use: Not Currently  ?  Alcohol/week: 2.0 - 3.0 standard drinks  ?  Types: 2 Cans of beer per week  ?  Comment: rare  ? Drug use: No  ? ? ?Family History ?Family History  ?Problem Relation Age of Onset  ? Diabetes Mother   ? Hypertension Father   ? ? ?Allergies  ?Allergen Reactions  ? Penicillin G Anaphylaxis  ? Penicillins Anaphylaxis  ? Bupropion Other (See Comments)  ?  Mental status change.  ? Escitalopram Oxalate Other (See Comments)  ?  Delayed ejaculation.  ? ? ? ?REVIEW OF SYSTEMS (Negative unless checked) ? ?Constitutional: [] Weight loss  [] Fever  [] Chills ?Cardiac: [] Chest pain   [] Chest pressure   [] Palpitations   [] Shortness of breath when laying flat   [] Shortness of breath at rest   [] Shortness of breath with exertion. ?Vascular:  [] Pain in legs with walking   [] Pain in legs at rest   [] Pain in legs when laying flat   [] Claudication   [] Pain in feet when walking  [] Pain in feet at rest  [] Pain in feet when laying flat   [] History of DVT   [] Phlebitis   [x] Swelling in legs   [] Varicose veins   [] Non-healing ulcers ?Pulmonary:   [] Uses home oxygen   [] Productive cough   [] Hemoptysis   [] Wheeze   [] COPD   [] Asthma ?Neurologic:  [] Dizziness  [] Blackouts   [] Seizures   [] History of stroke   [] History of TIA  [] Aphasia   [] Temporary blindness   [] Dysphagia   [] Weakness or numbness in arms   [] Weakness or numbness in legs ?Musculoskeletal:  [] Arthritis   [] Joint swelling   [] Joint pain   [] Low back pain ?Hematologic:  [x] Easy bruising  [] Easy  bleeding   [] Hypercoagulable state   [] Anemic  [] Hepatitis ?Gastrointestinal:  [] Blood in stool   [] Vomiting blood  [] Gastroesophageal reflux/heartburn   [] Difficulty swallowing. ?Genitourinary:  [] Chronic kidney disease   [] Difficult urination  [] Frequent urination  [] Burning with urination   [] Blood in urine ?Skin:  [] Rashes   [] Ulcers   [] Wounds ?Psychological:  [] History of anxiety   []  History of major depression. ? ?Physical Examination ? ?Vitals:  ? 10/21/21 1605 10/21/21 2008 10/22/21 [id number] 10/22/21 0728  ?BP: 110/62 108/69 (!) 113/56 (!) 104/57  ?Pulse: 79 77 65 68  ?Resp: 18 20 19 18   ?Temp: 99 ?F (37.2 ?C) 98 ?F (36.7 ?C)  98.2 ?F (36.8 ?C)  ?TempSrc:  Oral    ?SpO2: 95% 97% 97% 99%  ?Weight:      ?Height:      ? ?Body mass index is 38.31 kg/m?. ?Gen:  WD/WN, NAD ?Head: [place]/AT, No temporalis wasting. Prominent temp pulse not noted. ?Ear/Nose/Throat: Hearing grossly intact, nares w/o erythema or drainage, oropharynx w/o Erythema/Exudate ?Eyes: Sclera non-icteric, conjunctiva clear ?Neck: Trachea midline.  No JVD.  ?Pulmonary:  Good air movement, respirations not labored, equal bilaterally.  ?Cardiac: RRR, normal S1, S2. ?Vascular: Bilateral feet warm ?Vessel Right Left  ?PT Palpable Palpable  ?DP Palpable Palpable  ? ?Gastrointestinal: soft, non-tender/non-distended. No guarding/reflex.  ?Musculoskeletal: M/S 5/5 throughout.  Extremities without ischemic changes.  No deformity or atrophy.  1+ edema left lower extremity ?Neurologic: Sensation grossly intact in extremities.  Symmetrical.  Speech is fluent. Motor exam as listed above. ?Psychiatric: Judgment intact,  Mood & affect appropriate for pt's clinical situation. ?Dermatologic: No rashes or ulcers noted.  No cellulitis or open wounds. ?Lymph : No Cervical, Axillary, or Inguinal lymphadenopathy. ? ? ? ?CBC ?Lab

## 2021-10-22 NOTE — Assessment & Plan Note (Signed)
Continue lisinopril

## 2021-10-22 NOTE — Assessment & Plan Note (Signed)
-  Continue Lipitor °

## 2021-10-22 NOTE — Progress Notes (Signed)
?  Progress Note ? ? ?Patient: Jeffrey [name] VXY:[id number] DOB: 07/10/1960 DOA: 10/17/2021     5 ?DOS: the patient was seen and examined on 10/22/2021 ?  ?Brief hospital course: ?61 year old male with past medical history of recurrent strep cellulitis, obstructive sleep apnea, hypertension, diabetes and morbid obesity who presented to the emergency room on 5/5 with left leg pain, erythema and swelling x5 days and found to have a left leg cellulitis with possible erysipelas and lymphangitis.  Patient was admitted to the hospitalist service with infectious disease consultation and started on IV Ancef and clindamycin.  Sepsis was ruled out.  Cellulitis although very dense has been slowly responding to IV antibiotics. ? ?5/9: On IV cefazolin and clindamycin with slow improvement.  ID following ?5/10: Vascular surgery consult.  Clindamycin stopped ? ? ?Assessment and Plan: ?* Erysipelas of left lower extremity ?Cellulitis/erysipelas/ lymphagitis per Dr. Rivka Safer of ID. Sepsis ruled out. ? ?Lower extremity Doppler has ruled out DVT. clindamycin stopped.  Continue IV cefazolin per ID.  ID to decide antibiotic at discharge ? ?Abnormal LFTs ?Unclear etiology although appears to be improving.  Possibly from shock liver from hypotension.  Repeat labs shows improvement ? ?Type II diabetes mellitus with renal manifestations (HCC) ?Recent A1c 6.6, well controlled.  Patient taking Invokana at home ?-Continue sliding scale insulin while in the hospital. ? ?Sleep apnea ?- Continue CPAP. ? ?Essential hypertension ?- Continue lisinopril ? ?HLD (hyperlipidemia) ?- Continue Lipitor. ? ?Morbid obesity (HCC) ?Meets criteria with BMI > 35 plus co-morbidities of HTN and diabetes.  Counseled for weight loss. ? ? ? ? ?  ? ?Subjective: Pain much better control, lower extremity erythema improving.  Wife at bedside ? ?Physical Exam: ?Vitals:  ? 10/21/21 2008 10/22/21 0443 10/22/21 0728 10/22/21 1500  ?BP: 108/69 (!) 113/56 (!) 104/57 104/67   ?Pulse: 77 65 68 68  ?Resp: 20 19 18 18   ?Temp: 98 ?F (36.7 ?C)  98.2 ?F (36.8 ?C) 98.6 ?F (37 ?C)  ?TempSrc: Oral     ?SpO2: 97% 97% 99% 97%  ?Weight:      ?Height:      ? ?? General:?Alert and oriented x3, no acute distress ?? HEENT: Normocephalic and atraumatic, mucous membranes are moist ?? Cardiovascular:?Regular rate and rhythm, S1-S2 ?? Respiratory:?Clear to auscultation bilaterally ?? Abdomen:?Soft, nontender, nondistended, positive bowel sounds ?? Skin: See the pictures ?? Psychiatry:?Appropriate, no evidence of psychoses ?? Neurology:?No focal deficits ? ? ? ? ? ? ?Data Reviewed: ? ?Sodium 133, WBC 8.8 ? ?Family Communication: Wife at bedside updated ? ?Disposition: ?Status is: Inpatient ?Remains inpatient appropriate because: Getting IV antibiotic for management of erysipelas ? ? Planned Discharge Destination: Home ? ? ? DVT prophylaxis-Lovenox ?Time spent: 35 minutes ? ?Author: ? , MD ?10/22/2021 3:37 PM ? ?For on call review [email or web address].  ?

## 2021-10-22 NOTE — Progress Notes (Signed)
Physical Therapy Treatment ?Patient Details ?Name: Jeffrey [name] ?MRN: [id number] ?DOB: 09-29-1960 ?Today's Date: 10/22/2021 ? ? ?History of Present Illness Per EMR MD note on 10/17/21: Jeffrey [name] is a 61 y.o. male with past medical history of hypertension and diabetes who presents to the ED complaining of left leg swelling and pain.  Patient reports that he first developed pain and swelling in the lower part of his left leg about 5 days ago, was seen in the [facility], ED at that time and admitted for cellulitis which was treated with IV Rocephin.  Patient reports that he had initial improvement in the redness and was discharged home on cefadroxil.  He has been taking this consistently and followed up with infectious disease yesterday, at which time he noted increasing redness and swelling that was tracking up his leg.  He additionally has started to notice redness along his left medial thigh in a lymphatic distribution.  He was referred to the ED for further evaluation along with IV antibiotics by infectious disease.  Patient states he has been having fevers as high as 102, including earlier today.  He has not had any Tylenol or ibuprofen today.  He has dealt with recurrent infections in this leg previously, denies any recent trauma. ? ?  ?PT Comments  ? ? Pt was long sitting in bed with BUE elevated. He reports being given pain meds ~ 1/2 hour earlier. " Its much better today than yesterday." Pt was easily and safely able to exit bed, stand and ambulate without physical assistance however with use of RW.His LLE quickly becomes purple/ darker when not elevated. Limited gait to shorter distance due to concerns with circulation. Once pt returned to room and elevated LLE the color subsides and returns to prior to OOB activity. Lengthy discussion on importance of keeping elevated for wound healing. Acute PT will continue to follow and progress as able per current POC. ?  ?Recommendations for follow up  therapy are one component of a multi-disciplinary discharge planning process, led by the attending physician.  Recommendations may be updated based on patient status, additional functional criteria and insurance authorization. ? ?Follow Up Recommendations ? Home health PT ?  ?  ?Assistance Recommended at Discharge Frequent or constant Supervision/Assistance  ?Patient can return home with the following A little help with walking and/or transfers;Assist for transportation;Help with stairs or ramp for entrance ?  ?Equipment Recommendations ? None recommended by PT  ?  ?   ?Precautions / Restrictions Precautions ?Precautions: Fall ?Restrictions ?Weight Bearing Restrictions: No  ?  ? ?Mobility ? Bed Mobility ?Overal bed mobility: Modified Independent ?  ?Transfers ?Overall transfer level: Modified independent ?Equipment used: Rolling walker (2 wheels) ?Transfers: Sit to/from Stand ?Sit to Stand: Supervision ?  ?   ?General transfer comment: pt easily and safely able to stand and ambulate ?  ? ?Ambulation/Gait ?Ambulation/Gait assistance: Supervision ?Gait Distance (Feet): 80 Feet ?Assistive device: Rolling walker (2 wheels) ?Gait Pattern/deviations: Step-through pattern ?Gait velocity: decreased ?  ?  ?General Gait Details: pt reports "much less pain today." Pt's LE does get purple with leg placed in dependent positions. Recommend short gait distances only ?  ?Balance Overall balance assessment: Needs assistance ?Sitting-balance support: No upper extremity supported, Feet supported ?Sitting balance-Leahy Scale: Normal ?  ?  ?Standing balance support: Bilateral upper extremity supported ?Standing balance-Leahy Scale: Good ?Standing balance comment: Reliant on AD due to pain in LLE ?  ?  ?  ?Cognition Arousal/Alertness: Awake/alert ?Behavior During  Therapy: WFL for tasks assessed/performed ?Overall Cognitive Status: Within Functional Limits for tasks assessed ?  ?   ?General Comments: Pt is A and O x 4 ?  ?  ? ?  ?   ?    ? ?Pertinent Vitals/Pain Pain Assessment ?Pain Assessment: 0-10 ?Pain Score: 4  ?Faces Pain Scale: Hurts a little bit ?Pain Location: L foot in dependent positions ?Pain Descriptors / Indicators: Dull, Grimacing, Guarding, Pounding, Moaning ?Pain Intervention(s): Limited activity within patient's tolerance, Monitored during session, Premedicated before session, Repositioned  ? ? ? ?PT Goals (current goals can now be found in the care plan section) Acute Rehab PT Goals ?Patient Stated Goal: To go home, improve mobility ?Progress towards PT goals: Progressing toward goals ? ?  ?Frequency ? ? ? 7X/week ? ? ? ?  ?PT Plan Current plan remains appropriate  ? ? ?   ?AM-PAC PT "6 Clicks" Mobility   ?Outcome Measure ? Help needed turning from your back to your side while in a flat bed without using bedrails?: A Little ?Help needed moving from lying on your back to sitting on the side of a flat bed without using bedrails?: A Little ?Help needed moving to and from a bed to a chair (including a wheelchair)?: A Little ?Help needed standing up from a chair using your arms (e.g., wheelchair or bedside chair)?: A Little ?Help needed to walk in hospital room?: A Little ?Help needed climbing 3-5 steps with a railing? : A Little ?6 Click Score: 18 ? ?  ?End of Session Equipment Utilized During Treatment: Gait belt ?Activity Tolerance: Patient tolerated treatment well ?Patient left: in chair;with call bell/phone within reach;with nursing/sitter in room ?Nurse Communication: Mobility status ?PT Visit Diagnosis: Other abnormalities of gait and mobility (R26.89);Difficulty in walking, not elsewhere classified (R26.2);Pain ?Pain - Right/Left: Left ?Pain - part of body: Leg ?  ? ? ?Time: [phone] ?PT Time Calculation (min) (ACUTE ONLY): 18 min ? ?Charges:  $Gait Training: 8-22 mins          ?          ?Julaine Fusi PTA ?10/22/21, 9:27 AM  ? ?

## 2021-10-22 NOTE — Assessment & Plan Note (Signed)
Continue CPAP.  

## 2021-10-22 NOTE — Assessment & Plan Note (Signed)
Cellulitis/erysipelas/ lymphagitis per Dr. Rivka Safer of ID. Sepsis ruled out. ? ?Lower extremity Doppler has ruled out DVT. clindamycin stopped.  Continue IV cefazolin per ID.  ID to decide antibiotic at discharge ?

## 2021-10-22 NOTE — Progress Notes (Signed)
ID ?Wife at bedside ?Patient is doing better ?Walked with PT in the room and did some exercise, leg turns very dusky on being dependent ?Has less pain in the left leg especially due to taking Toradol ? ?O/e ?Looks well ?Patient Vitals for the past 24 hrs: ? BP Temp Temp src Pulse Resp SpO2  ?10/22/21 0728 (!) 104/57 98.2 ?F (36.8 ?C) -- 68 18 99 %  ?10/22/21 0443 (!) 113/56 -- -- 65 19 97 %  ?10/21/21 2008 108/69 98 ?F (36.7 ?C) Oral 77 20 97 %  ?10/21/21 1605 110/62 99 ?F (37.2 ?C) -- 79 18 95 %  ? Chest CTA ?Hss1s2 ?Abd soft ?CNS non focal ?Left leg ?Superficial darkening and petechiae of the skin ?Edema better ?Erythema better ?Some tenderness present ?10/20/21 ? ? ?10/20/21 ? ? ?10/17/21 ? ? ?Labs ? ?  Latest Ref Rng & Units 10/22/2021  ?  4:27 AM 10/20/2021  ?  4:12 AM 10/18/2021  ?  9:25 AM  ?CBC  ?WBC 4.0 - 10.5 K/uL 8.8   10.4   10.0    ?Hemoglobin 13.0 - 17.0 g/dL 13.3   13.9   14.2    ?Hematocrit 39.0 - 52.0 % 38.9   41.3   42.0    ?Platelets 150 - 400 K/uL 281   268   189    ?  ? ?  Latest Ref Rng & Units 10/22/2021  ?  4:27 AM 10/20/2021  ?  4:12 AM 10/18/2021  ?  9:25 AM  ?CMP  ?Glucose 70 - 99 mg/dL 172   140   205    ?BUN 6 - 20 mg/dL 19   15   14     ?Creatinine 0.61 - 1.24 mg/dL 0.85   0.79   0.84    ?Sodium 135 - 145 mmol/L 133   136   133    ?Potassium 3.5 - 5.1 mmol/L 4.1   4.0   3.9    ?Chloride 98 - 111 mmol/L 105   104   103    ?CO2 22 - 32 mmol/L 25   25   22     ?Calcium 8.9 - 10.3 mg/dL 8.3   8.2   8.5    ?Total Protein 6.5 - 8.1 g/dL  6.3   6.7    ?Total Bilirubin 0.3 - 1.2 mg/dL  0.7   1.0    ?Alkaline Phos 38 - 126 U/L  98   116    ?AST 15 - 41 U/L  43   52    ?ALT 0 - 44 U/L  68   88    ? Micro ?[facility] 10/17/21- no growth ? ? ?Impression/recommendation ?Severe erysipelas/cellulitis/lymphangitis of the left leg likely due to Group A strep ?Currently on cefazolin . clindamycin  stopped last night after 5 days.  steady improvement.  Has severe darkening of skin with superficial petechiae ?Pt should come of IV  toradol to assess pain  ? On discharge may do Linezolid instead of cefadroxil ? ?DM on invokana ? ?Discussed the management with patient and his wife and hospitalist ?

## 2021-10-22 NOTE — Consult Note (Signed)
? ?  [facility] CM Inpatient Consult ? ? ?10/22/2021 ? ?Duncan Dull ?[birth year] ?[id number] ? ? [place] Patient: [place] [CHEP] ? ?*Patient admitted to [facility] [[facility]] telephonic review ? ?Primary Care Provider:  Derinda Late, MD, at [facility] ? ? ?Patient had recently been outreached by a  [place] Management for telephonic chronic disease management services.and declined ongoing services for community follow up needs. ? ?Referral for [facility] [[facility]HEP] plan for readmission less than 7 days review for post hospital support with ongoing cellulitis with ongoing IV antibiotic needs and assess for needs from the [CHEP]. ?1438:  Attempted call to patient without success as patient is at [facility] to follow up on any needs. ?   ? Plan: Patient will be followed by [place] Coordinator. ?  ?For additional questions or referrals please contact: ?  ?Natividad Brood, RN BSN CCM ?[facility] Liaison ? [phone] business mobile phone ?Toll free office [phone]  ?Fax number: [phone] ?[email or web address] ? ? ?

## 2021-10-22 NOTE — Assessment & Plan Note (Signed)
Recent A1c 6.6, well controlled.  Patient taking Invokana at home ?-Continue sliding scale insulin while in the hospital. ?

## 2021-10-22 NOTE — Assessment & Plan Note (Signed)
Unclear etiology although appears to be improving.  Possibly from shock liver from hypotension.  Repeat labs shows improvement ?

## 2021-10-23 ENCOUNTER — Other Ambulatory Visit (HOSPITAL_COMMUNITY): Payer: Self-pay

## 2021-10-23 ENCOUNTER — Other Ambulatory Visit: Payer: Self-pay

## 2021-10-23 DIAGNOSIS — I10 Essential (primary) hypertension: Secondary | ICD-10-CM | POA: Diagnosis not present

## 2021-10-23 DIAGNOSIS — L03116 Cellulitis of left lower limb: Secondary | ICD-10-CM | POA: Diagnosis not present

## 2021-10-23 DIAGNOSIS — R7989 Other specified abnormal findings of blood chemistry: Secondary | ICD-10-CM | POA: Diagnosis not present

## 2021-10-23 DIAGNOSIS — A46 Erysipelas: Secondary | ICD-10-CM | POA: Diagnosis not present

## 2021-10-23 LAB — GLUCOSE, CAPILLARY: Glucose-Capillary: 160 mg/dL — ABNORMAL HIGH (ref 70–99)

## 2021-10-23 MED ORDER — TRAMADOL HCL 50 MG PO TABS
50.0000 mg | ORAL_TABLET | Freq: Four times a day (QID) | ORAL | 0 refills | Status: DC | PRN
  Filled 2021-10-23: qty 12, 3d supply, fill #0

## 2021-10-23 MED ORDER — LINEZOLID 600 MG PO TABS
600.0000 mg | ORAL_TABLET | Freq: Two times a day (BID) | ORAL | 0 refills | Status: AC
  Filled 2021-10-23: qty 14, 7d supply, fill #0

## 2021-10-23 MED ORDER — HYDROCODONE-ACETAMINOPHEN 5-325 MG PO TABS
1.0000 | ORAL_TABLET | Freq: Four times a day (QID) | ORAL | 0 refills | Status: AC | PRN
  Filled 2021-10-23: qty 12, 3d supply, fill #0

## 2021-10-23 NOTE — TOC Benefit Eligibility Note (Signed)
Patient Advocate Encounter ? ?Insurance verification completed.   ? ?The patient is currently admitted and upon discharge could be taking linezolid (Zyvox) 600 mg tablets. ? ?The current 14 day co-pay is, $5.00.  ? ?The patient is insured through [employer]  ? ? ? ?Lyndel Safe, CPhT ?Pharmacy Patient Advocate Specialist ?[place] Patient Advocate Team ?Direct Number: [phone]  Fax: [phone] ? ? ? ? ? ?  ?

## 2021-10-23 NOTE — Plan of Care (Signed)
?  Problem: Education: ?Goal: Knowledge of General Education information will improve ?Description: Including pain rating scale, medication(s)/side effects and non-pharmacologic comfort measures ?10/23/2021 0849 by Evelena Peat, RN ?Outcome: Completed/Met ?10/23/2021 0831 by Evelena Peat, RN ?Outcome: Progressing ?  ?Problem: Health Behavior/Discharge Planning: ?Goal: Ability to manage health-related needs will improve ?10/23/2021 0849 by Evelena Peat, RN ?Outcome: Completed/Met ?10/23/2021 0831 by Evelena Peat, RN ?Outcome: Progressing ?  ?Problem: Clinical Measurements: ?Goal: Ability to maintain clinical measurements within normal limits will improve ?10/23/2021 0849 by Evelena Peat, RN ?Outcome: Completed/Met ?10/23/2021 0831 by Evelena Peat, RN ?Outcome: Progressing ?Goal: Will remain free from infection ?10/23/2021 0849 by Evelena Peat, RN ?Outcome: Completed/Met ?10/23/2021 0831 by Evelena Peat, RN ?Outcome: Progressing ?Goal: Diagnostic test results will improve ?10/23/2021 0849 by Evelena Peat, RN ?Outcome: Completed/Met ?10/23/2021 0831 by Evelena Peat, RN ?Outcome: Progressing ?Goal: Respiratory complications will improve ?10/23/2021 0849 by Evelena Peat, RN ?Outcome: Completed/Met ?10/23/2021 0831 by Evelena Peat, RN ?Outcome: Progressing ?Goal: Cardiovascular complication will be avoided ?10/23/2021 0849 by Evelena Peat, RN ?Outcome: Completed/Met ?10/23/2021 0831 by Evelena Peat, RN ?Outcome: Progressing ?  ?

## 2021-10-23 NOTE — Plan of Care (Signed)

## 2021-10-23 NOTE — TOC Progression Note (Signed)
Transition of Care (TOC) - Progression Note  ? ? ?Patient Details  ?Name: Jeffrey [name] ?MRN: [id number] ?Date of Birth: 05-21-1961 ? ?Transition of Care (TOC) CM/SW Contact  ?Taimi Towe A Emmalise Huard, LCSW ?Phone Number: ?10/23/2021, 9:46 AM ? ?Clinical Narrative:   CSW spoke with pt and he is declining HH stating he has gotten better and he doesn't think he needs it. He is very appreciative of the care. NO additional needs noted.  ? ? ? ?  ?  ? ?Expected Discharge Plan and Services ?  ?  ?  ?  ?  ?Expected Discharge Date: 10/23/21               ?  ?  ?  ?  ?  ?  ?  ?  ?  ?  ? ? ?Social Determinants of Health (SDOH) Interventions ?  ? ?Readmission Risk Interventions ?   ? View : No data to display.  ?  ?  ?  ? ? ?

## 2021-10-23 NOTE — Progress Notes (Signed)
Patient discharged to home with all belongings. AVS and medications reviewed with patient and family. PIVX1 removed with cathter intact. No questions or concerns to follow-up. ?

## 2021-10-24 ENCOUNTER — Telehealth: Payer: Self-pay

## 2021-10-24 NOTE — Telephone Encounter (Signed)
Patient's wife called stating patient is having a lot of pain since hospital discharge and it is extremely painful. Patient is asking  if additional pain medication could be sent in for the patient. I have advised her to reach out to the patient's pcp to see if they can send in pain medication for the patient.  ?Jeffrey [name] T Maddilynn Esperanza ? ?

## 2021-10-24 NOTE — Telephone Encounter (Signed)
Patient's wife advised to have the patient try Advil or Ibuprofen for the pain.  ?

## 2021-10-26 NOTE — Discharge Summary (Signed)
?Physician Discharge Summary ?  ?Patient: Jeffrey [name] MRN: [id number] DOB: 05-Dec-1960  ?Admit date:     10/17/2021  ?Discharge date: 10/23/2021  ?Discharge Physician: Max Sane  ? ?PCP: Derinda Late, MD  ? ?Recommendations at discharge:  ? ?Follow-up with outpatient providers as requested ? ?Discharge Diagnoses: ?Principal Problem: ?  Erysipelas of left lower extremity ?Active Problems: ?  Abnormal LFTs ?  Type II diabetes mellitus with renal manifestations ([place]) ?  Sleep apnea ?  Essential hypertension ?  HLD (hyperlipidemia) ?  Morbid obesity ([place]) ?  Left leg cellulitis ? ?Hospital Course: ?61 year old male with past medical history of recurrent strep cellulitis, obstructive sleep apnea, hypertension, diabetes and morbid obesity who presented to the emergency room on 5/5 with left leg pain, erythema and swelling x5 days and found to have a left leg cellulitis with possible erysipelas and lymphangitis.  Patient was admitted to the hospitalist service with infectious disease consultation and started on IV Ancef and clindamycin.  Sepsis was ruled out.  Cellulitis although very dense has been slowly responding to IV antibiotics. ? ?5/9: On IV cefazolin and clindamycin with slow improvement.  ID following ?5/10: Vascular surgery consult.  Clindamycin stopped ? ?Assessment and Plan: ?*Cellulitis/erysipelas/lymphangitis of left lower extremity ?Present on admission, sepsis ruled out. ?Lower extremity Doppler has ruled out DVT.  Responded well to IV cefazolin and clindamycin.  After discussion with ID patient is being discharged on oral Zyvox.  Patient in agreement ? ?Abnormal LFTs ?Possibly from shock liver from hypotension.  Improving  ? ?Type II diabetes mellitus with renal manifestations ([place]) ?Recent A1c 6.6, well controlled.  Resume home regimen at discharge ? ?Sleep apnea ?- Continue CPAP. ? ?Essential hypertension ?- Continue lisinopril ? ?HLD (hyperlipidemia) ?- Continue Lipitor. ? ?Morbid obesity  ([place]) ?Meets criteria with BMI > 35 plus co-morbidities of HTN and diabetes.  Counseled for weight loss. ? ? ? ? ?  ? ? ?Consultants: Infectious disease, vascular surgery ?Disposition: Home ?Diet recommendation:  ?Discharge Diet Orders (From admission, onward)  ? ?  Start     Ordered  ? 10/23/21 0000  Diet - low sodium heart healthy       ? 10/23/21 [id number]  ? ?  ?  ? ?  ? ?Carb modified diet ?DISCHARGE MEDICATION: ?Allergies as of 10/23/2021   ? ?   Reactions  ? Penicillin G Anaphylaxis  ? Penicillins Anaphylaxis  ? Bupropion Other (See Comments)  ? Mental status change.  ? Escitalopram Oxalate Other (See Comments)  ? Delayed ejaculation.  ? ?  ? ?  ?Medication List  ?  ? ?STOP taking these medications   ? ?cefadroxil 500 MG capsule ?Commonly known as: DURICEF ?  ? ?  ? ?TAKE these medications   ? ?albuterol 108 (90 Base) MCG/ACT inhaler ?Commonly known as: VENTOLIN HFA ?Inhale 1-2 puffs into the lungs every 6 (six) hours as needed for wheezing or shortness of breath. ?  ?atorvastatin 10 MG tablet ?Commonly known as: LIPITOR ?Take 1 tablet (10 mg total) by mouth once daily ?  ?canagliflozin 300 MG Tabs tablet ?Commonly known as: INVOKANA ?Take 1 tablet by mouth daily. ?  ?HYDROcodone-acetaminophen 5-325 MG tablet ?Commonly known as: NORCO/VICODIN ?Take 1 tablet by mouth every 6 (six) hours as needed for up to 3 days for moderate pain. ?  ?linezolid 600 MG tablet ?Commonly known as: Zyvox ?Take 1 tablet (600 mg total) by mouth 2 (two) times daily for 7 days. ?  ?lisinopril 20 MG  tablet ?Commonly known as: ZESTRIL ?Take 20 mg by mouth daily. ?  ?omeprazole 20 MG capsule ?Commonly known as: PRILOSEC ?TAKE 1 CAPSULE BY MOUTH ONCE A DAY ?  ?sildenafil 20 MG tablet ?Commonly known as: REVATIO ?TAKE 3 TABLETS BY MOUTH ONCE DAILY AS NEEDED ?What changed:  ?how much to take ?how to take this ?when to take this ?reasons to take this ?  ? ?  ? ? Follow-up Information   ? ? Kris Hartmann, NP. Go in 4 week(s).   ?Specialty:  Vascular Surgery ?Why: See Eulogio Ditch, NP or Hortencia Pilar with LLE Venous reflux study in 4 weeks 10:30 a.m, ?Contact information: ?[address] ?[place] [address] ?[phone] ? ? ?  ?  ? ? Derinda Late, MD. Schedule an appointment as soon as possible for a visit in 3 day(s).   ?Specialty: Family Medicine ?Why: [facility] Discharge F/UP contacting the patient about the appointment ?Contact information: ?[address] ?[place] [facility] ?[place] [address] ?[phone] ? ? ?  ?  ? ? Tsosie Billing, MD Follow up on 10/28/2021.   ?Specialty: Infectious Diseases ?Why: [facility] Discharge F/UP 11:45 a.m. ?Contact information: ?[place] [address] ?[phone] ? ? ?  ?  ? ?  ?  ? ?  ? ?Discharge Exam: ?Filed Weights  ? 10/17/21 1117  ?Weight: 121.1 kg  ? ?General: Alert and oriented x3, no acute distress ?HEENT: Normocephalic and atraumatic, mucous membranes are moist ?Cardiovascular: Regular rate and rhythm, S1-S2 ?Respiratory: Clear to auscultation bilaterally ?Abdomen: Soft, nontender, nondistended, positive bowel sounds ?Skin: Improvement in the erythema, edema and tenderness on the left lower extremity.  He does have superficial darkening and petechiae of the skin but has been improved since admission ?Psychiatry: Appropriate, no evidence of psychoses ?Neurology: No focal deficits ? ? ?Condition at discharge: good ? ?The results of significant diagnostics from this hospitalization (including imaging, microbiology, ancillary and laboratory) are listed below for reference.  ? ?Imaging Studies: ?DG Tibia/Fibula Left ? ?Result Date: 10/17/2021 ?CLINICAL DATA:  Left lower leg redness and swelling. EXAM: LEFT TIBIA AND FIBULA - 2 VIEW COMPARISON:  None Available. FINDINGS: There is no evidence of fracture or other focal bone lesions. Soft tissues are unremarkable. IMPRESSION: Negative. Electronically Signed   By: Marijo Conception M.D.    On: 10/17/2021 14:21  ? ?US Venous Img Lower Bilateral (DVT) ? ?Result Date: 10/18/2021 ?CLINICAL DATA:  Cellulitis, pain, edema EXAM: BILATERAL LOWER EXTREMITY VENOUS DOPPLER ULTRASOUND TECHNIQUE: Gray-scale sonography with compression, as well as color and duplex ultrasound, were performed to evaluate the deep venous system(s) from the level of the common femoral vein through the popliteal and proximal calf veins. COMPARISON:  None Available. FINDINGS: VENOUS Normal compressibility of the common femoral, superficial femoral, and popliteal veins, as well as the visualized calf veins. Visualized portions of profunda femoral vein and great saphenous vein unremarkable. No filling defects to suggest DVT on grayscale or color Doppler imaging. Doppler waveforms show normal direction of venous flow, normal respiratory plasticity and response to augmentation. OTHER None. Limitations: none IMPRESSION: Negative. Electronically Signed   By: Lucrezia Europe M.D.   On: 10/18/2021 09:32   ? ?Microbiology: ?Results for orders placed or performed during the hospital encounter of 10/17/21  ?Culture, blood (routine x 2)     Status: None  ? Collection Time: 10/17/21  2:23 PM  ? Specimen: BLOOD  ?Result Value Ref Range Status  ?  Specimen Description BLOOD RIGHT ANTECUBITAL  Final  ? Special Requests   Final  ?  BOTTLES DRAWN AEROBIC AND ANAEROBIC Blood Culture results may not be optimal due to an excessive volume of blood received in culture bottles  ? Culture   Final  ?  NO GROWTH 5 DAYS ?Performed at [facility], [address], [place], [place] [address] ?  ? Report Status 10/22/2021 FINAL  Final  ?Culture, blood (routine x 2)     Status: None  ? Collection Time: 10/17/21  5:17 PM  ? Specimen: BLOOD  ?Result Value Ref Range Status  ? Specimen Description BLOOD BLOOD LEFT HAND  Final  ? Special Requests   Final  ?  BOTTLES DRAWN AEROBIC AND ANAEROBIC Blood Culture adequate volume  ? Culture   Final  ?  NO GROWTH 5  DAYS ?Performed at [facility], [address] [place],  [address] ?  ? Report Status 10/22/2021 FINAL  Final  ? ? ?Labs: ?CBC: ?Recent Labs  ?Lab 10/20/21 ?0412 10/22/21 ?[id number]  ?WBC 10.4 8.8  ?HGB 13.9 13.

## 2021-10-27 ENCOUNTER — Other Ambulatory Visit: Payer: Self-pay

## 2021-10-27 DIAGNOSIS — A46 Erysipelas: Secondary | ICD-10-CM | POA: Diagnosis not present

## 2021-10-27 DIAGNOSIS — E119 Type 2 diabetes mellitus without complications: Secondary | ICD-10-CM | POA: Diagnosis not present

## 2021-10-27 DIAGNOSIS — D696 Thrombocytopenia, unspecified: Secondary | ICD-10-CM | POA: Diagnosis not present

## 2021-10-27 MED ORDER — TRAMADOL HCL 50 MG PO TABS
ORAL_TABLET | ORAL | 0 refills | Status: DC
  Filled 2021-10-27: qty 15, 5d supply, fill #0

## 2021-10-28 ENCOUNTER — Encounter: Payer: Self-pay | Admitting: Infectious Diseases

## 2021-10-28 ENCOUNTER — Ambulatory Visit: Payer: 59 | Attending: Infectious Diseases | Admitting: Infectious Diseases

## 2021-10-28 ENCOUNTER — Other Ambulatory Visit: Payer: Self-pay

## 2021-10-28 VITALS — BP 120/74 | HR 73 | Temp 97.4°F | Wt 257.0 lb

## 2021-10-28 DIAGNOSIS — E785 Hyperlipidemia, unspecified: Secondary | ICD-10-CM | POA: Diagnosis not present

## 2021-10-28 DIAGNOSIS — G4733 Obstructive sleep apnea (adult) (pediatric): Secondary | ICD-10-CM | POA: Diagnosis not present

## 2021-10-28 DIAGNOSIS — I10 Essential (primary) hypertension: Secondary | ICD-10-CM | POA: Insufficient documentation

## 2021-10-28 DIAGNOSIS — E119 Type 2 diabetes mellitus without complications: Secondary | ICD-10-CM | POA: Diagnosis not present

## 2021-10-28 DIAGNOSIS — I891 Lymphangitis: Secondary | ICD-10-CM | POA: Diagnosis not present

## 2021-10-28 DIAGNOSIS — L03116 Cellulitis of left lower limb: Secondary | ICD-10-CM | POA: Diagnosis not present

## 2021-10-28 DIAGNOSIS — A46 Erysipelas: Secondary | ICD-10-CM | POA: Diagnosis not present

## 2021-10-28 DIAGNOSIS — F32A Depression, unspecified: Secondary | ICD-10-CM | POA: Insufficient documentation

## 2021-10-28 MED ORDER — MELOXICAM 15 MG PO TABS
ORAL_TABLET | ORAL | 0 refills | Status: DC
  Filled 2021-10-28: qty 30, 30d supply, fill #0

## 2021-10-28 NOTE — Patient Instructions (Signed)
You are here for follow up of the left leg erysipelas, cellulitis-you will complete linezolid in 48 hrs- will talk on Friday/next week ?

## 2021-10-28 NOTE — Progress Notes (Signed)
NAME: Jeffrey KannerDavid Anthony Hannan  ?DOB: 03/31/1961  ?MRN: [id number]  ?Date/Time: 10/28/2021 12:19 PM ? ? ?Subjective:  ?Pt here with his wife for follow up of left leg erysipelas/lymphangitis cellulitis ?? ?Jeffrey KannerDavid Anthony Dinunzio is a 61 y.o. with a history of HTN, OSA, DM, HLD, depression ?Pt was [facility] in [facility]/11 /23 for worsening cellulitis/erysipelas /lymphangitis of the left leg and received IV cefazolin and clindamycin and was discharged on 10/23/21 on linezolid for one more week ?He is doing better ?Pain better than before ?Swelling much better ?No fever at all ?Superficial skin starting to peel ? ? ?[facility] and was Dc on 10/14/21 for fever left leg swelling and received a dose of ceftriaxone on 10/13/21 around 2pm and was discharged the next day on cefadroxil 1 gram Po q12 for 7 days after being seen by Dr.Comer- He [facility] not [facility] he says until he was seen by ID. ?HE has h/o recurrent cellulitis in the left leg -last episode was 2017. But this is the worst one ?He had ankle surgery many years ago and the left leg has some edema since then . ? ?He saw me on 10/16/21 and I asked him to get admitted that evening because of ongoing fever of 102 and the leg infection worsening- He got admitted the next day ? ? ?Past Medical History:  ?Diagnosis Date  ? Depression   ? Diabetes mellitus without complication (HCC)   ? GERD (gastroesophageal reflux disease)   ? Herniated disc   ? HLD (hyperlipidemia)   ? Hypertension   ? Morbid obesity (HCC) 10/18/2021  ? Sleep apnea uses c-pap  ? Streptococcal cellulitis   ? Wears glasses   ?  ?Past Surgical History:  ?Procedure Laterality Date  ? ANKLE SURGERY    ? left fx  ? APPENDECTOMY    ? CARPAL TUNNEL RELEASE Right 10/05/2014  ? Procedure: RIGHT CARPAL TUNNEL RELEASE;  Surgeon: Dominica SeverinWilliam Gramig, MD;  Location: [facility];  Service: Orthopedics;  Laterality: Right;  ? COLONOSCOPY    ? LUMBAR LAMINECTOMY/DECOMPRESSION MICRODISCECTOMY   07/23/2011  ? Procedure: LUMBAR LAMINECTOMY/DECOMPRESSION MICRODISCECTOMY;  Surgeon: Jacki Conesonald A Gioffre, MD;  Location: [facility];  Service: Orthopedics;  Laterality: Right;  ? ULNAR TUNNEL RELEASE Right 10/05/2014  ? Procedure: RIGHT CUBITAL TUNNEL RELEASE;  Surgeon: Dominica SeverinWilliam Gramig, MD;  Location: [facility];  Service: Orthopedics;  Laterality: Right;  ? UMBILICAL HERNIA REPAIR N/A 10/20/2014  ? Procedure: HERNIA REPAIR UMBILICAL ADULT;  Surgeon: Renda RollsWilton Smith, MD;  Location: [facility];  Service: General;  Laterality: N/A;  ? VASECTOMY    ?  ?Social History  ? ?Socioeconomic History  ? Marital status: Married  ?  Spouse name: Not on file  ? Number of children: Not on file  ? Years of education: Not on file  ? Highest education level: Not on file  ?Occupational History  ? Not on file  ?Tobacco Use  ? Smoking status: Every Day  ?  Packs/day: 0.25  ?  Years: 35.00  ?  Pack years: 8.75  ?  Types: Cigarettes  ? Smokeless tobacco: Never  ?Vaping Use  ? Vaping Use: Never used  ?Substance and Sexual Activity  ? Alcohol use: Not Currently  ?  Alcohol/week: 2.0 - 3.0 standard drinks  ?  Types: 2 Cans of beer per week  ?  Comment: rare  ? Drug use: No  ? Sexual activity: Never  ?Other Topics  Concern  ? Not on file  ?Social History Narrative  ? Not on file  ? ?Social Determinants of Health  ? ?Financial Resource Strain: Not on file  ?Food Insecurity: Not on file  ?Transportation Needs: Not on file  ?Physical Activity: Not on file  ?Stress: Not on file  ?Social Connections: Not on file  ?Intimate Partner Violence: Not on file  ?  ?Family History  ?Problem Relation Age of Onset  ? Diabetes Mother   ? Hypertension Father   ? ?Allergies  ?Allergen Reactions  ? Penicillin G Anaphylaxis  ? Penicillins Anaphylaxis  ? Bupropion Other (See Comments)  ?  Mental status change.  ? Escitalopram Oxalate Other (See Comments)  ?  Delayed ejaculation.  ? ?I? ?Current Outpatient Medications  ?Medication Sig Dispense Refill  ? albuterol  (VENTOLIN HFA) 108 (90 Base) MCG/ACT inhaler Inhale 1-2 puffs into the lungs every 6 (six) hours as needed for wheezing or shortness of breath. 18 g 0  ? atorvastatin (LIPITOR) 10 MG tablet Take 1 tablet (10 mg total) by mouth once daily 90 tablet 1  ? canagliflozin (INVOKANA) 300 MG TABS tablet Take 1 tablet by mouth daily.    ? HYDROcodone-acetaminophen (NORCO/VICODIN) 5-325 MG tablet Take 1 tablet by mouth every 6 (six) hours as needed for moderate pain.    ? linezolid (ZYVOX) 600 MG tablet Take 1 tablet (600 mg total) by mouth 2 (two) times daily for 7 days. 14 tablet 0  ? lisinopril (PRINIVIL,ZESTRIL) 20 MG tablet Take 20 mg by mouth daily.    ? omeprazole (PRILOSEC) 20 MG capsule TAKE 1 CAPSULE BY MOUTH ONCE A DAY 90 capsule 3  ? sildenafil (REVATIO) 20 MG tablet TAKE 3 TABLETS BY MOUTH ONCE DAILY AS NEEDED (Patient taking differently: Take 20 mg by mouth daily as needed (Erectial dyfunction).) 60 tablet 1  ? traMADol (ULTRAM) 50 MG tablet Take 1 tablet (50 mg total) by mouth every 6 (six) hours as needed for Pain for up to 5 days (Patient not taking: Reported on 10/28/2021) 15 tablet 0  ? ?No current facility-administered medications for this visit.  ?  ? ?Abtx:  ?Anti-infectives (From admission, onward)  ? ? None  ? ?  ? ? ?REVIEW OF SYSTEMS:  ?Const:  fever,  chills, some weight loss ?Eyes: negative diplopia or visual changes, negative eye pain ?ENT: negative coryza, negative sore throat ?Resp: negative cough, hemoptysis, dyspnea ?Cards: negative for chest pain, palpitations, lower extremity edema ?GU: negative for frequency, dysuria and hematuria ?GI: Negative for abdominal pain, diarrhea, bleeding, constipation ?Skin: redness has resolved more discolored now ?Heme: negative for easy bruising and gum/nose bleeding ?MS: severe left leg pain ?Neurolo:negative for headaches, dizziness, vertigo, memory problems  ?Endocrine: diabetes ?Allergy/Immunology-penicillin ?Objective:  ?VITALS:  ?BP 120/74   Pulse 73    Temp (!) 97.4 ?F (36.3 ?C) (Temporal)   Wt 257 lb (116.6 kg)   BMI 36.88 kg/m?   ?PHYSICAL EXAM:  ?General: Alert, cooperative, no distress, appears stated age.  ?Lungs: Clear to auscultation bilaterally. No Wheezing or Rhonchi. No rales. ?Heart: Regular rate and rhythm, no murmur, rub or gallop. ?Abdomen:did not examine as in wheel chair ?Extremities:left leg skin has superficial dry darkened skin and peeling ? ? ? ? ?Lymph: Cervical, supraclavicular normal. ?Neurologic: Grossly non-focal ?Pertinent Labs ?Lab Results ?CBC ?   ?Component Value Date/Time  ? WBC 8.8 10/22/2021 0427  ? RBC 4.34 10/22/2021 0427  ? HGB 13.3 10/22/2021 0427  ? HCT 38.9 (L) 10/22/2021  3546  ? PLT 281 10/22/2021 0427  ? MCV 89.6 10/22/2021 0427  ? MCH 30.6 10/22/2021 0427  ? MCHC 34.2 10/22/2021 0427  ? RDW 12.9 10/22/2021 0427  ? LYMPHSABS 2.2 10/17/2021 1118  ? MONOABS 1.2 (H) 10/17/2021 1118  ? EOSABS 0.1 10/17/2021 1118  ? BASOSABS 0.1 10/17/2021 1118  ? ? ? ?  Latest Ref Rng & Units 10/22/2021  ?  4:27 AM 10/20/2021  ?  4:12 AM 10/18/2021  ?  9:25 AM  ?CMP  ?Glucose 70 - 99 mg/dL 568   127   517    ?BUN 6 - 20 mg/dL 19   15   14     ?Creatinine 0.61 - 1.24 mg/dL   0.01   7.49    ?Sodium 135 - 145 mmol/L 133   136   133    ?Potassium 3.5 - 5.1 mmol/L 4.1   4.0   3.9    ?Chloride 98 - 111 mmol/L 105   104   103    ?CO2 22 - 32 mmol/L 25   25   22     ?Calcium 8.9 - 10.3 mg/dL 8.3   8.2   8.5    ?Total Protein 6.5 - 8.1 g/dL  6.3   6.7    ?Total Bilirubin 0.3 - 1.2 mg/dL  0.7   1.0    ?Alkaline Phos 38 - 126 U/L  98   116    ?AST 15 - 41 U/L  43   52    ?ALT 0 - 44 U/L  68   88    ? ? ? ? ?Microbiology: ?No results found for this or any previous visit (from the past 240 hour(s)). ? ? ?? ?Impression/Recommendation ??Severe erysipelas, cellulits, lymphagitis of the left leg extending to the thigh ?After received 7 days of Iv he was discharged on a week of linezolid- doing much better ?What he has now is the sequale to the GAS in the way of  skin color changes and peeling ?Will complete Linezolid on Thursday- no further antibiotic is needed ?Will touch base on Friday ?He will upload  pictures next week ?Moisturize the leg with eucerin ? ? ?DM-on

## 2021-10-29 ENCOUNTER — Other Ambulatory Visit: Payer: Self-pay

## 2021-10-30 ENCOUNTER — Inpatient Hospital Stay: Payer: 59 | Admitting: Infectious Diseases

## 2021-11-13 ENCOUNTER — Ambulatory Visit: Payer: 59 | Attending: Infectious Diseases | Admitting: Infectious Diseases

## 2021-11-13 ENCOUNTER — Encounter: Payer: Self-pay | Admitting: Infectious Diseases

## 2021-11-13 VITALS — BP 109/72 | HR 83 | Resp 16 | Ht 70.0 in | Wt 257.0 lb

## 2021-11-13 DIAGNOSIS — I10 Essential (primary) hypertension: Secondary | ICD-10-CM | POA: Diagnosis not present

## 2021-11-13 DIAGNOSIS — G4733 Obstructive sleep apnea (adult) (pediatric): Secondary | ICD-10-CM | POA: Diagnosis not present

## 2021-11-13 DIAGNOSIS — E119 Type 2 diabetes mellitus without complications: Secondary | ICD-10-CM | POA: Diagnosis not present

## 2021-11-13 DIAGNOSIS — E785 Hyperlipidemia, unspecified: Secondary | ICD-10-CM | POA: Insufficient documentation

## 2021-11-13 DIAGNOSIS — A46 Erysipelas: Secondary | ICD-10-CM | POA: Diagnosis not present

## 2021-11-13 DIAGNOSIS — Z09 Encounter for follow-up examination after completed treatment for conditions other than malignant neoplasm: Secondary | ICD-10-CM | POA: Diagnosis not present

## 2021-11-13 DIAGNOSIS — F32A Depression, unspecified: Secondary | ICD-10-CM | POA: Insufficient documentation

## 2021-11-13 DIAGNOSIS — L03116 Cellulitis of left lower limb: Secondary | ICD-10-CM | POA: Diagnosis not present

## 2021-11-13 DIAGNOSIS — M79672 Pain in left foot: Secondary | ICD-10-CM | POA: Diagnosis not present

## 2021-11-13 NOTE — Patient Instructions (Addendum)
You are here  for follow up of the left leg cellulitis. This is much better now- still swelling and skin stain- - continue compression socks, foot and ankle exercises, Follow PRN

## 2021-11-13 NOTE — Progress Notes (Signed)
NAME: Jeffrey [name]  DOB: July 05, 1960  MRN: [id number]  Date/Time: 11/13/2021 11:35 AM   Subjective:  Pt here with his wife for follow up of left leg erysipelas/lymphangitis cellulitis ? Jeffrey [name] is a 61 y.o. with a history of HTN, OSA, DM, HLD, depression Pt was recently in [facility] 10/17/21-5/11 /23 for worsening cellulitis/erysipelas /lymphangitis of the left leg and received IV cefazolin and clindamycin and was discharged on 10/23/21 on linezolid funtil 10/30/21 I last saw him on 10/28/21 and he completed linezolid 48 hr later and has not been on any antibiotics since then Overall feeling better Some swelling left leg Pain 1-2 left ankle No fever or chills He is still very tired and sleeps a lot He is off work till 11/24/21- HE is seeing his PCP today for extending his medical leave  HE has h/o recurrent cellulitis in the left leg -last episode was 2017. But this is the worst one He had ankle surgery many years ago and the left leg has some edema since then .   Past Medical History:  Diagnosis Date   Depression    Diabetes mellitus without complication (HCC)    GERD (gastroesophageal reflux disease)    Herniated disc    HLD (hyperlipidemia)    Hypertension    Morbid obesity (HCC) 10/18/2021   Sleep apnea uses c-pap   Streptococcal cellulitis    Wears glasses     Past Surgical History:  Procedure Laterality Date   ANKLE SURGERY     left fx   APPENDECTOMY     CARPAL TUNNEL RELEASE Right 10/05/2014   Procedure: RIGHT CARPAL TUNNEL RELEASE;  Surgeon: Dominica Severin, MD;  Location: [facility];  Service: Orthopedics;  Laterality: Right;   COLONOSCOPY     LUMBAR LAMINECTOMY/DECOMPRESSION MICRODISCECTOMY  07/23/2011   Procedure: LUMBAR LAMINECTOMY/DECOMPRESSION MICRODISCECTOMY;  Surgeon: Jacki Cones, MD;  Location: [facility];  Service: Orthopedics;  Laterality: Right;   ULNAR TUNNEL RELEASE Right 10/05/2014   Procedure: RIGHT CUBITAL TUNNEL RELEASE;   Surgeon: Dominica Severin, MD;  Location: [facility];  Service: Orthopedics;  Laterality: Right;   UMBILICAL HERNIA REPAIR N/A 10/20/2014   Procedure: HERNIA REPAIR UMBILICAL ADULT;  Surgeon: Renda Rolls, MD;  Location: [facility];  Service: General;  Laterality: N/A;   VASECTOMY      Social History   Socioeconomic History   Marital status: Married    Spouse name: Not on file   Number of children: Not on file   Years of education: Not on file   Highest education level: Not on file  Occupational History   Not on file  Tobacco Use   Smoking status: Every Day    Packs/day: 0.25    Years: 35.00    Pack years: 8.75    Types: Cigarettes   Smokeless tobacco: Never  Vaping Use   Vaping Use: Never used  Substance and Sexual Activity   Alcohol use: Not Currently    Alcohol/week: 2.0 - 3.0 standard drinks    Types: 2 Cans of beer per week    Comment: rare   Drug use: No   Sexual activity: Never  Other Topics Concern   Not on file  Social History Narrative   Not on file   Social Determinants of Health   Financial Resource Strain: Not on file  Food Insecurity: Not on file  Transportation Needs: Not on file  Physical Activity: Not on file  Stress: Not on file  Social Connections: Not  on file  Intimate Partner Violence: Not on file    Family History  Problem Relation Age of Onset   Diabetes Mother    Hypertension Father    Allergies  Allergen Reactions   Penicillin G Anaphylaxis   Penicillins Anaphylaxis   Bupropion Other (See Comments)    Mental status change.   Escitalopram Oxalate Other (See Comments)    Delayed ejaculation.   I? Current Outpatient Medications  Medication Sig Dispense Refill   albuterol (VENTOLIN HFA) 108 (90 Base) MCG/ACT inhaler Inhale 1-2 puffs into the lungs every 6 (six) hours as needed for wheezing or shortness of breath. 18 g 0   atorvastatin (LIPITOR) 10 MG tablet Take 1 tablet (10 mg total) by mouth once daily 90 tablet 1    canagliflozin (INVOKANA) 300 MG TABS tablet Take 1 tablet by mouth daily.     HYDROcodone-acetaminophen (NORCO/VICODIN) 5-325 MG tablet Take 1 tablet by mouth every 6 (six) hours as needed for moderate pain.     lisinopril (PRINIVIL,ZESTRIL) 20 MG tablet Take 20 mg by mouth daily.     meloxicam (MOBIC) 15 MG tablet Take 1 tablet (15 mg total) by mouth once daily 30 tablet 0   omeprazole (PRILOSEC) 20 MG capsule TAKE 1 CAPSULE BY MOUTH ONCE A DAY 90 capsule 3   sildenafil (REVATIO) 20 MG tablet TAKE 3 TABLETS BY MOUTH ONCE DAILY AS NEEDED (Patient taking differently: Take 20 mg by mouth daily as needed (Erectial dyfunction).) 60 tablet 1   traMADol (ULTRAM) 50 MG tablet Take 1 tablet (50 mg total) by mouth every 6 (six) hours as needed for Pain for up to 5 days 15 tablet 0   No current facility-administered medications for this visit.     Abtx:  Anti-infectives (From admission, onward)    None       REVIEW OF SYSTEMS:  Const:  fever,  chills, some weight loss Eyes: negative diplopia or visual changes, negative eye pain ENT: negative coryza, negative sore throat Resp: negative cough, hemoptysis, dyspnea Cards: negative for chest pain, palpitations, lower extremity edema GU: negative for frequency, dysuria and hematuria GI: Negative for abdominal pain, diarrhea, bleeding, constipation Skin: discoloration left leg MS:  left leg pain much better Neurolo:negative for headaches, dizziness, vertigo, memory problems  Endocrine: diabetes Allergy/Immunology-penicillin Objective:  VITALS:  BP 109/72   Pulse 83   Resp 16   Ht 5\' 10"  (1.778 m)   Wt 257 lb (116.6 kg)   SpO2 96%   BMI 36.88 kg/m   PHYSICAL EXAM:  General: Alert, cooperative, no distress, appears stated age.  Lungs: Clear to auscultation bilaterally. No Wheezing or Rhonchi. No rales. Heart: Regular rate and rhythm, no murmur, rub or gallop. Extremities:left leg skin heme staining and discoloration Some edema ankles No  warmth  11/13/21      10/28/21     Lymph: Cervical, supraclavicular normal. Neurologic: Grossly non-focal Pertinent Labs None     ? Impression/Recommendation Severe cellulitis, lymphangitis Of the left leg.  Received appropriate treatment.  Completed oral antibiotics on 10/30/2021.  Doing much better.  No need for any further antibiotics. There is some amount of heme staining and discoloration which is going to persist for a long time. Edema ankles. Asked him to wear compression stockings the first thing in the morning and remove it before going to bed Foot exercises, ankle exercises and calf exercises reemphasized  DM-on  invokana ? Discussed with patient and wife in detail  Follow as needed.

## 2021-11-19 ENCOUNTER — Other Ambulatory Visit (INDEPENDENT_AMBULATORY_CARE_PROVIDER_SITE_OTHER): Payer: Self-pay | Admitting: Nurse Practitioner

## 2021-11-19 DIAGNOSIS — L819 Disorder of pigmentation, unspecified: Secondary | ICD-10-CM

## 2021-11-19 DIAGNOSIS — R600 Localized edema: Secondary | ICD-10-CM

## 2021-11-20 ENCOUNTER — Encounter (INDEPENDENT_AMBULATORY_CARE_PROVIDER_SITE_OTHER): Payer: Self-pay | Admitting: Nurse Practitioner

## 2021-11-20 ENCOUNTER — Ambulatory Visit (INDEPENDENT_AMBULATORY_CARE_PROVIDER_SITE_OTHER): Payer: 59

## 2021-11-20 ENCOUNTER — Ambulatory Visit (INDEPENDENT_AMBULATORY_CARE_PROVIDER_SITE_OTHER): Payer: Self-pay | Admitting: Nurse Practitioner

## 2021-11-20 VITALS — BP 125/80 | HR 79 | Resp 17 | Ht 69.0 in | Wt 258.0 lb

## 2021-11-20 DIAGNOSIS — I10 Essential (primary) hypertension: Secondary | ICD-10-CM

## 2021-11-20 DIAGNOSIS — R600 Localized edema: Secondary | ICD-10-CM

## 2021-11-20 DIAGNOSIS — L819 Disorder of pigmentation, unspecified: Secondary | ICD-10-CM

## 2021-11-20 DIAGNOSIS — A46 Erysipelas: Secondary | ICD-10-CM

## 2021-12-03 ENCOUNTER — Encounter (INDEPENDENT_AMBULATORY_CARE_PROVIDER_SITE_OTHER): Payer: Self-pay | Admitting: Nurse Practitioner

## 2021-12-03 NOTE — Progress Notes (Signed)
Subjective:    Patient ID: Jeffrey [name], male    DOB: 1961/02/01, 61 y.o.   MRN: [id number] No chief complaint on file.   Jeffrey [name] is a 61 year old male who presented to [facility] due to severe erysipelas/cellulitis that progressed over the course of several weeks and extended from his groin to his foot.  The infection is much more under control and the swelling is improved however it was noted that with working with physical therapy the patient's foot becomes discolored within several minutes of being placed in a dependent position.  The discoloration always resolves with elevation.  The patient does have some difficulty and pain with ambulation but not consistent with claudication-like symptoms.  This pain is actually much improved from when he initially presented as he was barely able to stand any pressure on his leg at all.  Previous DVT study was also negative.  The patient has had recurrent episodes of cellulitis in the past however this has been the worst recurrence, with the last episode being in 2017.  Today the patient follows up from recent hospital visit for possible evaluation of venous insufficiency.  Since that time the erysipelas has resolved currently and the swelling is much improved.  He has been very compliant with use of medical grade compression, elevation and activity when possible.  The pain the patient experiences also much improved.  Today the left lower extremity notes no DVT or superficial thrombophlebitis in the left lower extremity.  No evidence of deep venous insufficiency or superficial venous reflux.      Review of Systems  Skin:  Negative for wound.  All other systems reviewed and are negative.      Objective:   Physical Exam Vitals reviewed.  HENT:     Head: Normocephalic.  Cardiovascular:     Rate and Rhythm: Normal rate.     Pulses: Normal pulses.  Pulmonary:     Effort: Pulmonary effort is normal.  Skin:     General: Skin is warm and dry.  Neurological:     Mental Status: He is alert and oriented to person, place, and time.  Psychiatric:        Mood and Affect: Mood normal.        Behavior: Behavior normal.        Thought Content: Thought content normal.        Judgment: Judgment normal.     BP 125/80 (BP Location: Left Arm)   Pulse 79   Resp 17   Ht 5\' 9"  (1.753 m)   Wt 258 lb (117 kg)   BMI 38.10 kg/m   Past Medical History:  Diagnosis Date   Depression    Diabetes mellitus without complication (HCC)    GERD (gastroesophageal reflux disease)    Herniated disc    HLD (hyperlipidemia)    Hypertension    Morbid obesity (HCC) 10/18/2021   Sleep apnea uses c-pap   Streptococcal cellulitis    Wears glasses     Social History   Socioeconomic History   Marital status: Married    Spouse name: Not on file   Number of children: Not on file   Years of education: Not on file   Highest education level: Not on file  Occupational History   Not on file  Tobacco Use   Smoking status: Every Day    Packs/day: 0.25    Years: 35.00    Total pack years: 8.75    Types: Cigarettes  Smokeless tobacco: Never  Vaping Use   Vaping Use: Never used  Substance and Sexual Activity   Alcohol use: Not Currently    Alcohol/week: 2.0 - 3.0 standard drinks of alcohol    Types: 2 Cans of beer per week    Comment: rare   Drug use: No   Sexual activity: Never  Other Topics Concern   Not on file  Social History Narrative   Not on file   Social Determinants of Health   Financial Resource Strain: Not on file  Food Insecurity: Not on file  Transportation Needs: Not on file  Physical Activity: Not on file  Stress: Not on file  Social Connections: Not on file  Intimate Partner Violence: Not on file    Past Surgical History:  Procedure Laterality Date   ANKLE SURGERY     left fx   APPENDECTOMY     CARPAL TUNNEL RELEASE Right 10/05/2014   Procedure: RIGHT CARPAL TUNNEL RELEASE;  Surgeon:  Dominica Severin, MD;  Location: [facility];  Service: Orthopedics;  Laterality: Right;   COLONOSCOPY     LUMBAR LAMINECTOMY/DECOMPRESSION MICRODISCECTOMY  07/23/2011   Procedure: LUMBAR LAMINECTOMY/DECOMPRESSION MICRODISCECTOMY;  Surgeon: Jacki Cones, MD;  Location: [facility];  Service: Orthopedics;  Laterality: Right;   ULNAR TUNNEL RELEASE Right 10/05/2014   Procedure: RIGHT CUBITAL TUNNEL RELEASE;  Surgeon: Dominica Severin, MD;  Location: [facility];  Service: Orthopedics;  Laterality: Right;   UMBILICAL HERNIA REPAIR N/A 10/20/2014   Procedure: HERNIA REPAIR UMBILICAL ADULT;  Surgeon: Renda Rolls, MD;  Location: [facility];  Service: General;  Laterality: N/A;   VASECTOMY      Family History  Problem Relation Age of Onset   Diabetes Mother    Hypertension Father     Allergies  Allergen Reactions   Penicillin G Anaphylaxis   Penicillins Anaphylaxis   Bupropion Other (See Comments)    Mental status change.   Escitalopram Oxalate Other (See Comments)    Delayed ejaculation.       Latest Ref Rng & Units 10/22/2021    4:27 AM 10/20/2021    4:12 AM 10/18/2021    9:25 AM  CBC  WBC 4.0 - 10.5 K/uL 8.8  10.4  10.0   Hemoglobin 13.0 - 17.0 g/dL 12.7  51.7  00.1   Hematocrit 39.0 - 52.0 % 38.9  41.3  42.0   Platelets 150 - 400 K/uL 281  268  189       CMP     Component Value Date/Time   NA 133 (L) 10/22/2021 0427   K 4.1 10/22/2021 0427   CL 105 10/22/2021 0427   CO2 25 10/22/2021 0427   GLUCOSE 172 (H) 10/22/2021 0427   BUN 19 10/22/2021 0427   CREATININE 0.85 10/22/2021 0427   CALCIUM 8.3 (L) 10/22/2021 0427   PROT 6.3 (L) 10/20/2021 0412   ALBUMIN 2.8 (L) 10/20/2021 0412   AST 43 (H) 10/20/2021 0412   ALT 68 (H) 10/20/2021 0412   ALKPHOS 98 10/20/2021 0412   BILITOT 0.7 10/20/2021 0412   GFRNONAA >60 10/22/2021 0427   GFRAA >90 07/22/2011 1010     No results found.     Assessment & Plan:   1. Erysipelas of left lower extremity Today  the patient's lower extremity is much improved.  The patient has no evidence of venous insufficiency.  He is advised to continue with conservative therapy.  He is to follow-up with PCP if he begins to notice  signs symptoms of cellulitis or erysipelas immediately.  Otherwise we will have the patient follow-up with [place] on an as-needed basis.  2. Essential hypertension Continue antihypertensive medications as already ordered, these medications have been reviewed and there are no changes at this time.    Current Outpatient Medications on File Prior to Visit  Medication Sig Dispense Refill   albuterol (VENTOLIN HFA) 108 (90 Base) MCG/ACT inhaler Inhale 1-2 puffs into the lungs every 6 (six) hours as needed for wheezing or shortness of breath. 18 g 0   atorvastatin (LIPITOR) 10 MG tablet Take 1 tablet (10 mg total) by mouth once daily 90 tablet 1   canagliflozin (INVOKANA) 300 MG TABS tablet Take 1 tablet by mouth daily.     HYDROcodone-acetaminophen (NORCO/VICODIN) 5-325 MG tablet Take 1 tablet by mouth every 6 (six) hours as needed for moderate pain.     lisinopril (PRINIVIL,ZESTRIL) 20 MG tablet Take 20 mg by mouth daily.     meloxicam (MOBIC) 15 MG tablet Take 1 tablet (15 mg total) by mouth once daily 30 tablet 0   omeprazole (PRILOSEC) 20 MG capsule TAKE 1 CAPSULE BY MOUTH ONCE A DAY 90 capsule 3   sildenafil (REVATIO) 20 MG tablet TAKE 3 TABLETS BY MOUTH ONCE DAILY AS NEEDED (Patient taking differently: Take 20 mg by mouth daily as needed (Erectial dyfunction).) 60 tablet 1   No current facility-administered medications on file prior to visit.    There are no Patient Instructions on file for this visit. No follow-ups on file.   Georgiana Spinner, NP

## 2021-12-19 ENCOUNTER — Other Ambulatory Visit: Payer: Self-pay

## 2021-12-19 MED ORDER — LISINOPRIL 20 MG PO TABS
ORAL_TABLET | ORAL | 1 refills | Status: DC
  Filled 2021-12-19: qty 90, 90d supply, fill #0
  Filled 2022-03-16: qty 90, 90d supply, fill #1

## 2021-12-19 MED ORDER — OMEPRAZOLE 20 MG PO CPDR
DELAYED_RELEASE_CAPSULE | ORAL | 1 refills | Status: DC
  Filled 2021-12-19: qty 90, 90d supply, fill #0
  Filled 2022-03-16: qty 90, 90d supply, fill #1

## 2021-12-19 MED ORDER — ATORVASTATIN CALCIUM 10 MG PO TABS
10.0000 mg | ORAL_TABLET | Freq: Every day | ORAL | 1 refills | Status: DC
  Filled 2021-12-19: qty 90, 90d supply, fill #0
  Filled 2022-03-16: qty 90, 90d supply, fill #1

## 2021-12-19 MED ORDER — INVOKANA 300 MG PO TABS
ORAL_TABLET | ORAL | 1 refills | Status: DC
  Filled 2021-12-19: qty 90, 90d supply, fill #0
  Filled 2022-03-16: qty 90, 90d supply, fill #1

## 2021-12-22 DIAGNOSIS — G4733 Obstructive sleep apnea (adult) (pediatric): Secondary | ICD-10-CM | POA: Diagnosis not present

## 2022-01-19 DIAGNOSIS — E785 Hyperlipidemia, unspecified: Secondary | ICD-10-CM | POA: Diagnosis not present

## 2022-01-19 DIAGNOSIS — Z79899 Other long term (current) drug therapy: Secondary | ICD-10-CM | POA: Diagnosis not present

## 2022-01-19 DIAGNOSIS — E1169 Type 2 diabetes mellitus with other specified complication: Secondary | ICD-10-CM | POA: Diagnosis not present

## 2022-01-23 DIAGNOSIS — E119 Type 2 diabetes mellitus without complications: Secondary | ICD-10-CM | POA: Diagnosis not present

## 2022-01-23 DIAGNOSIS — Z79899 Other long term (current) drug therapy: Secondary | ICD-10-CM | POA: Diagnosis not present

## 2022-01-23 DIAGNOSIS — D696 Thrombocytopenia, unspecified: Secondary | ICD-10-CM | POA: Diagnosis not present

## 2022-01-23 DIAGNOSIS — E785 Hyperlipidemia, unspecified: Secondary | ICD-10-CM | POA: Diagnosis not present

## 2022-01-23 DIAGNOSIS — Z125 Encounter for screening for malignant neoplasm of prostate: Secondary | ICD-10-CM | POA: Diagnosis not present

## 2022-01-23 DIAGNOSIS — E1169 Type 2 diabetes mellitus with other specified complication: Secondary | ICD-10-CM | POA: Diagnosis not present

## 2022-03-16 ENCOUNTER — Other Ambulatory Visit: Payer: Self-pay

## 2022-03-18 ENCOUNTER — Other Ambulatory Visit: Payer: Self-pay

## 2022-03-18 MED ORDER — INVOKANA 300 MG PO TABS
300.0000 mg | ORAL_TABLET | Freq: Every day | ORAL | 1 refills | Status: DC
  Filled 2022-03-18 – 2022-06-16 (×2): qty 90, 90d supply, fill #0
  Filled 2022-09-11: qty 90, 90d supply, fill #1

## 2022-04-13 ENCOUNTER — Encounter (INDEPENDENT_AMBULATORY_CARE_PROVIDER_SITE_OTHER): Payer: Self-pay

## 2022-05-18 DIAGNOSIS — E785 Hyperlipidemia, unspecified: Secondary | ICD-10-CM | POA: Diagnosis not present

## 2022-05-18 DIAGNOSIS — E119 Type 2 diabetes mellitus without complications: Secondary | ICD-10-CM | POA: Diagnosis not present

## 2022-05-18 DIAGNOSIS — E1169 Type 2 diabetes mellitus with other specified complication: Secondary | ICD-10-CM | POA: Diagnosis not present

## 2022-05-18 DIAGNOSIS — Z79899 Other long term (current) drug therapy: Secondary | ICD-10-CM | POA: Diagnosis not present

## 2022-05-18 DIAGNOSIS — Z125 Encounter for screening for malignant neoplasm of prostate: Secondary | ICD-10-CM | POA: Diagnosis not present

## 2022-05-19 ENCOUNTER — Other Ambulatory Visit: Payer: Self-pay

## 2022-05-19 DIAGNOSIS — Z79899 Other long term (current) drug therapy: Secondary | ICD-10-CM | POA: Diagnosis not present

## 2022-05-19 DIAGNOSIS — E785 Hyperlipidemia, unspecified: Secondary | ICD-10-CM | POA: Diagnosis not present

## 2022-05-19 DIAGNOSIS — Z0000 Encounter for general adult medical examination without abnormal findings: Secondary | ICD-10-CM | POA: Diagnosis not present

## 2022-05-19 DIAGNOSIS — D696 Thrombocytopenia, unspecified: Secondary | ICD-10-CM | POA: Diagnosis not present

## 2022-05-19 DIAGNOSIS — E119 Type 2 diabetes mellitus without complications: Secondary | ICD-10-CM | POA: Diagnosis not present

## 2022-05-19 DIAGNOSIS — Z1331 Encounter for screening for depression: Secondary | ICD-10-CM | POA: Diagnosis not present

## 2022-05-19 DIAGNOSIS — E1169 Type 2 diabetes mellitus with other specified complication: Secondary | ICD-10-CM | POA: Diagnosis not present

## 2022-05-19 DIAGNOSIS — I10 Essential (primary) hypertension: Secondary | ICD-10-CM | POA: Diagnosis not present

## 2022-05-19 MED ORDER — OMEPRAZOLE 20 MG PO CPDR
20.0000 mg | DELAYED_RELEASE_CAPSULE | Freq: Every day | ORAL | 1 refills | Status: DC
  Filled 2022-05-19 – 2022-06-16 (×2): qty 90, 90d supply, fill #0
  Filled 2022-09-11: qty 90, 90d supply, fill #1

## 2022-05-19 MED ORDER — LISINOPRIL 20 MG PO TABS
20.0000 mg | ORAL_TABLET | Freq: Every day | ORAL | 1 refills | Status: DC
  Filled 2022-05-19 – 2022-06-16 (×2): qty 90, 90d supply, fill #0
  Filled 2022-09-11: qty 90, 90d supply, fill #1

## 2022-05-19 MED ORDER — ATORVASTATIN CALCIUM 10 MG PO TABS
10.0000 mg | ORAL_TABLET | Freq: Every day | ORAL | 1 refills | Status: DC
  Filled 2022-05-19 – 2022-06-16 (×2): qty 90, 90d supply, fill #0
  Filled 2022-09-11: qty 90, 90d supply, fill #1

## 2022-05-28 DIAGNOSIS — G4733 Obstructive sleep apnea (adult) (pediatric): Secondary | ICD-10-CM | POA: Diagnosis not present

## 2022-06-16 ENCOUNTER — Other Ambulatory Visit: Payer: Self-pay

## 2022-06-16 MED ORDER — SILDENAFIL CITRATE 20 MG PO TABS
ORAL_TABLET | ORAL | 1 refills | Status: DC
  Filled 2022-06-16: qty 60, 20d supply, fill #0
  Filled 2022-09-11: qty 60, 20d supply, fill #1

## 2022-09-11 ENCOUNTER — Other Ambulatory Visit: Payer: Self-pay

## 2022-10-23 DIAGNOSIS — G4733 Obstructive sleep apnea (adult) (pediatric): Secondary | ICD-10-CM | POA: Diagnosis not present

## 2022-11-17 DIAGNOSIS — Z79899 Other long term (current) drug therapy: Secondary | ICD-10-CM | POA: Diagnosis not present

## 2022-11-17 DIAGNOSIS — E785 Hyperlipidemia, unspecified: Secondary | ICD-10-CM | POA: Diagnosis not present

## 2022-11-17 DIAGNOSIS — E1169 Type 2 diabetes mellitus with other specified complication: Secondary | ICD-10-CM | POA: Diagnosis not present

## 2022-11-24 ENCOUNTER — Other Ambulatory Visit: Payer: Self-pay

## 2022-11-24 DIAGNOSIS — D696 Thrombocytopenia, unspecified: Secondary | ICD-10-CM | POA: Diagnosis not present

## 2022-11-24 DIAGNOSIS — E785 Hyperlipidemia, unspecified: Secondary | ICD-10-CM | POA: Diagnosis not present

## 2022-11-24 DIAGNOSIS — Z79899 Other long term (current) drug therapy: Secondary | ICD-10-CM | POA: Diagnosis not present

## 2022-11-24 DIAGNOSIS — I10 Essential (primary) hypertension: Secondary | ICD-10-CM | POA: Diagnosis not present

## 2022-11-24 DIAGNOSIS — E119 Type 2 diabetes mellitus without complications: Secondary | ICD-10-CM | POA: Diagnosis not present

## 2022-11-24 DIAGNOSIS — G4733 Obstructive sleep apnea (adult) (pediatric): Secondary | ICD-10-CM | POA: Diagnosis not present

## 2022-11-24 DIAGNOSIS — E1169 Type 2 diabetes mellitus with other specified complication: Secondary | ICD-10-CM | POA: Diagnosis not present

## 2022-11-24 DIAGNOSIS — Z125 Encounter for screening for malignant neoplasm of prostate: Secondary | ICD-10-CM | POA: Diagnosis not present

## 2022-11-24 MED ORDER — MELOXICAM 15 MG PO TABS
15.0000 mg | ORAL_TABLET | Freq: Every day | ORAL | 0 refills | Status: DC | PRN
  Filled 2022-11-24: qty 30, 30d supply, fill #0

## 2022-12-04 ENCOUNTER — Other Ambulatory Visit: Payer: Self-pay

## 2022-12-04 MED ORDER — OMEPRAZOLE 20 MG PO CPDR
20.0000 mg | DELAYED_RELEASE_CAPSULE | Freq: Every day | ORAL | 1 refills | Status: DC
  Filled 2022-12-04: qty 90, 90d supply, fill #0
  Filled 2023-03-11: qty 90, 90d supply, fill #1

## 2022-12-04 MED ORDER — INVOKANA 300 MG PO TABS
300.0000 mg | ORAL_TABLET | Freq: Every day | ORAL | 1 refills | Status: DC
  Filled 2022-12-04: qty 90, 90d supply, fill #0
  Filled 2023-03-11: qty 90, 90d supply, fill #1

## 2022-12-04 MED ORDER — ATORVASTATIN CALCIUM 10 MG PO TABS
10.0000 mg | ORAL_TABLET | Freq: Every day | ORAL | 1 refills | Status: DC
  Filled 2022-12-04: qty 90, 90d supply, fill #0
  Filled 2023-03-11: qty 90, 90d supply, fill #1

## 2022-12-04 MED ORDER — SILDENAFIL CITRATE 20 MG PO TABS
60.0000 mg | ORAL_TABLET | Freq: Every day | ORAL | 1 refills | Status: DC | PRN
  Filled 2022-12-04: qty 60, 20d supply, fill #0
  Filled 2023-03-11: qty 60, 20d supply, fill #1

## 2022-12-04 MED ORDER — LISINOPRIL 20 MG PO TABS
20.0000 mg | ORAL_TABLET | Freq: Every day | ORAL | 1 refills | Status: DC
  Filled 2022-12-04: qty 90, 90d supply, fill #0
  Filled 2023-03-11: qty 90, 90d supply, fill #1

## 2023-03-11 ENCOUNTER — Other Ambulatory Visit: Payer: Self-pay

## 2023-03-11 MED ORDER — MELOXICAM 15 MG PO TABS
15.0000 mg | ORAL_TABLET | Freq: Every day | ORAL | 0 refills | Status: DC | PRN
  Filled 2023-03-11: qty 30, 30d supply, fill #0

## 2023-03-12 ENCOUNTER — Other Ambulatory Visit: Payer: Self-pay

## 2023-03-15 ENCOUNTER — Other Ambulatory Visit: Payer: Self-pay

## 2023-03-22 DIAGNOSIS — G4733 Obstructive sleep apnea (adult) (pediatric): Secondary | ICD-10-CM | POA: Diagnosis not present

## 2023-05-25 DIAGNOSIS — E785 Hyperlipidemia, unspecified: Secondary | ICD-10-CM | POA: Diagnosis not present

## 2023-05-25 DIAGNOSIS — Z79899 Other long term (current) drug therapy: Secondary | ICD-10-CM | POA: Diagnosis not present

## 2023-05-25 DIAGNOSIS — E1169 Type 2 diabetes mellitus with other specified complication: Secondary | ICD-10-CM | POA: Diagnosis not present

## 2023-05-25 DIAGNOSIS — Z125 Encounter for screening for malignant neoplasm of prostate: Secondary | ICD-10-CM | POA: Diagnosis not present

## 2023-06-01 ENCOUNTER — Other Ambulatory Visit: Payer: Self-pay

## 2023-06-01 DIAGNOSIS — E785 Hyperlipidemia, unspecified: Secondary | ICD-10-CM | POA: Diagnosis not present

## 2023-06-01 DIAGNOSIS — Z79899 Other long term (current) drug therapy: Secondary | ICD-10-CM | POA: Diagnosis not present

## 2023-06-01 DIAGNOSIS — G4733 Obstructive sleep apnea (adult) (pediatric): Secondary | ICD-10-CM | POA: Diagnosis not present

## 2023-06-01 DIAGNOSIS — I10 Essential (primary) hypertension: Secondary | ICD-10-CM | POA: Diagnosis not present

## 2023-06-01 DIAGNOSIS — D696 Thrombocytopenia, unspecified: Secondary | ICD-10-CM | POA: Diagnosis not present

## 2023-06-01 DIAGNOSIS — Z1331 Encounter for screening for depression: Secondary | ICD-10-CM | POA: Diagnosis not present

## 2023-06-01 DIAGNOSIS — E1169 Type 2 diabetes mellitus with other specified complication: Secondary | ICD-10-CM | POA: Diagnosis not present

## 2023-06-01 DIAGNOSIS — Z1211 Encounter for screening for malignant neoplasm of colon: Secondary | ICD-10-CM | POA: Diagnosis not present

## 2023-06-01 DIAGNOSIS — Z0000 Encounter for general adult medical examination without abnormal findings: Secondary | ICD-10-CM | POA: Diagnosis not present

## 2023-06-01 MED ORDER — SILDENAFIL CITRATE 20 MG PO TABS
60.0000 mg | ORAL_TABLET | Freq: Every day | ORAL | 1 refills | Status: DC | PRN
  Filled 2023-06-01 – 2023-09-09 (×2): qty 60, 20d supply, fill #0
  Filled 2023-12-13: qty 60, 20d supply, fill #1

## 2023-06-01 MED ORDER — OMEPRAZOLE 20 MG PO CPDR
20.0000 mg | DELAYED_RELEASE_CAPSULE | Freq: Every day | ORAL | 1 refills | Status: DC
  Filled 2023-06-01: qty 90, 90d supply, fill #0
  Filled 2023-09-09: qty 90, 90d supply, fill #1

## 2023-06-01 MED ORDER — INVOKANA 300 MG PO TABS
300.0000 mg | ORAL_TABLET | Freq: Every day | ORAL | 1 refills | Status: DC
Start: 2023-06-01 — End: 2023-09-13
  Filled 2023-06-01: qty 90, 90d supply, fill #0
  Filled 2023-09-09: qty 90, 90d supply, fill #1

## 2023-06-01 MED ORDER — ATORVASTATIN CALCIUM 10 MG PO TABS
10.0000 mg | ORAL_TABLET | Freq: Every day | ORAL | 1 refills | Status: DC
  Filled 2023-06-01: qty 90, 90d supply, fill #0
  Filled 2023-09-09: qty 90, 90d supply, fill #1

## 2023-06-01 MED ORDER — ACCU-CHEK GUIDE TEST VI STRP
1.0000 | ORAL_STRIP | Freq: Two times a day (BID) | 3 refills | Status: AC
  Filled 2023-06-01: qty 100, 50d supply, fill #0

## 2023-06-01 MED ORDER — LISINOPRIL 20 MG PO TABS
20.0000 mg | ORAL_TABLET | Freq: Every day | ORAL | 1 refills | Status: DC
  Filled 2023-06-01: qty 90, 90d supply, fill #0
  Filled 2023-09-09: qty 90, 90d supply, fill #1

## 2023-06-14 ENCOUNTER — Other Ambulatory Visit: Payer: Self-pay

## 2023-06-14 MED ORDER — FREESTYLE LANCETS MISC
1.0000 | Freq: Two times a day (BID) | 3 refills | Status: AC
  Filled 2023-06-14: qty 100, 50d supply, fill #0

## 2023-06-14 MED ORDER — FREESTYLE FREEDOM LITE W/DEVICE KIT
PACK | 0 refills | Status: AC
Start: 2023-06-14 — End: ?
  Filled 2023-06-14: qty 1, 30d supply, fill #0

## 2023-07-22 DIAGNOSIS — M5451 Vertebrogenic low back pain: Secondary | ICD-10-CM | POA: Diagnosis not present

## 2023-07-22 DIAGNOSIS — M5137 Other intervertebral disc degeneration, lumbosacral region with discogenic back pain only: Secondary | ICD-10-CM | POA: Diagnosis not present

## 2023-07-22 DIAGNOSIS — M9904 Segmental and somatic dysfunction of sacral region: Secondary | ICD-10-CM | POA: Diagnosis not present

## 2023-07-22 DIAGNOSIS — M9903 Segmental and somatic dysfunction of lumbar region: Secondary | ICD-10-CM | POA: Diagnosis not present

## 2023-07-22 DIAGNOSIS — M7918 Myalgia, other site: Secondary | ICD-10-CM | POA: Diagnosis not present

## 2023-07-22 DIAGNOSIS — M5136 Other intervertebral disc degeneration, lumbar region with discogenic back pain only: Secondary | ICD-10-CM | POA: Diagnosis not present

## 2023-07-27 DIAGNOSIS — M5451 Vertebrogenic low back pain: Secondary | ICD-10-CM | POA: Diagnosis not present

## 2023-07-27 DIAGNOSIS — M7918 Myalgia, other site: Secondary | ICD-10-CM | POA: Diagnosis not present

## 2023-07-27 DIAGNOSIS — M5136 Other intervertebral disc degeneration, lumbar region with discogenic back pain only: Secondary | ICD-10-CM | POA: Diagnosis not present

## 2023-07-27 DIAGNOSIS — M9903 Segmental and somatic dysfunction of lumbar region: Secondary | ICD-10-CM | POA: Diagnosis not present

## 2023-07-27 DIAGNOSIS — M9904 Segmental and somatic dysfunction of sacral region: Secondary | ICD-10-CM | POA: Diagnosis not present

## 2023-07-27 DIAGNOSIS — M5137 Other intervertebral disc degeneration, lumbosacral region with discogenic back pain only: Secondary | ICD-10-CM | POA: Diagnosis not present

## 2023-07-29 DIAGNOSIS — M7918 Myalgia, other site: Secondary | ICD-10-CM | POA: Diagnosis not present

## 2023-07-29 DIAGNOSIS — M5451 Vertebrogenic low back pain: Secondary | ICD-10-CM | POA: Diagnosis not present

## 2023-07-29 DIAGNOSIS — M9904 Segmental and somatic dysfunction of sacral region: Secondary | ICD-10-CM | POA: Diagnosis not present

## 2023-07-29 DIAGNOSIS — M9903 Segmental and somatic dysfunction of lumbar region: Secondary | ICD-10-CM | POA: Diagnosis not present

## 2023-07-29 DIAGNOSIS — M5137 Other intervertebral disc degeneration, lumbosacral region with discogenic back pain only: Secondary | ICD-10-CM | POA: Diagnosis not present

## 2023-07-29 DIAGNOSIS — M5136 Other intervertebral disc degeneration, lumbar region with discogenic back pain only: Secondary | ICD-10-CM | POA: Diagnosis not present

## 2023-08-02 DIAGNOSIS — M9904 Segmental and somatic dysfunction of sacral region: Secondary | ICD-10-CM | POA: Diagnosis not present

## 2023-08-02 DIAGNOSIS — M5136 Other intervertebral disc degeneration, lumbar region with discogenic back pain only: Secondary | ICD-10-CM | POA: Diagnosis not present

## 2023-08-02 DIAGNOSIS — M5137 Other intervertebral disc degeneration, lumbosacral region with discogenic back pain only: Secondary | ICD-10-CM | POA: Diagnosis not present

## 2023-08-02 DIAGNOSIS — M9903 Segmental and somatic dysfunction of lumbar region: Secondary | ICD-10-CM | POA: Diagnosis not present

## 2023-08-02 DIAGNOSIS — M7918 Myalgia, other site: Secondary | ICD-10-CM | POA: Diagnosis not present

## 2023-08-02 DIAGNOSIS — M5451 Vertebrogenic low back pain: Secondary | ICD-10-CM | POA: Diagnosis not present

## 2023-08-03 DIAGNOSIS — M5451 Vertebrogenic low back pain: Secondary | ICD-10-CM | POA: Diagnosis not present

## 2023-08-03 DIAGNOSIS — M9904 Segmental and somatic dysfunction of sacral region: Secondary | ICD-10-CM | POA: Diagnosis not present

## 2023-08-03 DIAGNOSIS — M7918 Myalgia, other site: Secondary | ICD-10-CM | POA: Diagnosis not present

## 2023-08-03 DIAGNOSIS — M5137 Other intervertebral disc degeneration, lumbosacral region with discogenic back pain only: Secondary | ICD-10-CM | POA: Diagnosis not present

## 2023-08-03 DIAGNOSIS — M9903 Segmental and somatic dysfunction of lumbar region: Secondary | ICD-10-CM | POA: Diagnosis not present

## 2023-08-03 DIAGNOSIS — M5136 Other intervertebral disc degeneration, lumbar region with discogenic back pain only: Secondary | ICD-10-CM | POA: Diagnosis not present

## 2023-08-05 DIAGNOSIS — M5451 Vertebrogenic low back pain: Secondary | ICD-10-CM | POA: Diagnosis not present

## 2023-08-05 DIAGNOSIS — M9904 Segmental and somatic dysfunction of sacral region: Secondary | ICD-10-CM | POA: Diagnosis not present

## 2023-08-05 DIAGNOSIS — M7918 Myalgia, other site: Secondary | ICD-10-CM | POA: Diagnosis not present

## 2023-08-05 DIAGNOSIS — M9903 Segmental and somatic dysfunction of lumbar region: Secondary | ICD-10-CM | POA: Diagnosis not present

## 2023-08-05 DIAGNOSIS — M5137 Other intervertebral disc degeneration, lumbosacral region with discogenic back pain only: Secondary | ICD-10-CM | POA: Diagnosis not present

## 2023-08-05 DIAGNOSIS — M5136 Other intervertebral disc degeneration, lumbar region with discogenic back pain only: Secondary | ICD-10-CM | POA: Diagnosis not present

## 2023-08-09 DIAGNOSIS — M9904 Segmental and somatic dysfunction of sacral region: Secondary | ICD-10-CM | POA: Diagnosis not present

## 2023-08-09 DIAGNOSIS — M9903 Segmental and somatic dysfunction of lumbar region: Secondary | ICD-10-CM | POA: Diagnosis not present

## 2023-08-09 DIAGNOSIS — M5136 Other intervertebral disc degeneration, lumbar region with discogenic back pain only: Secondary | ICD-10-CM | POA: Diagnosis not present

## 2023-08-09 DIAGNOSIS — M5451 Vertebrogenic low back pain: Secondary | ICD-10-CM | POA: Diagnosis not present

## 2023-08-09 DIAGNOSIS — M7918 Myalgia, other site: Secondary | ICD-10-CM | POA: Diagnosis not present

## 2023-08-09 DIAGNOSIS — M5137 Other intervertebral disc degeneration, lumbosacral region with discogenic back pain only: Secondary | ICD-10-CM | POA: Diagnosis not present

## 2023-08-12 DIAGNOSIS — M9903 Segmental and somatic dysfunction of lumbar region: Secondary | ICD-10-CM | POA: Diagnosis not present

## 2023-08-12 DIAGNOSIS — M9904 Segmental and somatic dysfunction of sacral region: Secondary | ICD-10-CM | POA: Diagnosis not present

## 2023-08-12 DIAGNOSIS — M5136 Other intervertebral disc degeneration, lumbar region with discogenic back pain only: Secondary | ICD-10-CM | POA: Diagnosis not present

## 2023-08-12 DIAGNOSIS — M5451 Vertebrogenic low back pain: Secondary | ICD-10-CM | POA: Diagnosis not present

## 2023-08-12 DIAGNOSIS — M7918 Myalgia, other site: Secondary | ICD-10-CM | POA: Diagnosis not present

## 2023-08-12 DIAGNOSIS — M5137 Other intervertebral disc degeneration, lumbosacral region with discogenic back pain only: Secondary | ICD-10-CM | POA: Diagnosis not present

## 2023-08-16 DIAGNOSIS — M5137 Other intervertebral disc degeneration, lumbosacral region with discogenic back pain only: Secondary | ICD-10-CM | POA: Diagnosis not present

## 2023-08-16 DIAGNOSIS — M9903 Segmental and somatic dysfunction of lumbar region: Secondary | ICD-10-CM | POA: Diagnosis not present

## 2023-08-16 DIAGNOSIS — M9904 Segmental and somatic dysfunction of sacral region: Secondary | ICD-10-CM | POA: Diagnosis not present

## 2023-08-16 DIAGNOSIS — M7918 Myalgia, other site: Secondary | ICD-10-CM | POA: Diagnosis not present

## 2023-08-16 DIAGNOSIS — M5451 Vertebrogenic low back pain: Secondary | ICD-10-CM | POA: Diagnosis not present

## 2023-08-16 DIAGNOSIS — M5136 Other intervertebral disc degeneration, lumbar region with discogenic back pain only: Secondary | ICD-10-CM | POA: Diagnosis not present

## 2023-08-17 DIAGNOSIS — M5137 Other intervertebral disc degeneration, lumbosacral region with discogenic back pain only: Secondary | ICD-10-CM | POA: Diagnosis not present

## 2023-08-17 DIAGNOSIS — M9904 Segmental and somatic dysfunction of sacral region: Secondary | ICD-10-CM | POA: Diagnosis not present

## 2023-08-17 DIAGNOSIS — M7918 Myalgia, other site: Secondary | ICD-10-CM | POA: Diagnosis not present

## 2023-08-17 DIAGNOSIS — M5136 Other intervertebral disc degeneration, lumbar region with discogenic back pain only: Secondary | ICD-10-CM | POA: Diagnosis not present

## 2023-08-17 DIAGNOSIS — M5451 Vertebrogenic low back pain: Secondary | ICD-10-CM | POA: Diagnosis not present

## 2023-08-17 DIAGNOSIS — M9903 Segmental and somatic dysfunction of lumbar region: Secondary | ICD-10-CM | POA: Diagnosis not present

## 2023-08-19 DIAGNOSIS — M5136 Other intervertebral disc degeneration, lumbar region with discogenic back pain only: Secondary | ICD-10-CM | POA: Diagnosis not present

## 2023-08-19 DIAGNOSIS — M5137 Other intervertebral disc degeneration, lumbosacral region with discogenic back pain only: Secondary | ICD-10-CM | POA: Diagnosis not present

## 2023-08-19 DIAGNOSIS — M5451 Vertebrogenic low back pain: Secondary | ICD-10-CM | POA: Diagnosis not present

## 2023-08-19 DIAGNOSIS — M9904 Segmental and somatic dysfunction of sacral region: Secondary | ICD-10-CM | POA: Diagnosis not present

## 2023-08-19 DIAGNOSIS — M9903 Segmental and somatic dysfunction of lumbar region: Secondary | ICD-10-CM | POA: Diagnosis not present

## 2023-08-19 DIAGNOSIS — M7918 Myalgia, other site: Secondary | ICD-10-CM | POA: Diagnosis not present

## 2023-08-23 DIAGNOSIS — M7918 Myalgia, other site: Secondary | ICD-10-CM | POA: Diagnosis not present

## 2023-08-23 DIAGNOSIS — M5137 Other intervertebral disc degeneration, lumbosacral region with discogenic back pain only: Secondary | ICD-10-CM | POA: Diagnosis not present

## 2023-08-23 DIAGNOSIS — M9903 Segmental and somatic dysfunction of lumbar region: Secondary | ICD-10-CM | POA: Diagnosis not present

## 2023-08-23 DIAGNOSIS — M5136 Other intervertebral disc degeneration, lumbar region with discogenic back pain only: Secondary | ICD-10-CM | POA: Diagnosis not present

## 2023-08-23 DIAGNOSIS — M5451 Vertebrogenic low back pain: Secondary | ICD-10-CM | POA: Diagnosis not present

## 2023-08-23 DIAGNOSIS — M9904 Segmental and somatic dysfunction of sacral region: Secondary | ICD-10-CM | POA: Diagnosis not present

## 2023-08-24 DIAGNOSIS — M9903 Segmental and somatic dysfunction of lumbar region: Secondary | ICD-10-CM | POA: Diagnosis not present

## 2023-08-24 DIAGNOSIS — M9904 Segmental and somatic dysfunction of sacral region: Secondary | ICD-10-CM | POA: Diagnosis not present

## 2023-08-24 DIAGNOSIS — M5451 Vertebrogenic low back pain: Secondary | ICD-10-CM | POA: Diagnosis not present

## 2023-08-24 DIAGNOSIS — M5137 Other intervertebral disc degeneration, lumbosacral region with discogenic back pain only: Secondary | ICD-10-CM | POA: Diagnosis not present

## 2023-08-24 DIAGNOSIS — M5136 Other intervertebral disc degeneration, lumbar region with discogenic back pain only: Secondary | ICD-10-CM | POA: Diagnosis not present

## 2023-08-24 DIAGNOSIS — M7918 Myalgia, other site: Secondary | ICD-10-CM | POA: Diagnosis not present

## 2023-09-09 ENCOUNTER — Other Ambulatory Visit: Payer: Self-pay

## 2023-09-13 ENCOUNTER — Other Ambulatory Visit: Payer: Self-pay

## 2023-09-13 MED ORDER — INVOKANA 300 MG PO TABS
300.0000 mg | ORAL_TABLET | Freq: Every day | ORAL | 1 refills | Status: DC
  Filled 2023-12-13: qty 90, 90d supply, fill #0
  Filled 2024-02-29: qty 90, 90d supply, fill #1

## 2023-09-27 ENCOUNTER — Other Ambulatory Visit: Payer: Self-pay

## 2023-09-27 DIAGNOSIS — J301 Allergic rhinitis due to pollen: Secondary | ICD-10-CM | POA: Diagnosis not present

## 2023-09-27 DIAGNOSIS — H6982 Other specified disorders of Eustachian tube, left ear: Secondary | ICD-10-CM | POA: Diagnosis not present

## 2023-09-27 DIAGNOSIS — H66002 Acute suppurative otitis media without spontaneous rupture of ear drum, left ear: Secondary | ICD-10-CM | POA: Diagnosis not present

## 2023-09-27 MED ORDER — CIPROFLOXACIN-DEXAMETHASONE 0.3-0.1 % OT SUSP
4.0000 [drp] | Freq: Two times a day (BID) | OTIC | 0 refills | Status: AC
  Filled 2023-09-27: qty 7.5, 19d supply, fill #0

## 2023-09-27 MED ORDER — DOXYCYCLINE HYCLATE 100 MG PO CAPS
100.0000 mg | ORAL_CAPSULE | Freq: Two times a day (BID) | ORAL | 0 refills | Status: DC
  Filled 2023-09-27: qty 20, 10d supply, fill #0

## 2023-10-23 IMAGING — US US EXTREM LOW VENOUS
1 series · 14 of 24 positions shown · non-contrast
Comparison: None Available.

CLINICAL DATA: Cellulitis, pain, edema

EXAM:
BILATERAL LOWER EXTREMITY VENOUS DOPPLER ULTRASOUND
TECHNIQUE: Gray-scale sonography with compression, as well as color and duplex
ultrasound, were performed to evaluate the deep venous system(s)
from the level of the common femoral vein through the popliteal and
proximal calf veins.

[Series 1: us venous img lower bilat (dvt) · portal-venous · 14 of 57 slices shown]
[im 1/57]
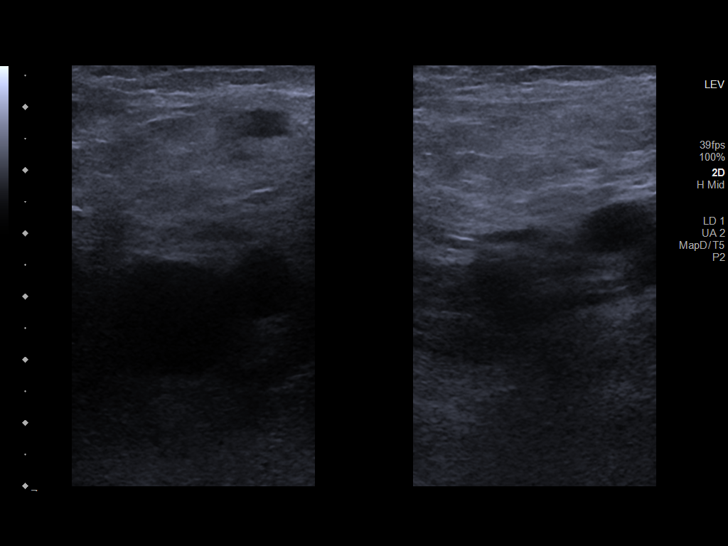
[im 5/57]
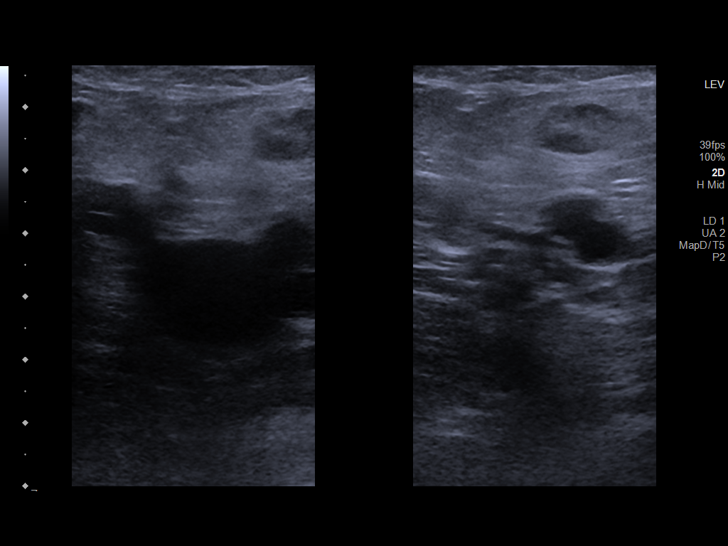
[im 10/57]
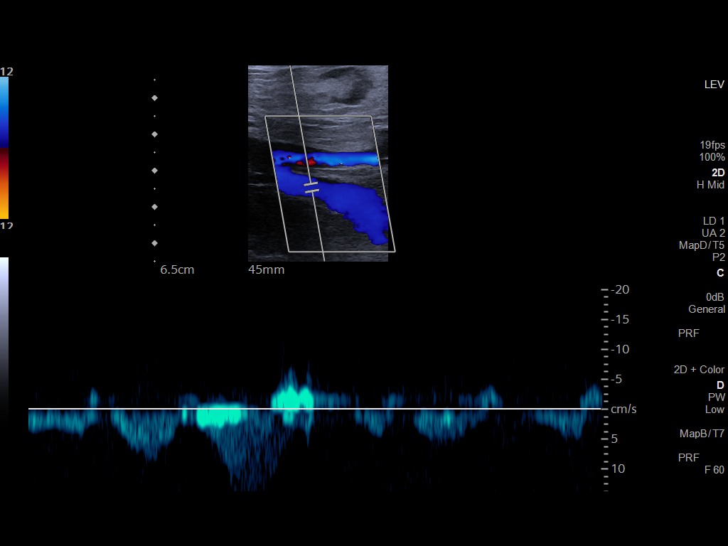
[im 15/57]
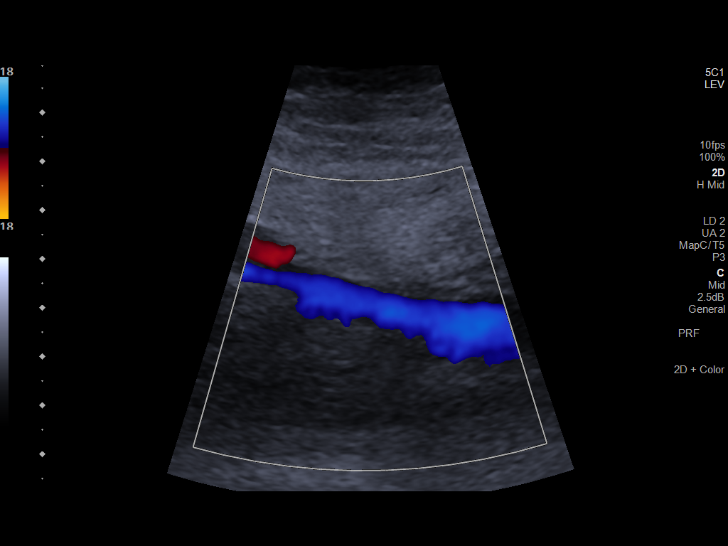
[im 18/57]
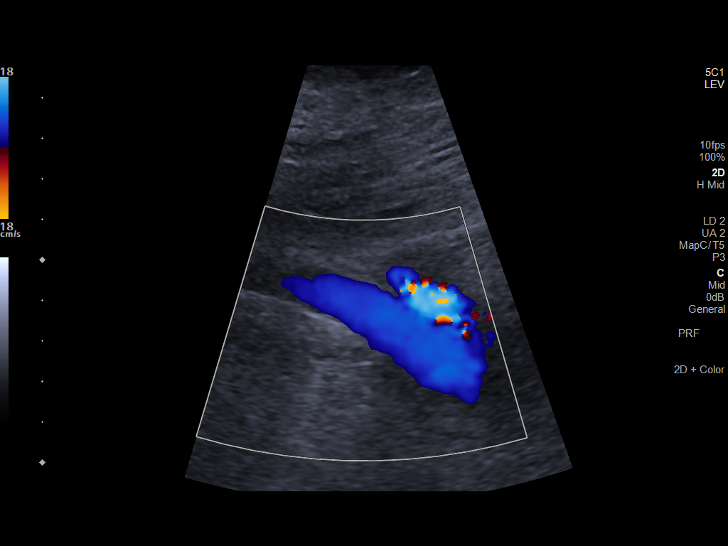
[im 22/57]
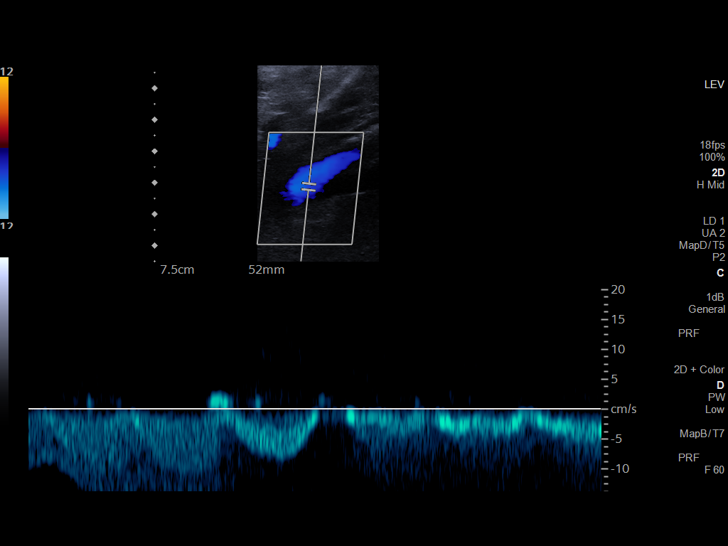
[im 27/57]
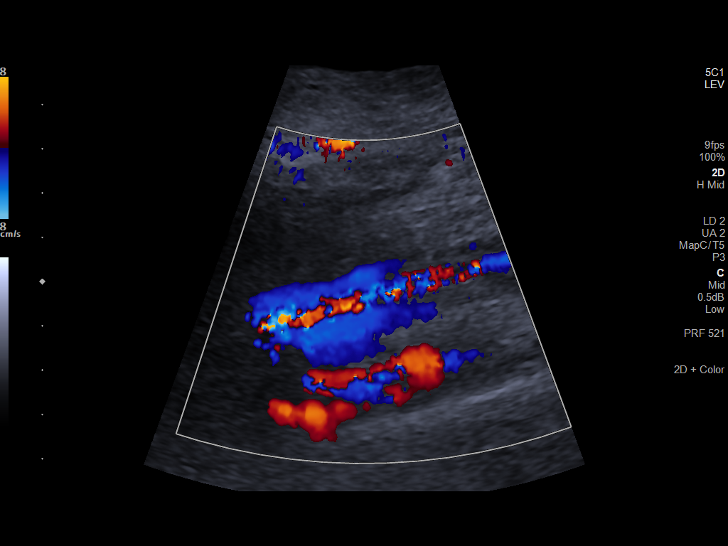
[im 30/57]
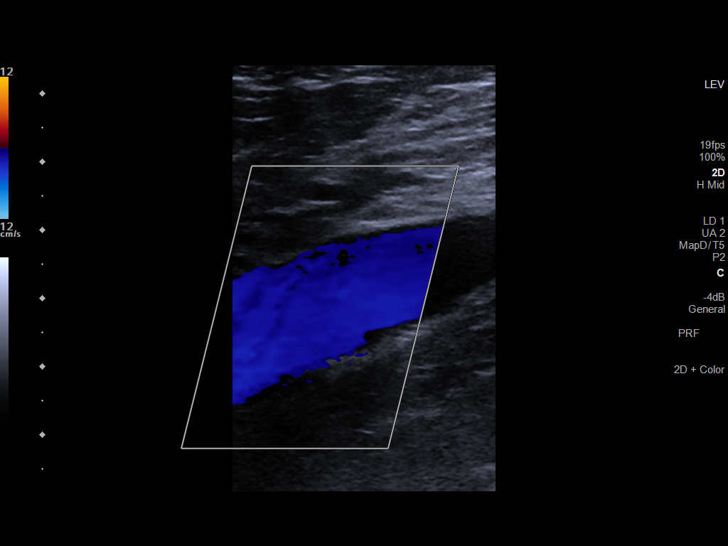
[im 35/57]
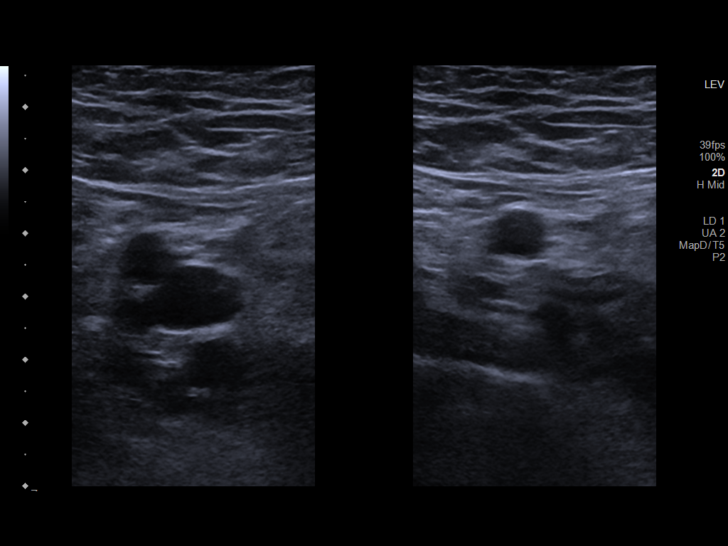
[im 39/57]
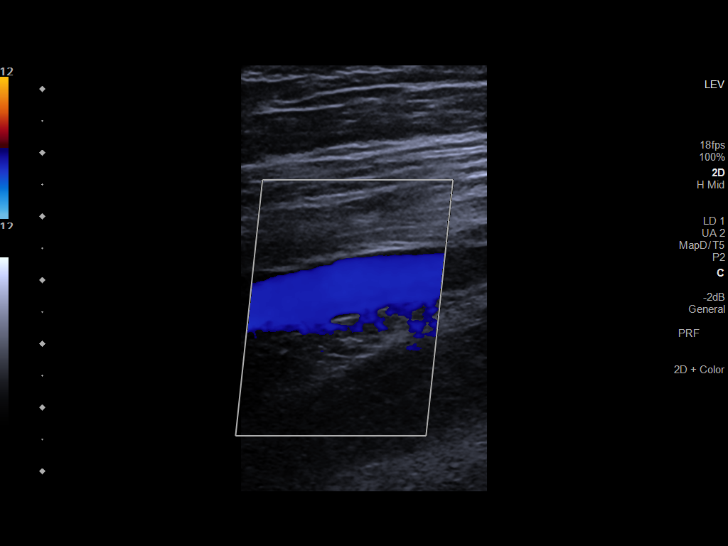
[im 44/57]
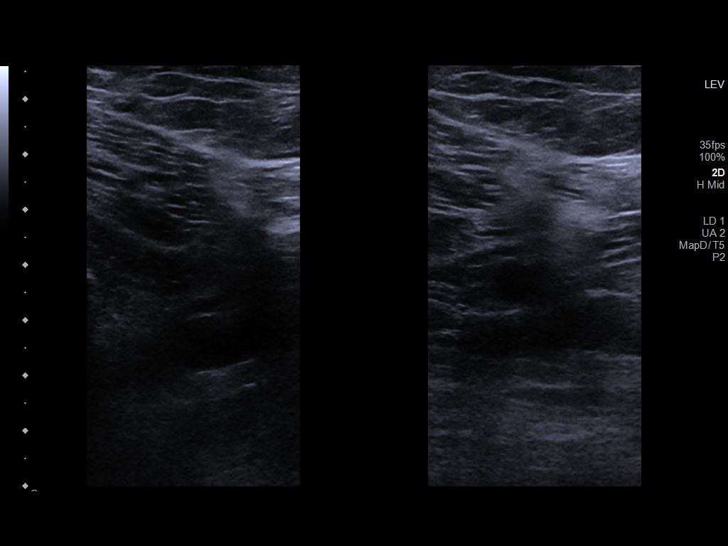
[im 47/57]
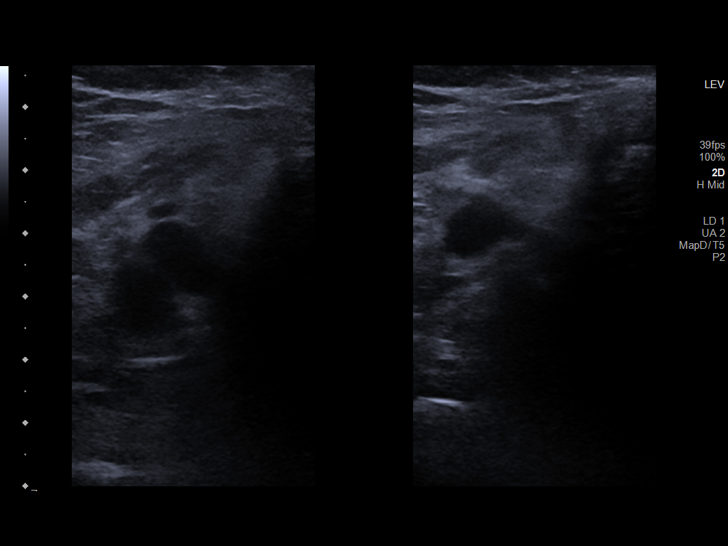
[im 52/57]
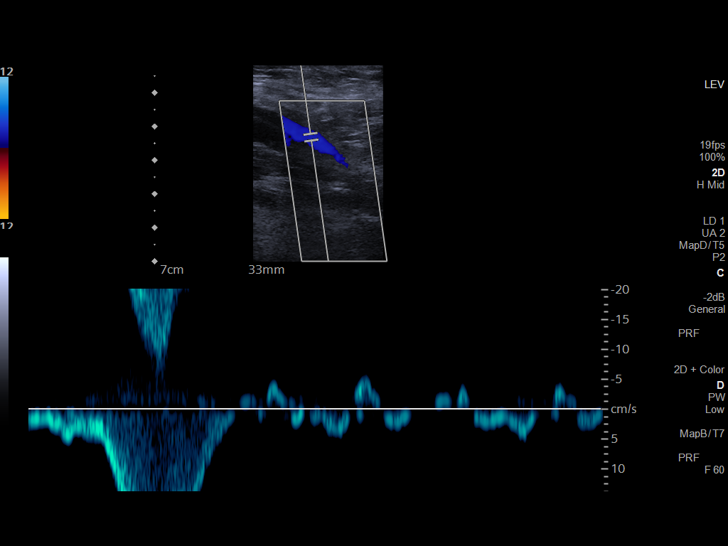
[im 57/57]
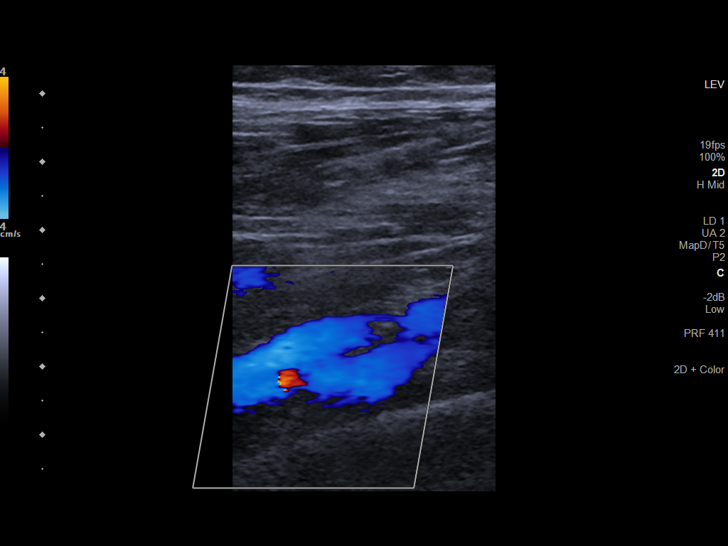

[14 of 24 positions shown; findings below may reference images not displayed]

FINDINGS: VENOUS

Normal compressibility of the common femoral, superficial femoral,
and popliteal veins, as well as the visualized calf veins.
Visualized portions of profunda femoral vein and great saphenous
vein unremarkable. No filling defects to suggest DVT on grayscale or
color Doppler imaging. Doppler waveforms show normal direction of
venous flow, normal respiratory plasticity and response to
augmentation.

OTHER

None.

Limitations: none
IMPRESSION: Negative.

## 2023-11-05 ENCOUNTER — Other Ambulatory Visit: Payer: Self-pay

## 2023-11-05 MED ORDER — NA SULFATE-K SULFATE-MG SULF 17.5-3.13-1.6 GM/177ML PO SOLN
ORAL | 0 refills | Status: DC
  Filled 2023-11-05 – 2023-11-22 (×2): qty 354, 1d supply, fill #0

## 2023-11-16 ENCOUNTER — Other Ambulatory Visit: Payer: Self-pay

## 2023-11-22 ENCOUNTER — Other Ambulatory Visit: Payer: Self-pay

## 2023-11-22 DIAGNOSIS — Z79899 Other long term (current) drug therapy: Secondary | ICD-10-CM | POA: Diagnosis not present

## 2023-11-22 DIAGNOSIS — E1169 Type 2 diabetes mellitus with other specified complication: Secondary | ICD-10-CM | POA: Diagnosis not present

## 2023-11-22 DIAGNOSIS — E785 Hyperlipidemia, unspecified: Secondary | ICD-10-CM | POA: Diagnosis not present

## 2023-11-30 ENCOUNTER — Other Ambulatory Visit: Payer: Self-pay

## 2023-11-30 DIAGNOSIS — E1169 Type 2 diabetes mellitus with other specified complication: Secondary | ICD-10-CM | POA: Diagnosis not present

## 2023-11-30 DIAGNOSIS — I10 Essential (primary) hypertension: Secondary | ICD-10-CM | POA: Diagnosis not present

## 2023-11-30 DIAGNOSIS — Z79899 Other long term (current) drug therapy: Secondary | ICD-10-CM | POA: Diagnosis not present

## 2023-11-30 DIAGNOSIS — D696 Thrombocytopenia, unspecified: Secondary | ICD-10-CM | POA: Diagnosis not present

## 2023-11-30 DIAGNOSIS — E785 Hyperlipidemia, unspecified: Secondary | ICD-10-CM | POA: Diagnosis not present

## 2023-11-30 DIAGNOSIS — G4733 Obstructive sleep apnea (adult) (pediatric): Secondary | ICD-10-CM | POA: Diagnosis not present

## 2023-11-30 DIAGNOSIS — Z125 Encounter for screening for malignant neoplasm of prostate: Secondary | ICD-10-CM | POA: Diagnosis not present

## 2023-11-30 MED ORDER — ATORVASTATIN CALCIUM 10 MG PO TABS
10.0000 mg | ORAL_TABLET | Freq: Every day | ORAL | 3 refills | Status: AC
  Filled 2023-11-30 – 2023-12-13 (×2): qty 90, 90d supply, fill #0
  Filled 2024-02-29: qty 90, 90d supply, fill #1
  Filled 2024-06-05: qty 90, 90d supply, fill #2

## 2023-12-06 ENCOUNTER — Ambulatory Visit
Admission: RE | Admit: 2023-12-06 | Discharge: 2023-12-06 | Disposition: A | Discharge status: Home / Self Care | Attending: Gastroenterology | Admitting: Gastroenterology

## 2023-12-06 ENCOUNTER — Encounter: Payer: Self-pay | Admitting: Gastroenterology

## 2023-12-06 ENCOUNTER — Ambulatory Visit: Admitting: Anesthesiology

## 2023-12-06 ENCOUNTER — Encounter
Admission: RE | Disposition: A | Discharge status: Home / Self Care | Payer: Self-pay | Source: Home / Self Care | Attending: Gastroenterology

## 2023-12-06 ENCOUNTER — Other Ambulatory Visit: Payer: Self-pay

## 2023-12-06 DIAGNOSIS — K635 Polyp of colon: Secondary | ICD-10-CM | POA: Insufficient documentation

## 2023-12-06 DIAGNOSIS — F17210 Nicotine dependence, cigarettes, uncomplicated: Secondary | ICD-10-CM | POA: Insufficient documentation

## 2023-12-06 DIAGNOSIS — Z1211 Encounter for screening for malignant neoplasm of colon: Secondary | ICD-10-CM | POA: Diagnosis not present

## 2023-12-06 DIAGNOSIS — K219 Gastro-esophageal reflux disease without esophagitis: Secondary | ICD-10-CM | POA: Insufficient documentation

## 2023-12-06 DIAGNOSIS — E119 Type 2 diabetes mellitus without complications: Secondary | ICD-10-CM | POA: Diagnosis not present

## 2023-12-06 DIAGNOSIS — I10 Essential (primary) hypertension: Secondary | ICD-10-CM | POA: Diagnosis not present

## 2023-12-06 DIAGNOSIS — Z7984 Long term (current) use of oral hypoglycemic drugs: Secondary | ICD-10-CM | POA: Insufficient documentation

## 2023-12-06 DIAGNOSIS — E669 Obesity, unspecified: Secondary | ICD-10-CM | POA: Diagnosis not present

## 2023-12-06 DIAGNOSIS — D122 Benign neoplasm of ascending colon: Secondary | ICD-10-CM | POA: Insufficient documentation

## 2023-12-06 DIAGNOSIS — Z6837 Body mass index (BMI) 37.0-37.9, adult: Secondary | ICD-10-CM | POA: Insufficient documentation

## 2023-12-06 DIAGNOSIS — G4730 Sleep apnea, unspecified: Secondary | ICD-10-CM | POA: Insufficient documentation

## 2023-12-06 DIAGNOSIS — Z860101 Personal history of adenomatous and serrated colon polyps: Secondary | ICD-10-CM | POA: Diagnosis not present

## 2023-12-06 HISTORY — PX: COLONOSCOPY: SHX5424

## 2023-12-06 HISTORY — PX: POLYPECTOMY: SHX149

## 2023-12-06 LAB — GLUCOSE, CAPILLARY: Glucose-Capillary: 111 mg/dL — ABNORMAL HIGH (ref 70–99)

## 2023-12-06 SURGERY — COLONOSCOPY
Anesthesia: General

## 2023-12-06 MED ORDER — PROPOFOL 500 MG/50ML IV EMUL
INTRAVENOUS | Status: DC | PRN
  Administered 2023-12-06: 50 ug/kg/min via INTRAVENOUS

## 2023-12-06 MED ORDER — DEXMEDETOMIDINE HCL IN NACL 80 MCG/20ML IV SOLN
INTRAVENOUS | Status: DC | PRN
  Administered 2023-12-06: 20 ug via INTRAVENOUS

## 2023-12-06 MED ORDER — PHENYLEPHRINE 80 MCG/ML (10ML) SYRINGE FOR IV PUSH (FOR BLOOD PRESSURE SUPPORT)
PREFILLED_SYRINGE | INTRAVENOUS | Status: AC
  Filled 2023-12-06: qty 10

## 2023-12-06 MED ORDER — SODIUM CHLORIDE 0.9 % IV SOLN: INTRAVENOUS | Status: DC

## 2023-12-06 MED ORDER — PROPOFOL 10 MG/ML IV BOLUS
INTRAVENOUS | Status: DC | PRN
  Administered 2023-12-06 (×2): 50 mg via INTRAVENOUS

## 2023-12-06 MED ORDER — SIMETHICONE 40 MG/0.6ML PO SUSP
ORAL | Status: DC | PRN
  Administered 2023-12-06: 60 mL

## 2023-12-06 MED ORDER — LIDOCAINE HCL (CARDIAC) PF 100 MG/5ML IV SOSY
PREFILLED_SYRINGE | INTRAVENOUS | Status: DC | PRN
  Administered 2023-12-06: 100 mg via INTRAVENOUS

## 2023-12-06 MED ORDER — LIDOCAINE HCL (PF) 2 % IJ SOLN
INTRAMUSCULAR | Status: AC
Start: 2023-12-06 — End: 2023-12-06
  Filled 2023-12-06: qty 5

## 2023-12-06 NOTE — Anesthesia Preprocedure Evaluation (Addendum)
 Anesthesia Evaluation  Patient identified by MRN, date of birth, ID band Patient awake    Reviewed: Allergy & Precautions, NPO status , Patient's Chart, lab work & pertinent test results  History of Anesthesia Complications Negative for: history of anesthetic complications  Airway Mallampati: II   Neck ROM: Full    Dental  (+) Chipped   Pulmonary sleep apnea and Continuous Positive Airway Pressure Ventilation , Current Smoker (1/2 ppd)Patient did not abstain from smoking.   Pulmonary exam normal breath sounds clear to auscultation       Cardiovascular hypertension, Normal cardiovascular exam Rhythm:Regular Rate:Normal     Neuro/Psych  PSYCHIATRIC DISORDERS  Depression    negative neurological ROS     GI/Hepatic ,GERD  ,,  Endo/Other  diabetes, Type 2  Obesity   Renal/GU      Musculoskeletal   Abdominal   Peds  Hematology negative hematology ROS (+)   Anesthesia Other Findings   Reproductive/Obstetrics                             Anesthesia Physical Anesthesia Plan  ASA: 2  Anesthesia Plan: General   Post-op Pain Management:    Induction: Intravenous  PONV Risk Score and Plan: 1 and Propofol  infusion, TIVA and Treatment may vary due to age or medical condition  Airway Management Planned: Natural Airway  Additional Equipment:   Intra-op Plan:   Post-operative Plan:   Informed Consent: I have reviewed the patients History and Physical, chart, labs and discussed the procedure including the risks, benefits and alternatives for the proposed anesthesia with the patient or authorized representative who has indicated his/her understanding and acceptance.       Plan Discussed with: CRNA  Anesthesia Plan Comments: (LMA/GETA backup discussed.  Patient consented for risks of anesthesia including but not limited to:  - adverse reactions to medications - damage to eyes, teeth, lips  or other oral mucosa - nerve damage due to positioning  - sore throat or hoarseness - damage to heart, brain, nerves, lungs, other parts of body or loss of life  Informed patient about role of CRNA in peri- and intra-operative care.  Patient voiced understanding.)        Anesthesia Quick Evaluation

## 2023-12-06 NOTE — Anesthesia Postprocedure Evaluation (Signed)
 Anesthesia Post Note  Patient: Morrell Fluke  Procedure(s) Performed: COLONOSCOPY POLYPECTOMY, INTESTINE  Patient location during evaluation: PACU Anesthesia Type: General Level of consciousness: awake and alert, oriented and patient cooperative Pain management: pain level controlled Vital Signs Assessment: post-procedure vital signs reviewed and stable Respiratory status: spontaneous breathing, nonlabored ventilation and respiratory function stable Cardiovascular status: blood pressure returned to baseline and stable Postop Assessment: adequate PO intake Anesthetic complications: no   No notable events documented.   Last Vitals:  Vitals:   12/06/23 1338 12/06/23 1348  BP: 113/77 108/86  Pulse: 62 62  Resp: (!) 22 16  Temp:    SpO2: 95% 97%    Last Pain:  Vitals:   12/06/23 1348  TempSrc:   PainSc: 0-No pain                 Alfonso Ruths

## 2023-12-06 NOTE — Op Note (Signed)
 [facility] Gastroenterology Patient Name: Jeffrey [name] Procedure Date: 12/06/2023 11:07 AM MRN: [id number] Account #: [id number] Date of Birth: 1961/01/10 Admit Type: Outpatient Age: 63 Room: [facility] ENDO ROOM 1 Gender: Male Note Status: Finalized Instrument Name: Colonscope [id number] Procedure:             Colonoscopy Indications:           High risk colon cancer surveillance: Personal history                         of colonic polyps Providers:             Elspeth Ozell Onita ROSALEA, DO Referring MD:          Jerona CHARLENA Sayre MD (Referring MD) Medicines:             Monitored Anesthesia Care Complications:         No immediate complications. Estimated blood loss:                         Minimal. Procedure:             Pre-Anesthesia Assessment:                        - Prior to the procedure, a History and Physical was                         performed, and patient medications and allergies were                         reviewed. The patient is competent. The risks and                         benefits of the procedure and the sedation options and                         risks were discussed with the patient. All questions                         were answered and informed consent was obtained.                         Patient identification and proposed procedure were                         verified by the physician, the nurse, the anesthetist                         and the technician in the endoscopy suite. Mental                         Status Examination: alert and oriented. Airway                         Examination: normal oropharyngeal airway and neck                         mobility. Respiratory Examination: clear to  auscultation. CV Examination: RRR, no murmurs, no S3                         or S4. Prophylactic Antibiotics: The patient does not                         require prophylactic antibiotics. Prior                          Anticoagulants: The patient has taken no anticoagulant                         or antiplatelet agents. ASA Grade Assessment: II - A                         patient with mild systemic disease. After reviewing                         the risks and benefits, the patient was deemed in                         satisfactory condition to undergo the procedure. The                         anesthesia plan was to use monitored anesthesia care                         (MAC). Immediately prior to administration of                         medications, the patient was re-assessed for adequacy                         to receive sedatives. The heart rate, respiratory                         rate, oxygen saturations, blood pressure, adequacy of                         pulmonary ventilation, and response to care were                         monitored throughout the procedure. The physical                         status of the patient was re-assessed after the                         procedure.                        After obtaining informed consent, the colonoscope was                         passed under direct vision. Throughout the procedure,                         the patient's blood pressure, pulse, and oxygen  saturations were monitored continuously. The                         Colonoscope was introduced through the anus and                         advanced to the the cecum, identified by appendiceal                         orifice and ileocecal valve. The colonoscopy was                         performed without difficulty. The patient tolerated                         the procedure well. The quality of the bowel                         preparation was evaluated using the BBPS [facility] Bowel                         Preparation Scale) with scores of: Right Colon = 3,                         Transverse Colon = 3 and Left Colon = 3 (entire mucosa                         seen well  with no residual staining, small fragments                         of stool or opaque liquid). The total BBPS score                         equals 9. The ileocecal valve, appendiceal orifice,                         and rectum were photographed. Findings:      The perianal and digital rectal examinations were normal. Pertinent       negatives include normal sphincter tone.      Two sessile polyps were found in the sigmoid colon and transverse colon.       The polyps were 1 to 2 mm in size. These polyps were removed with a       jumbo cold forceps. Resection and retrieval were complete. Estimated       blood loss was minimal.      A 3 to 4 mm polyp was found in the ascending colon. The polyp was       sessile. The polyp was removed with a cold snare. Resection and       retrieval were complete. Estimated blood loss was minimal.      The exam was otherwise without abnormality on direct and retroflexion       views. Impression:            - Two 1 to 2 mm polyps in the sigmoid colon and in the                         transverse colon,  removed with a jumbo cold forceps.                         Resected and retrieved.                        - One 3 to 4 mm polyp in the ascending colon, removed                         with a cold snare. Resected and retrieved.                        - The examination was otherwise normal on direct and                         retroflexion views. Recommendation:        - Patient has a contact number available for                         emergencies. The signs and symptoms of potential                         delayed complications were discussed with the patient.                         Return to normal activities tomorrow. Written                         discharge instructions were provided to the patient.                        - Discharge patient to home.                        - Resume previous diet.                        - Continue present medications.                         - Await pathology results.                        - Repeat colonoscopy for surveillance based on                         pathology results.                        - Return to referring physician as previously                         scheduled.                        - The findings and recommendations were discussed with                         the patient. Procedure Code(s):     --- Professional ---  54614, Colonoscopy, flexible; with removal of                         tumor(s), polyp(s), or other lesion(s) by snare                         technique                        45380, 59, Colonoscopy, flexible; with biopsy, single                         or multiple Diagnosis Code(s):     --- Professional ---                        Z86.010, Personal history of colonic polyps                        D12.5, Benign neoplasm of sigmoid colon                        D12.3, Benign neoplasm of transverse colon (hepatic                         flexure or splenic flexure)                        D12.2, Benign neoplasm of ascending colon CPT copyright 2022 American Medical Association. All rights reserved. The codes documented in this report are preliminary and upon coder review may  be revised to meet current compliance requirements. Attending Participation:      I personally performed the entire procedure. Elspeth Jungling, DO Elspeth Ozell Jungling DO, DO 12/06/2023 1:32:31 PM This report has been signed electronically. Number of Addenda: 0 Note Initiated On: 12/06/2023 11:07 AM Scope Withdrawal Time: 0 hours 11 minutes 24 seconds  Total Procedure Duration: 0 hours 14 minutes 58 seconds  Estimated Blood Loss:  Estimated blood loss was minimal.      [facility]

## 2023-12-06 NOTE — Transfer of Care (Signed)
 Immediate Anesthesia Transfer of Care Note  Patient: Jeffrey [name]  Procedure(s) Performed: COLONOSCOPY POLYPECTOMY, INTESTINE  Patient Location: PACU  Anesthesia Type:General  Level of Consciousness: sedated  Airway & Oxygen Therapy: Patient Spontanous Breathing  Post-op Assessment: Report given to RN and Post -op Vital signs reviewed and stable  Post vital signs: Reviewed and stable  Last Vitals:  Vitals Value Taken Time  BP 106/69 12/06/23 13:28  Temp 36.1 C 12/06/23 13:28  Pulse 59 12/06/23 13:29  Resp 17 12/06/23 13:29  SpO2 96 % 12/06/23 13:29  Vitals shown include unfiled device data.  Last Pain:  Vitals:   12/06/23 1328  TempSrc:   PainSc: Asleep         Complications: No notable events documented.

## 2023-12-06 NOTE — Interval H&P Note (Signed)
 History and Physical Interval Note: Preprocedure H&P from 12/06/23  was reviewed and there was no interval change after seeing and examining the patient.  Written consent was obtained from the patient after discussion of risks, benefits, and alternatives. Patient has consented to proceed with Colonoscopy with possible intervention   12/06/2023 12:56 PM  Jeffrey [name]  has presented today for surgery, with the diagnosis of Personal history of adenomatous and serrated colon polyps (Z86.0101).  The various methods of treatment have been discussed with the patient and family. After consideration of risks, benefits and other options for treatment, the patient has consented to  Procedure(s): COLONOSCOPY (N/A) as a surgical intervention.  The patient's history has been reviewed, patient examined, no change in status, stable for surgery.  I have reviewed the patient's chart and labs.  Questions were answered to the patient's satisfaction.     Elspeth Ozell Jungling

## 2023-12-06 NOTE — H&P (Signed)
 Pre-Procedure H&P   Patient ID: Jeffrey [name] is a 63 y.o. male.  Gastroenterology Provider: Elspeth Ozell Jungling, DO  Referring Provider: Dr. Diedra PCP: Diedra Lame, MD  Date: 12/06/2023  HPI [name] is a 63 y.o. male who presents today for Colonoscopy for Personal history of colon polyps .  Last underwent colonoscopy November 2015 with internal hemorrhoids and 1 adenomatous polyp removed less than 1 cm  Reports twice a day bowel meant without melena or hematochezia.  Status post appendectomy  No family history of colon cancer or colon polyp  Hemoglobin 17.1 MCV 91 platelets 80,000 sodium 150 creatinine 1.1   Past Medical History:  Diagnosis Date   Depression    Diabetes mellitus without complication (HCC)    GERD (gastroesophageal reflux disease)    Herniated disc    HLD (hyperlipidemia)    Hypertension    Morbid obesity (HCC) 10/18/2021   Sleep apnea uses c-pap   Streptococcal cellulitis    Wears glasses     Past Surgical History:  Procedure Laterality Date   ANKLE SURGERY     left fx   APPENDECTOMY     CARPAL TUNNEL RELEASE Right 10/05/2014   Procedure: RIGHT CARPAL TUNNEL RELEASE;  Surgeon: Elsie Mussel, MD;  Location: [facility];  Service: Orthopedics;  Laterality: Right;   COLONOSCOPY     LUMBAR LAMINECTOMY/DECOMPRESSION MICRODISCECTOMY  07/23/2011   Procedure: LUMBAR LAMINECTOMY/DECOMPRESSION MICRODISCECTOMY;  Surgeon: Tanda DELENA Heading, MD;  Location: [facility];  Service: Orthopedics;  Laterality: Right;   ULNAR TUNNEL RELEASE Right 10/05/2014   Procedure: RIGHT CUBITAL TUNNEL RELEASE;  Surgeon: Elsie Mussel, MD;  Location: [facility];  Service: Orthopedics;  Laterality: Right;   UMBILICAL HERNIA REPAIR N/A 10/20/2014   Procedure: HERNIA REPAIR UMBILICAL ADULT;  Surgeon: Unknown Sharps, MD;  Location: [facility];  Service: General;  Laterality: N/A;   VASECTOMY      Family History No h/o GI disease or  malignancy  Review of Systems  Constitutional:  Negative for activity change, appetite change, chills, diaphoresis, fatigue, fever and unexpected weight change.  HENT:  Negative for trouble swallowing and voice change.   Respiratory:  Negative for shortness of breath and wheezing.   Cardiovascular:  Negative for chest pain, palpitations and leg swelling.  Gastrointestinal:  Negative for abdominal distention, abdominal pain, anal bleeding, blood in stool, constipation, diarrhea, nausea and vomiting.  Musculoskeletal:  Negative for arthralgias and myalgias.  Skin:  Negative for color change and pallor.  Neurological:  Negative for dizziness, syncope and weakness.  Psychiatric/Behavioral:  Negative for confusion. The patient is not nervous/anxious.   All other systems reviewed and are negative.    Medications No current facility-administered medications on file prior to encounter.   Current Outpatient Medications on File Prior to Encounter  Medication Sig Dispense Refill   albuterol  (VENTOLIN  HFA) 108 (90 Base) MCG/ACT inhaler Inhale 1-2 puffs into the lungs every 6 (six) hours as needed for wheezing or shortness of breath. 18 g 0   atorvastatin  (LIPITOR) 10 MG tablet Take 1 tablet (10 mg total) by mouth once daily 90 tablet 1   canagliflozin  (INVOKANA ) 300 MG TABS tablet Take 1 tablet by mouth daily.     canagliflozin  (INVOKANA ) 300 MG TABS tablet Take 1 tablet (300 mg total) by mouth daily. 90 tablet 1   lisinopril  (PRINIVIL ,ZESTRIL ) 20 MG tablet Take 20 mg by mouth daily.     meloxicam  (MOBIC ) 15 MG tablet Take 1 tablet (  15 mg total) by mouth once daily 30 tablet 0   meloxicam  (MOBIC ) 15 MG tablet Take 1 tablet (15 mg total) by mouth daily as needed. 30 tablet 0   omeprazole  (PRILOSEC) 20 MG capsule TAKE 1 CAPSULE BY MOUTH ONCE A DAY 90 capsule 3   omeprazole  (PRILOSEC) 20 MG capsule Take 1 capsule (20 mg total) by mouth daily. 90 capsule 1   Blood Glucose Monitoring Suppl (FREESTYLE  FREEDOM LITE) w/Device KIT Use as directed to check blood sugar 2 (two) times daily. 1 kit 0   doxycycline  (VIBRAMYCIN ) 100 MG capsule Take 1 capsule (100 mg total) by mouth 2 (two) times daily. (Patient not taking: Reported on 12/06/2023) 20 capsule 0   glucose blood (ACCU-CHEK GUIDE TEST) test strip Use 2 (two) times daily. 200 strip 3   HYDROcodone -acetaminophen  (NORCO/VICODIN) 5-325 MG tablet Take 1 tablet by mouth every 6 (six) hours as needed for moderate pain.     Lancets (FREESTYLE) lancets Use as directed to check blood sugar 2 (two) times daily. 100 each 3   lisinopril  (ZESTRIL ) 20 MG tablet Take 1 tablet (20 mg total) by mouth daily. 90 tablet 1   sildenafil  (REVATIO ) 20 MG tablet Take 3 tablets (60 mg total) by mouth daily as needed. 60 tablet 1    Pertinent medications related to GI and procedure were reviewed by me with the patient prior to the procedure   Current Facility-Administered Medications:    0.9 %  sodium chloride  infusion, , Intravenous, Continuous, Onita Elspeth Sharper, DO, Last Rate: 20 mL/hr at 12/06/23 1204, New Bag at 12/06/23 1204  sodium chloride  20 mL/hr at 12/06/23 1204       Allergies  Allergen Reactions   Penicillin G Anaphylaxis   Penicillins Anaphylaxis   Bupropion Other (See Comments)    Mental status change.   Escitalopram Oxalate Other (See Comments)    Delayed ejaculation.   Allergies were reviewed by me prior to the procedure  Objective   Body mass index is 37.13 kg/m. Vitals:   12/06/23 1155  BP: 131/87  Pulse: 71  Resp: 18  Temp: (!) 97.4 F (36.3 C)  TempSrc: Temporal  SpO2: 96%  Weight: 117.4 kg  Height: 5' 10 (1.778 m)     Physical Exam Vitals and nursing note reviewed.  Constitutional:      General: He is not in acute distress.    Appearance: Normal appearance. He is obese. He is not ill-appearing, toxic-appearing or diaphoretic.  HENT:     Head: Normocephalic and atraumatic.     Nose: Nose normal.      Mouth/Throat:     Mouth: Mucous membranes are moist.     Pharynx: Oropharynx is clear.   Eyes:     General: No scleral icterus.    Extraocular Movements: Extraocular movements intact.    Cardiovascular:     Rate and Rhythm: Normal rate and regular rhythm.     Heart sounds: Normal heart sounds. No murmur heard.    No friction rub. No gallop.  Pulmonary:     Effort: Pulmonary effort is normal. No respiratory distress.     Breath sounds: Normal breath sounds. No wheezing, rhonchi or rales.  Abdominal:     General: Bowel sounds are normal. There is no distension.     Palpations: Abdomen is soft.     Tenderness: There is no abdominal tenderness. There is no guarding or rebound.   Musculoskeletal:     Cervical back: Neck supple.  Right lower leg: No edema.     Left lower leg: No edema.   Skin:    General: Skin is warm and dry.     Coloration: Skin is not jaundiced or pale.   Neurological:     General: No focal deficit present.     Mental Status: He is alert and oriented to person, place, and time. Mental status is at baseline.   Psychiatric:        Mood and Affect: Mood normal.        Behavior: Behavior normal.        Thought Content: Thought content normal.        Judgment: Judgment normal.      Assessment:  [name] is a 64 y.o. male  who presents today for Colonoscopy for Personal history of colon polyps .  Plan:  Colonoscopy with possible intervention today  Colonoscopy with possible biopsy, control of bleeding, polypectomy, and interventions as necessary has been discussed with the patient/patient representative. Informed consent was obtained from the patient/patient representative after explaining the indication, nature, and risks of the procedure including but not limited to death, bleeding, perforation, missed neoplasm/lesions, cardiorespiratory compromise, and reaction to medications. Opportunity for questions was given and appropriate answers  were provided. Patient/patient representative has verbalized understanding is amenable to undergoing the procedure.   Elspeth Ozell Jungling, DO  [facility]  Portions of the record may have been created with voice recognition software. Occasional wrong-word or 'sound-a-like' substitutions may have occurred due to the inherent limitations of voice recognition software.  Read the chart carefully and recognize, using context, where substitutions may have occurred.

## 2023-12-07 LAB — SURGICAL PATHOLOGY

## 2023-12-10 ENCOUNTER — Other Ambulatory Visit: Payer: Self-pay

## 2023-12-13 ENCOUNTER — Other Ambulatory Visit: Payer: Self-pay

## 2023-12-13 MED ORDER — OMEPRAZOLE 20 MG PO CPDR
20.0000 mg | DELAYED_RELEASE_CAPSULE | Freq: Every day | ORAL | 1 refills | Status: DC
  Filled 2023-12-13: qty 90, 90d supply, fill #0
  Filled 2024-02-29: qty 90, 90d supply, fill #1

## 2023-12-13 MED ORDER — LISINOPRIL 20 MG PO TABS
20.0000 mg | ORAL_TABLET | Freq: Every day | ORAL | 1 refills | Status: DC
  Filled 2023-12-13: qty 90, 90d supply, fill #0
  Filled 2024-02-29: qty 90, 90d supply, fill #1

## 2024-02-09 ENCOUNTER — Ambulatory Visit: Admitting: Family Medicine

## 2024-02-09 ENCOUNTER — Other Ambulatory Visit: Payer: Self-pay

## 2024-02-09 ENCOUNTER — Encounter: Payer: Self-pay | Admitting: Family Medicine

## 2024-02-09 VITALS — BP 140/82 | HR 92 | Temp 98.6°F | Ht 70.0 in | Wt 261.6 lb

## 2024-02-09 DIAGNOSIS — Z7689 Persons encountering health services in other specified circumstances: Secondary | ICD-10-CM | POA: Insufficient documentation

## 2024-02-09 DIAGNOSIS — I10 Essential (primary) hypertension: Secondary | ICD-10-CM

## 2024-02-09 DIAGNOSIS — F17210 Nicotine dependence, cigarettes, uncomplicated: Secondary | ICD-10-CM | POA: Diagnosis not present

## 2024-02-09 DIAGNOSIS — G4733 Obstructive sleep apnea (adult) (pediatric): Secondary | ICD-10-CM | POA: Insufficient documentation

## 2024-02-09 DIAGNOSIS — E785 Hyperlipidemia, unspecified: Secondary | ICD-10-CM | POA: Diagnosis not present

## 2024-02-09 DIAGNOSIS — E1165 Type 2 diabetes mellitus with hyperglycemia: Secondary | ICD-10-CM | POA: Diagnosis not present

## 2024-02-09 DIAGNOSIS — J069 Acute upper respiratory infection, unspecified: Secondary | ICD-10-CM | POA: Diagnosis not present

## 2024-02-09 MED ORDER — ALBUTEROL SULFATE HFA 108 (90 BASE) MCG/ACT IN AERS
1.0000 | INHALATION_SPRAY | Freq: Four times a day (QID) | RESPIRATORY_TRACT | 0 refills | Status: AC | PRN
  Filled 2024-02-09: qty 18, 25d supply, fill #0
  Filled 2024-03-01: qty 6.7, 25d supply, fill #0

## 2024-02-09 NOTE — Assessment & Plan Note (Signed)
 Last A1c 7.4%. Continue Invokana  and consider GLP.  Recommend heart healthy diet such as Mediterranean diet with whole grains, fruits, vegetable, fish, lean meats, nuts, and olive oil. Limit salt. Encouraged moderate walking, 3-5 times/week for 30-50 minutes each session. Aim for at least 150 minutes.week. Goal should be pace of 3 miles/hours, or walking 1.5 miles in 30 minutes. Seek medical care for urinary frequency, extreme thirst, vision changes, lightheadedness, dizziness.  Follow up recehck labs and OV in 1 month

## 2024-02-09 NOTE — Assessment & Plan Note (Signed)
 BP today 140/82, ongoing viral illness. Will monitor at home and report if sustains higher than goal <130/80. Continue Lisinopril  20mg  daily. Follow up here in 1 month with labs week prior. Recommend heart healthy diet such as Mediterranean diet with whole grains, fruits, vegetable, fish, lean meats, nuts, and olive oil. Limit salt. Encouraged moderate walking, 3-5 times/week for 30-50 minutes each session. Aim for at least 150 minutes.week. Goal should be pace of 3 miles/hours, or walking 1.5 miles in 30 minutes. Avoid tobacco products. Avoid excess alcohol. Take medications as prescribed and bring medications and blood pressure log with cuff to each office visit. Seek medical care for chest pain, palpitations, shortness of breath with exertion, dizziness/lightheadedness, vision changes, recurrent headaches, or swelling of extremities.

## 2024-02-09 NOTE — Assessment & Plan Note (Signed)

## 2024-02-09 NOTE — Patient Instructions (Signed)
 It was great to meet you today and I'm excited to have you join the [facility] practice. I hope you had a positive experience today! If you feel so inclined, please feel free to recommend our practice to friends and family. Jeoffrey Barrio, FNP-C

## 2024-02-09 NOTE — Assessment & Plan Note (Signed)
 compliant

## 2024-02-09 NOTE — Assessment & Plan Note (Signed)
 3-5 minute discussion regarding the harms of tobacco use, the benefits of cessation, and methods of cessation. Discussed that there are medication options to help with cessation. Provided printed education on steps to quit smoking. Patient is willing to try a medication to help.

## 2024-02-09 NOTE — Assessment & Plan Note (Signed)
 Continue atorvastatin , recheck labs in 1 month. I recommend consuming a heart healthy diet such as Mediterranean diet or DASH diet with whole grains, fruits, vegetable, fish, lean meats, nuts, and olive oil. Limit sweets and processed foods. I also encourage moderate intensity exercise 150 minutes weekly. This is 3-5 times weekly for 30-50 minutes each session. Goal should be pace of 3 miles/hours, or walking 1.5 miles in 30 minutes. The ASCVD Risk score (Arnett DK, et al., 2019) failed to calculate for the following reasons:   Cannot find a previous HDL lab   Cannot find a previous total cholesterol lab

## 2024-02-09 NOTE — Assessment & Plan Note (Signed)
 Counseled on importance of weight management for overall health. Encouraged low calorie, heart healthy diet and moderate intensity exercise 150 minutes weekly. This is 3-5 times weekly for 30-50 minutes each session. Goal should be pace of 3 miles/hours, or walking 1.5 miles in 30 minutes and include strength training. Discussed risks of obesity.

## 2024-02-09 NOTE — Progress Notes (Signed)
 New Patient Office Visit  Subjective    Patient ID: Jeffrey [name], male    DOB: 1960/09/10  Age: 63 y.o. MRN: [id number]  CC:  Chief Complaint  Patient presents with   Establish Care    Coughing and sneezing since Friday     HPI Jeffrey [name] presents to establish care. Oriented to practice routines and expectations. Has been seeing PCP regularly. CPE 05/2023. PMH includes HLD, DM2, HTN, thrombocytopenia, OSA on CPAP.  Colon CA screening: colonoscopy 0 years ago without abnormalities. Repeat 5 years.  Prostate CA screening: Prostate cancer screening and PSA options (with potential risks and benefits of testing vs. not testing) were discussed along with recent recs/guidelines.  Tobacco: smoker  (0.5 ppd x 40 yrs) Vaccines: UTD  DM: taking invokana , last A1c 7.4%, admits to stress eating Denies polyuria, polydypsia, chest pain, paresthesias, or chest pain.  HLD: Atorvastatin , last LDL 109, no chest pain  HTN: well controlled on lisinopril , high today due to illness; denis chest pain, palpitations, recurrent headaches, vision changes, lightheadedness, dizziness, dyspnea on exertion, or swelling of extremities.  GERD: well controlled  Low testosterone :  Cigarette nicotine  dependence: 0.5ppd, has tried wellbutrin and chantix with side effects  OSA on CPAP: compliant  Concerns include viral illness. Endorses cough and congestion for 5 days. His wife is also sick. Negative respiratory viral panel. His symptoms have been well controlled with PRN albuterol  and tessalon  perles. Symptoms overall improving. Denies SOB, wheezing, malaise, fever, pleurisy.      Outpatient Encounter Medications as of 02/09/2024  Medication Sig   atorvastatin  (LIPITOR) 10 MG tablet Take 1 tablet (10 mg total) by mouth once daily   Blood Glucose Monitoring Suppl (FREESTYLE FREEDOM LITE) w/Device KIT Use as directed to check blood sugar 2 (two) times daily.   canagliflozin  (INVOKANA ) 300 MG  TABS tablet Take 1 tablet (300 mg total) by mouth daily.   glucose blood (ACCU-CHEK GUIDE TEST) test strip Use 2 (two) times daily.   lisinopril  (ZESTRIL ) 20 MG tablet Take 1 tablet (20 mg total) by mouth daily.   meloxicam  (MOBIC ) 15 MG tablet Take 1 tablet (15 mg total) by mouth daily as needed.   omeprazole  (PRILOSEC) 20 MG capsule Take 1 capsule (20 mg total) by mouth daily.   sildenafil  (REVATIO ) 20 MG tablet Take 3 tablets (60 mg total) by mouth daily as needed.   [DISCONTINUED] albuterol  (VENTOLIN  HFA) 108 (90 Base) MCG/ACT inhaler Inhale 1-2 puffs into the lungs every 6 (six) hours as needed for wheezing or shortness of breath.   albuterol  (VENTOLIN  HFA) 108 (90 Base) MCG/ACT inhaler Inhale 1-2 puffs into the lungs every 6 (six) hours as needed for wheezing or shortness of breath.   Lancets (FREESTYLE) lancets Use as directed to check blood sugar 2 (two) times daily.   [DISCONTINUED] atorvastatin  (LIPITOR) 10 MG tablet Take 1 tablet (10 mg total) by mouth once daily (Patient not taking: Reported on 02/09/2024)   [DISCONTINUED] canagliflozin  (INVOKANA ) 300 MG TABS tablet Take 1 tablet by mouth daily. (Patient not taking: Reported on 02/09/2024)   [DISCONTINUED] doxycycline  (VIBRAMYCIN ) 100 MG capsule Take 1 capsule (100 mg total) by mouth 2 (two) times daily. (Patient not taking: Reported on 02/09/2024)   [DISCONTINUED] HYDROcodone -acetaminophen  (NORCO/VICODIN) 5-325 MG tablet Take 1 tablet by mouth every 6 (six) hours as needed for moderate pain. (Patient not taking: Reported on 02/09/2024)   [DISCONTINUED] lisinopril  (PRINIVIL ,ZESTRIL ) 20 MG tablet Take 20 mg by mouth daily. (Patient not taking: Reported on  02/09/2024)   [DISCONTINUED] meloxicam  (MOBIC ) 15 MG tablet Take 1 tablet (15 mg total) by mouth once daily (Patient not taking: Reported on 02/09/2024)   [DISCONTINUED] omeprazole  (PRILOSEC) 20 MG capsule TAKE 1 CAPSULE BY MOUTH ONCE A DAY (Patient not taking: Reported on 02/09/2024)   No  facility-administered encounter medications on file as of 02/09/2024.    Past Medical History:  Diagnosis Date   Allergy    PCN - Welbutrin   Asthma    hx of   Depression    Diabetes mellitus without complication (HCC)    GERD (gastroesophageal reflux disease)    Herniated disc    HLD (hyperlipidemia)    Hypertension    Morbid obesity (HCC) 10/18/2021   Sleep apnea uses c-pap   Streptococcal cellulitis    Wears glasses     Past Surgical History:  Procedure Laterality Date   ANKLE SURGERY     left fx   APPENDECTOMY     CARPAL TUNNEL RELEASE Right 10/05/2014   Procedure: RIGHT CARPAL TUNNEL RELEASE;  Surgeon: Elsie Mussel, MD;  Location: [facility];  Service: Orthopedics;  Laterality: Right;   COLONOSCOPY     COLONOSCOPY N/A 12/06/2023   Procedure: COLONOSCOPY;  Surgeon: Onita Elspeth Sharper, DO;  Location: [facility];  Service: Gastroenterology;  Laterality: N/A;   HERNIA REPAIR     umbilical   LUMBAR LAMINECTOMY/DECOMPRESSION MICRODISCECTOMY  07/23/2011   Procedure: LUMBAR LAMINECTOMY/DECOMPRESSION MICRODISCECTOMY;  Surgeon: Tanda DELENA Heading, MD;  Location: [facility];  Service: Orthopedics;  Laterality: Right;   POLYPECTOMY  12/06/2023   Procedure: POLYPECTOMY, INTESTINE;  Surgeon: Onita Elspeth Sharper, DO;  Location: [facility] ENDOSCOPY;  Service: Gastroenterology;;   SPINE SURGERY     l -spine disc repair   ULNAR TUNNEL RELEASE Right 10/05/2014   Procedure: RIGHT CUBITAL TUNNEL RELEASE;  Surgeon: Elsie Mussel, MD;  Location: [facility];  Service: Orthopedics;  Laterality: Right;   UMBILICAL HERNIA REPAIR N/A 10/20/2014   Procedure: HERNIA REPAIR UMBILICAL ADULT;  Surgeon: Unknown Sharps, MD;  Location: [facility];  Service: General;  Laterality: N/A;   VASECTOMY      Family History  Problem Relation Age of Onset   Diabetes Mother    Arthritis Mother    Asthma Mother    Hearing loss Mother    Hypertension Father    Hyperlipidemia Father      Social History   Socioeconomic History   Marital status: Married    Spouse name: Not on file   Number of children: Not on file   Years of education: Not on file   Highest education level: Associate degree: occupational, Scientist, product/process development, or vocational program  Occupational History   Not on file  Tobacco Use   Smoking status: Every Day    Current packs/day: 0.25    Average packs/day: 0.3 packs/day for 52.4 years (17.5 ttl pk-yrs)    Types: Cigarettes   Smokeless tobacco: Never  Vaping Use   Vaping status: Never Used  Substance and Sexual Activity   Alcohol use: Not Currently    Alcohol/week: 2.0 - 3.0 standard drinks of alcohol    Types: 2 Cans of beer per week    Comment: rare occasion 1-2 drinks   Drug use: No   Sexual activity: Not Currently    Birth control/protection: None    Comment: perimenopausal spouse  Other Topics Concern   Not on file  Social History Narrative   Not on file   Social Drivers of Health  Financial Resource Strain: Low Risk  (02/07/2024)   Overall Financial Resource Strain (CARDIA)    Difficulty of Paying Living Expenses: Not hard at all  Food Insecurity: No Food Insecurity (02/07/2024)   Hunger Vital Sign    Worried About Running Out of Food in the Last Year: Never true    Ran Out of Food in the Last Year: Never true  Transportation Needs: No Transportation Needs (02/07/2024)   PRAPARE - Administrator, Civil Service (Medical): No    Lack of Transportation (Non-Medical): No  Physical Activity: Sufficiently Active (02/07/2024)   Exercise Vital Sign    Days of Exercise per Week: 2 days    Minutes of Exercise per Session: 120 min  Stress: Stress Concern Present (02/07/2024)   [employer] of Occupational Health - Occupational Stress Questionnaire    Feeling of Stress: To some extent  Social Connections: Socially Integrated (02/07/2024)   Social Connection and Isolation Panel    Frequency of Communication with Friends and Family:  More than three times a week    Frequency of Social Gatherings with Friends and Family: Once a week    Attends Religious Services: More than 4 times per year    Active Member of [employer] or Organizations: Yes    Attends Engineer, structural: More than 4 times per year    Marital Status: Married  Catering manager Violence: Not on file    Review of Systems  All other systems reviewed and are negative.       Objective    BP (!) 140/82   Pulse 92   Temp 98.6 F (37 C)   Ht 5' 10 (1.778 m)   Wt 261 lb 9.6 oz (118.7 kg)   SpO2 93%   BMI 37.54 kg/m   Physical Exam Vitals and nursing note reviewed.  Constitutional:      Appearance: Normal appearance. He is normal weight.  HENT:     Head: Normocephalic and atraumatic.  Cardiovascular:     Rate and Rhythm: Normal rate and regular rhythm.     Pulses: Normal pulses.     Heart sounds: Normal heart sounds.  Pulmonary:     Effort: Pulmonary effort is normal.     Breath sounds: Rhonchi present.  Skin:    General: Skin is warm and dry.     Capillary Refill: Capillary refill takes less than 2 seconds.  Neurological:     General: No focal deficit present.     Mental Status: He is alert and oriented to person, place, and time. Mental status is at baseline.  Psychiatric:        Mood and Affect: Mood normal.        Behavior: Behavior normal.        Thought Content: Thought content normal.        Judgment: Judgment normal.         Assessment & Plan:   Problem List Items Addressed This Visit     Essential hypertension   BP today 140/82, ongoing viral illness. Will monitor at home and report if sustains higher than goal <130/80. Continue Lisinopril  20mg  daily. Follow up here in 1 month with labs week prior. Recommend heart healthy diet such as Mediterranean diet with whole grains, fruits, vegetable, fish, lean meats, nuts, and olive oil. Limit salt. Encouraged moderate walking, 3-5 times/week for 30-50 minutes each  session. Aim for at least 150 minutes.week. Goal should be pace of 3 miles/hours, or walking 1.5 miles  in 30 minutes. Avoid tobacco products. Avoid excess alcohol. Take medications as prescribed and bring medications and blood pressure log with cuff to each office visit. Seek medical care for chest pain, palpitations, shortness of breath with exertion, dizziness/lightheadedness, vision changes, recurrent headaches, or swelling of extremities.       Relevant Orders   CBC with Differential/Platelet   Comprehensive metabolic panel with GFR   Hemoglobin A1c   Lipid panel   VITAMIN D 25 Hydroxy (Vit-D Deficiency, Fractures)   Controlled type 2 diabetes mellitus with hyperglycemia (HCC)   Last A1c 7.4%. Continue Invokana  and consider GLP.  Recommend heart healthy diet such as Mediterranean diet with whole grains, fruits, vegetable, fish, lean meats, nuts, and olive oil. Limit salt. Encouraged moderate walking, 3-5 times/week for 30-50 minutes each session. Aim for at least 150 minutes.week. Goal should be pace of 3 miles/hours, or walking 1.5 miles in 30 minutes. Seek medical care for urinary frequency, extreme thirst, vision changes, lightheadedness, dizziness.  Follow up recehck labs and OV in 1 month      Relevant Orders   CBC with Differential/Platelet   Comprehensive metabolic panel with GFR   Hemoglobin A1c   Lipid panel   VITAMIN D 25 Hydroxy (Vit-D Deficiency, Fractures)   Microalbumin / creatinine urine ratio   HLD (hyperlipidemia)   Continue atorvastatin , recheck labs in 1 month. I recommend consuming a heart healthy diet such as Mediterranean diet or DASH diet with whole grains, fruits, vegetable, fish, lean meats, nuts, and olive oil. Limit sweets and processed foods. I also encourage moderate intensity exercise 150 minutes weekly. This is 3-5 times weekly for 30-50 minutes each session. Goal should be pace of 3 miles/hours, or walking 1.5 miles in 30 minutes. The ASCVD Risk score  (Arnett DK, et al., 2019) failed to calculate for the following reasons:   Cannot find a previous HDL lab   Cannot find a previous total cholesterol lab       Relevant Orders   CBC with Differential/Platelet   Comprehensive metabolic panel with GFR   Hemoglobin A1c   Lipid panel   VITAMIN D 25 Hydroxy (Vit-D Deficiency, Fractures)   Morbid obesity (HCC)   Counseled on importance of weight management for overall health. Encouraged low calorie, heart healthy diet and moderate intensity exercise 150 minutes weekly. This is 3-5 times weekly for 30-50 minutes each session. Goal should be pace of 3 miles/hours, or walking 1.5 miles in 30 minutes and include strength training. Discussed risks of obesity.       Viral URI with cough   Reassured patient that symptoms and exam findings are most consistent with a viral upper respiratory infection and explained lack of efficacy of antibiotics against viruses.  Discussed expected course and features suggestive of secondary bacterial infection.  Continue supportive care. Increase fluid intake with water  or electrolyte solution like pedialyte. Encouraged acetaminophen  as needed for fever/pain. Encouraged salt water  gargling, chloraseptic spray and throat lozenges. Encouraged OTC guaifenesin. Encouraged saline sinus flushes and/or neti with humidified air.        Encounter to establish care with new provider - Primary   Today we reviewed your medical history, HM items, and current concerns. Will follow up on medical conditions in 1 month. Great to meet  you today!      Cigarette nicotine  dependence without complication   3-5 minute discussion regarding the harms of tobacco use, the benefits of cessation, and methods of cessation. Discussed that there are medication options to  help with cessation. Provided printed education on steps to quit smoking. Patient is willing to try a medication to help.       OSA on CPAP   compliant       Return in about  4 weeks (around 03/08/2024) for chronic follow-up with labs 1 week prior.   Jeffrey GORMAN Barrio, FNP

## 2024-02-09 NOTE — Assessment & Plan Note (Signed)
 Today we reviewed your medical history, HM items, and current concerns. Will follow up on medical conditions in 1 month. Great to meet  you today!

## 2024-02-21 ENCOUNTER — Other Ambulatory Visit: Payer: Self-pay

## 2024-02-29 ENCOUNTER — Other Ambulatory Visit: Payer: Self-pay

## 2024-02-29 ENCOUNTER — Encounter: Payer: Self-pay | Admitting: Family Medicine

## 2024-02-29 MED ORDER — MELOXICAM 15 MG PO TABS
15.0000 mg | ORAL_TABLET | Freq: Every day | ORAL | 0 refills | Status: AC | PRN
  Filled 2024-02-29: qty 30, 30d supply, fill #0

## 2024-02-29 MED ORDER — SILDENAFIL CITRATE 20 MG PO TABS
60.0000 mg | ORAL_TABLET | Freq: Every day | ORAL | 1 refills | Status: AC | PRN
  Filled 2024-02-29: qty 60, 20d supply, fill #0
  Filled 2024-06-05: qty 60, 20d supply, fill #1

## 2024-03-01 ENCOUNTER — Other Ambulatory Visit: Payer: Self-pay

## 2024-03-16 ENCOUNTER — Other Ambulatory Visit

## 2024-03-16 DIAGNOSIS — I10 Essential (primary) hypertension: Secondary | ICD-10-CM

## 2024-03-16 DIAGNOSIS — E1165 Type 2 diabetes mellitus with hyperglycemia: Secondary | ICD-10-CM

## 2024-03-16 DIAGNOSIS — E785 Hyperlipidemia, unspecified: Secondary | ICD-10-CM

## 2024-03-17 ENCOUNTER — Encounter: Payer: Self-pay | Admitting: Family Medicine

## 2024-03-17 LAB — LIPID PANEL
Cholesterol: 147 mg/dL (ref <200)
HDL: 42 mg/dL (ref >=40)
LDL Cholesterol (Calc): 87 mg/dL
Non-HDL Cholesterol (Calc): 105 mg/dL (ref <130)
Total CHOL/HDL Ratio: 3.5 (calc) (ref <5.0)
Triglycerides: 87 mg/dL (ref <150)

## 2024-03-17 LAB — CBC WITH DIFFERENTIAL/PLATELET
Absolute Lymphocytes: 1907 {cells}/uL (ref 850–3900)
Absolute Monocytes: 544 {cells}/uL (ref 200–950)
Basophils Absolute: 32 {cells}/uL (ref 0–200)
Basophils Relative: 0.5 %
Eosinophils Absolute: 102 {cells}/uL (ref 15–500)
Eosinophils Relative: 1.6 %
HCT: 48.5 % (ref 38.5–50.0)
Hemoglobin: 16.5 g/dL (ref 13.2–17.1)
MCH: 31.4 pg (ref 27.0–33.0)
MCHC: 34 g/dL (ref 32.0–36.0)
MCV: 92.4 fL (ref 80.0–100.0)
MPV: 10.7 fL (ref 7.5–12.5)
Monocytes Relative: 8.5 %
Neutro Abs: 3814 {cells}/uL (ref 1500–7800)
Neutrophils Relative %: 59.6 %
Platelets: 102 Thousand/uL — ABNORMAL LOW (ref 140–400)
RBC: 5.25 Million/uL (ref 4.20–5.80)
RDW: 13 % (ref 11.0–15.0)
Total Lymphocyte: 29.8 %
WBC: 6.4 Thousand/uL (ref 3.8–10.8)

## 2024-03-17 LAB — COMPREHENSIVE METABOLIC PANEL WITH GFR
AG Ratio: 2 (calc) (ref 1.0–2.5)
ALT: 27 U/L (ref 9–46)
AST: 17 U/L (ref 10–35)
Albumin: 4.2 g/dL (ref 3.6–5.1)
Alkaline phosphatase (APISO): 66 U/L (ref 35–144)
BUN: 14 mg/dL (ref 7–25)
CO2: 27 mmol/L (ref 20–32)
Calcium: 9.3 mg/dL (ref 8.6–10.3)
Chloride: 105 mmol/L (ref 98–110)
Creat: 0.99 mg/dL (ref 0.70–1.35)
Globulin: 2.1 g/dL (ref 1.9–3.7)
Glucose, Bld: 145 mg/dL — ABNORMAL HIGH (ref 65–99)
Potassium: 4.3 mmol/L (ref 3.5–5.3)
Sodium: 140 mmol/L (ref 135–146)
Total Bilirubin: 0.7 mg/dL (ref 0.2–1.2)
Total Protein: 6.3 g/dL (ref 6.1–8.1)
eGFR: 86 mL/min/1.73m2 (ref >=60)

## 2024-03-17 LAB — HEMOGLOBIN A1C
Hgb A1c MFr Bld: 7.2 % — ABNORMAL HIGH (ref <5.7)
Mean Plasma Glucose: 160 mg/dL
eAG (mmol/L): 8.9 mmol/L

## 2024-03-17 LAB — MICROALBUMIN / CREATININE URINE RATIO
Creatinine, Urine: 89 mg/dL (ref 20–320)
Microalb Creat Ratio: 3 mg/g{creat} (ref <30)
Microalb, Ur: 0.3 mg/dL

## 2024-03-17 LAB — VITAMIN D 25 HYDROXY (VIT D DEFICIENCY, FRACTURES): Vit D, 25-Hydroxy: 41 ng/mL (ref 30–100)

## 2024-03-20 ENCOUNTER — Ambulatory Visit: Admitting: Family Medicine

## 2024-03-20 ENCOUNTER — Encounter: Payer: Self-pay | Admitting: Family Medicine

## 2024-03-20 VITALS — BP 127/83 | HR 74 | Temp 97.8°F | Ht 70.0 in | Wt 260.0 lb

## 2024-03-20 DIAGNOSIS — E782 Mixed hyperlipidemia: Secondary | ICD-10-CM | POA: Diagnosis not present

## 2024-03-20 DIAGNOSIS — Z7984 Long term (current) use of oral hypoglycemic drugs: Secondary | ICD-10-CM | POA: Diagnosis not present

## 2024-03-20 DIAGNOSIS — E1165 Type 2 diabetes mellitus with hyperglycemia: Secondary | ICD-10-CM | POA: Diagnosis not present

## 2024-03-20 DIAGNOSIS — I10 Essential (primary) hypertension: Secondary | ICD-10-CM | POA: Diagnosis not present

## 2024-03-20 NOTE — Assessment & Plan Note (Signed)
 Continue atorvastatin . Would like to work on incorporating exercise and working on lifestyle changes prior to intensifying. I recommend consuming a heart healthy diet such as Mediterranean diet or DASH diet with whole grains, fruits, vegetable, fish, lean meats, nuts, and olive oil. Limit sweets and processed foods. I also encourage moderate intensity exercise 150 minutes weekly. This is 3-5 times weekly for 30-50 minutes each session. Goal should be pace of 3 miles/hours, or walking 1.5 miles in 30 minutes. The 10-year ASCVD risk score (Arnett DK, et al., 2019) is: 28.6% Follow up in 3 months or sooner if needed.

## 2024-03-20 NOTE — Assessment & Plan Note (Signed)
 Continue Lisinopril  20mg  daily. Notify office if sustains >130/80 at home. Recommend heart healthy diet such as Mediterranean diet with whole grains, fruits, vegetable, fish, lean meats, nuts, and olive oil. Limit salt. Encouraged moderate walking, 3-5 times/week for 30-50 minutes each session. Aim for at least 150 minutes.week. Goal should be pace of 3 miles/hours, or walking 1.5 miles in 30 minutes. Avoid tobacco products. Avoid excess alcohol. Take medications as prescribed and bring medications and blood pressure log with cuff to each office visit. Seek medical care for chest pain, palpitations, shortness of breath with exertion, dizziness/lightheadedness, vision changes, recurrent headaches, or swelling of extremities. Follow up in 3 months or sooner if needed

## 2024-03-20 NOTE — Assessment & Plan Note (Addendum)
 A1c 7.2%. Continue Invokana  and consider GLP.  Would like to work on incorporating exercise and lifestyle changes prior to adding another medication.  Recommend heart healthy diet such as Mediterranean diet with whole grains, fruits, vegetable, fish, lean meats, nuts, and olive oil. Limit salt. Encouraged moderate walking, 3-5 times/week for 30-50 minutes each session. Aim for at least 150 minutes.week. Goal should be pace of 3 miles/hours, or walking 1.5 miles in 30 minutes. Seek medical care for urinary frequency, extreme thirst, vision changes, lightheadedness, dizziness.  Follow up recehck labs and OV in 3 months

## 2024-03-20 NOTE — Progress Notes (Signed)
 Subjective:  HPI: Jeffrey [name] is a 63 y.o. male presenting on 03/20/2024 for Medical Management of Chronic Issues (4 wk f/u would like to review labs, concerned about A1C )   HPI Patient is in today for follow up for chronic condition management. Has been working on improving his diet. Is not sedentary however admits he does not exercise regularly.   HTN: on Lisinopril  20mg  daily Denies chest pain, palpitations, recurrent headaches, vision changes, lightheadedness, dizziness, dyspnea on exertion, or swelling of extremities.  DM2: A1c 7.2% on Invokana  300mg  daily Denies polyuria, polydypsia, chest pain, paresthesias, or chest pain. Lab Results  Component Value Date   HGBA1C 7.2 (H) 03/16/2024   HGBA1C 6.5 (H) 10/18/2021    HLD: on Atorvastatin  10mg  daily, not at goal LDL<70 Lipid Panel     Component Value Date/Time   CHOL 147 03/16/2024 0858   TRIG 87 03/16/2024 0858   HDL 42 03/16/2024 0858   CHOLHDL 3.5 03/16/2024 0858   LDLCALC 87 03/16/2024 0858     Review of Systems  All other systems reviewed and are negative.   Relevant past medical history reviewed and updated as indicated.   Past Medical History:  Diagnosis Date   Allergy    PCN - Welbutrin   Asthma    hx of   Depression    Diabetes mellitus without complication (HCC)    GERD (gastroesophageal reflux disease)    Herniated disc    HLD (hyperlipidemia)    Hypertension    Morbid obesity (HCC) 10/18/2021   Sleep apnea uses c-pap   Streptococcal cellulitis    Wears glasses      Past Surgical History:  Procedure Laterality Date   ANKLE SURGERY     left fx   APPENDECTOMY     CARPAL TUNNEL RELEASE Right 10/05/2014   Procedure: RIGHT CARPAL TUNNEL RELEASE;  Surgeon: Elsie Mussel, MD;  Location: [facility];  Service: Orthopedics;  Laterality: Right;   COLONOSCOPY     COLONOSCOPY N/A 12/06/2023   Procedure: COLONOSCOPY;  Surgeon: Onita Elspeth Sharper, DO;  Location: [facility]  ENDOSCOPY;  Service: Gastroenterology;  Laterality: N/A;   HERNIA REPAIR     umbilical   LUMBAR LAMINECTOMY/DECOMPRESSION MICRODISCECTOMY  07/23/2011   Procedure: LUMBAR LAMINECTOMY/DECOMPRESSION MICRODISCECTOMY;  Surgeon: Tanda DELENA Heading, MD;  Location: [facility];  Service: Orthopedics;  Laterality: Right;   POLYPECTOMY  12/06/2023   Procedure: POLYPECTOMY, INTESTINE;  Surgeon: Onita Elspeth Sharper, DO;  Location: [facility] END[facility];  Service: Gastroenterology;;   SPINE SURGERY     l -spine disc repair   ULNAR TUNNEL RELEASE Right 10/05/2014   Procedure: RIGHT CUBITAL TUNNEL RELEASE;  Surgeon: Elsie Mussel, MD;  Location:  [facility];  Service: Orthopedics;  Laterality: Right;   UMBILICAL HERNIA REPAIR N/A 10/20/2014   Procedure: HERNIA REPAIR UMBILICAL ADULT;  Surgeon: Unknown Sharps, MD;  Location: [facility];  Service: General;  Laterality: N/A;   VASECTOMY      Allergies and medications reviewed and updated.   Current Outpatient Medications:    albuterol  (VENTOLIN  HFA) 108 (90 Base) MCG/ACT inhaler, Inhale 1-2 puffs into the lungs every 6 (six) hours as needed for wheezing or shortness of breath., Disp: 18 g, Rfl: 0   atorvastatin  (LIPITOR) 10 MG tablet, Take 1 tablet (10 mg total) by mouth once daily, Disp: 90 tablet, Rfl: 3   Blood Glucose Monitoring Suppl (FREESTYLE FREEDOM LITE) w/Device KIT, Use as directed to check blood sugar 2 (two) times daily., Disp: 1  kit, Rfl: 0   canagliflozin  (INVOKANA ) 300 MG TABS tablet, Take 1 tablet (300 mg total) by mouth daily., Disp: 90 tablet, Rfl: 1   glucose blood (ACCU-CHEK GUIDE TEST) test strip, Use 2 (two) times daily., Disp: 200 strip, Rfl: 3   Lancets (FREESTYLE) lancets, Use as directed to check blood sugar 2 (two) times daily., Disp: 100 each, Rfl: 3   lisinopril  (ZESTRIL ) 20 MG tablet, Take 1 tablet (20 mg total) by mouth daily., Disp: 90 tablet, Rfl: 1   meloxicam  (MOBIC ) 15 MG tablet, Take 1 tablet (15 mg total) by mouth daily  as needed., Disp: 30 tablet, Rfl: 0   omeprazole  (PRILOSEC) 20 MG capsule, Take 1 capsule (20 mg total) by mouth daily., Disp: 90 capsule, Rfl: 1   sildenafil  (REVATIO ) 20 MG tablet, Take 3 tablets (60 mg total) by mouth daily as needed., Disp: 60 tablet, Rfl: 1  Allergies  Allergen Reactions   Penicillin G Anaphylaxis   Penicillins Anaphylaxis   Bupropion Other (See Comments)    Mental status change.  Different personality   Escitalopram Oxalate Other (See Comments)    Delayed ejaculation.    Objective:   BP 127/83   Pulse 74   Temp 97.8 F (36.6 C)   Ht 5' 10 (1.778 m)   Wt 260 lb 0.6 oz (118 kg)   SpO2 99%   BMI 37.31 kg/m      03/20/2024    8:18 AM 02/09/2024    2:14 PM 12/06/2023    1:48 PM  Vitals with BMI  Height 5' 10 5' 10   Weight 260 lbs 1 oz 261 lbs 10 oz   BMI 37.31 37.54   Systolic 127 140 891  Diastolic 83 82 86  Pulse 74 92 62     Physical Exam Vitals and nursing note reviewed.  Constitutional:      Appearance: Normal appearance. He is normal weight.  HENT:     Head: Normocephalic and atraumatic.  Cardiovascular:     Rate and Rhythm: Normal rate and regular rhythm.     Pulses: Normal pulses.     Heart sounds: Normal heart sounds.  Pulmonary:     Effort: Pulmonary effort is normal.     Breath sounds: Normal breath sounds.  Skin:    General: Skin is warm and dry.     Capillary Refill: Capillary refill takes less than 2 seconds.  Neurological:     General: No focal deficit present.     Mental Status: He is alert and oriented to person, place, and time. Mental status is at baseline.  Psychiatric:        Mood and Affect: Mood normal.        Behavior: Behavior normal.        Thought Content: Thought content normal.        Judgment: Judgment normal.     Assessment & Plan:  Essential hypertension Assessment & Plan: Continue Lisinopril  20mg  daily. Notify office if sustains >130/80 at home. Recommend heart healthy diet such as  Mediterranean diet with whole grains, fruits, vegetable, fish, lean meats, nuts, and olive oil. Limit salt. Encouraged moderate walking, 3-5 times/week for 30-50 minutes each session. Aim for at least 150 minutes.week. Goal should be pace of 3 miles/hours, or walking 1.5 miles in 30 minutes. Avoid tobacco products. Avoid excess alcohol. Take medications as prescribed and bring medications and blood pressure log with cuff to each office visit. Seek medical care for chest pain, palpitations, shortness of  breath with exertion, dizziness/lightheadedness, vision changes, recurrent headaches, or swelling of extremities. Follow up in 3 months or sooner if needed   Controlled type 2 diabetes mellitus with hyperglycemia (HCC) Assessment & Plan: A1c 7.2%. Continue Invokana  and consider GLP.  Would like to work on incorporating exercise and lifestyle changes prior to adding another medication.  Recommend heart healthy diet such as Mediterranean diet with whole grains, fruits, vegetable, fish, lean meats, nuts, and olive oil. Limit salt. Encouraged moderate walking, 3-5 times/week for 30-50 minutes each session. Aim for at least 150 minutes.week. Goal should be pace of 3 miles/hours, or walking 1.5 miles in 30 minutes. Seek medical care for urinary frequency, extreme thirst, vision changes, lightheadedness, dizziness.  Follow up recehck labs and OV in 3 months   Mixed hyperlipidemia Assessment & Plan: Continue atorvastatin . Would like to work on incorporating exercise and working on lifestyle changes prior to intensifying. I recommend consuming a heart healthy diet such as Mediterranean diet or DASH diet with whole grains, fruits, vegetable, fish, lean meats, nuts, and olive oil. Limit sweets and processed foods. I also encourage moderate intensity exercise 150 minutes weekly. This is 3-5 times weekly for 30-50 minutes each session. Goal should be pace of 3 miles/hours, or walking 1.5 miles in 30 minutes. The  10-year ASCVD risk score (Arnett DK, et al., 2019) is: 28.6% Follow up in 3 months or sooner if needed.      Follow up plan: Return in about 3 months (around 06/20/2024) for annual physical with labs 1 week prior.  Jeoffrey GORMAN Barrio, FNP

## 2024-05-31 DIAGNOSIS — G4733 Obstructive sleep apnea (adult) (pediatric): Secondary | ICD-10-CM | POA: Diagnosis not present

## 2024-06-05 ENCOUNTER — Other Ambulatory Visit: Payer: Self-pay

## 2024-06-06 ENCOUNTER — Other Ambulatory Visit: Payer: Self-pay | Admitting: Family Medicine

## 2024-06-06 ENCOUNTER — Other Ambulatory Visit: Payer: Self-pay

## 2024-06-09 ENCOUNTER — Other Ambulatory Visit: Payer: Self-pay

## 2024-06-09 ENCOUNTER — Telehealth: Payer: Self-pay

## 2024-06-09 MED FILL — Canagliflozin Tab 300 MG: ORAL | 90 days supply | Qty: 90 | Fill #0 | Status: AC

## 2024-06-09 NOTE — Telephone Encounter (Signed)
 Copied from CRM #[id number]. Topic: Clinical - Medication Question >> Jun 09, 2024 10:42 AM Emylou G wrote: Reason for CRM: Patient called.. still waiting on canagliflozin  (INVOKANA ) 300 MG TABS tablet to be filled - he is worried he did put it in, but he knows his PCP is off.. will we send in refill before he leaves out of town

## 2024-06-12 ENCOUNTER — Encounter: Payer: Self-pay | Admitting: Family Medicine

## 2024-06-12 ENCOUNTER — Other Ambulatory Visit

## 2024-06-19 ENCOUNTER — Other Ambulatory Visit: Payer: Self-pay

## 2024-06-19 ENCOUNTER — Other Ambulatory Visit

## 2024-06-19 DIAGNOSIS — Z0000 Encounter for general adult medical examination without abnormal findings: Secondary | ICD-10-CM

## 2024-06-20 ENCOUNTER — Encounter: Payer: Self-pay | Admitting: Family Medicine

## 2024-06-20 ENCOUNTER — Ambulatory Visit: Admitting: Family Medicine

## 2024-06-20 ENCOUNTER — Other Ambulatory Visit: Payer: Self-pay

## 2024-06-20 ENCOUNTER — Ambulatory Visit: Payer: Self-pay | Admitting: Family Medicine

## 2024-06-20 VITALS — BP 130/74 | HR 76 | Ht 70.0 in | Wt 262.0 lb

## 2024-06-20 DIAGNOSIS — E782 Mixed hyperlipidemia: Secondary | ICD-10-CM | POA: Diagnosis not present

## 2024-06-20 DIAGNOSIS — E1165 Type 2 diabetes mellitus with hyperglycemia: Secondary | ICD-10-CM | POA: Diagnosis not present

## 2024-06-20 DIAGNOSIS — I10 Essential (primary) hypertension: Secondary | ICD-10-CM | POA: Diagnosis not present

## 2024-06-20 DIAGNOSIS — Z23 Encounter for immunization: Secondary | ICD-10-CM | POA: Diagnosis not present

## 2024-06-20 DIAGNOSIS — G4733 Obstructive sleep apnea (adult) (pediatric): Secondary | ICD-10-CM

## 2024-06-20 DIAGNOSIS — Z7984 Long term (current) use of oral hypoglycemic drugs: Secondary | ICD-10-CM

## 2024-06-20 DIAGNOSIS — Z0001 Encounter for general adult medical examination with abnormal findings: Secondary | ICD-10-CM

## 2024-06-20 DIAGNOSIS — F17210 Nicotine dependence, cigarettes, uncomplicated: Secondary | ICD-10-CM | POA: Diagnosis not present

## 2024-06-20 DIAGNOSIS — Z0000 Encounter for general adult medical examination without abnormal findings: Secondary | ICD-10-CM | POA: Insufficient documentation

## 2024-06-20 DIAGNOSIS — E785 Hyperlipidemia, unspecified: Secondary | ICD-10-CM

## 2024-06-20 MED ORDER — TIRZEPATIDE 5 MG/0.5ML ~~LOC~~ SOAJ
5.0000 mg | SUBCUTANEOUS | 0 refills | Status: AC
  Filled 2024-06-20 – 2024-07-18 (×2): qty 2, 28d supply, fill #0

## 2024-06-20 MED ORDER — TIRZEPATIDE 2.5 MG/0.5ML ~~LOC~~ SOAJ
2.5000 mg | SUBCUTANEOUS | 0 refills | Status: AC
  Filled 2024-06-20: qty 2, 28d supply, fill #0

## 2024-06-20 NOTE — Addendum Note (Signed)
 Addended by: CORINNA JESUSA SAUNDERS on: 06/20/2024 04:36 PM   Modules accepted: Orders

## 2024-06-20 NOTE — Progress Notes (Signed)
 "  New Patient Office Visit  Patient ID: Jeffrey [name], Male   DOB: [birth year] 64 y.o. MRN: [id number]  Chief Complaint  Patient presents with   Annual Exam    CPE Needs to have all meds refilled. And for you to take responsibility for all meds.  Needs refilled today : lisinopril , omeprazole  refilled today    Subjective:     Jeffrey [name] presents to establish care. PMH includes HTN, DM2, HLD, GERD, Obesity, OSA, allergies, asthma, depression, thrombocytopenia.  Asthma: PRN albuterol   DM: taking invokana  300mg  daily, improved diet Denies polyuria, polydypsia, chest pain, paresthesias, or chest pain. Lab Results  Component Value Date   HGBA1C 6.7 (H) 06/19/2024   HGBA1C 7.2 (H) 03/16/2024   HGBA1C 6.5 (H) 10/18/2021    HLD: Atorvastatin  10mg  daily, no chest pain Lipid Panel     Component Value Date/Time   CHOL 158 06/19/2024 1024   TRIG 84 06/19/2024 1024   HDL 45 06/19/2024 1024   CHOLHDL 3.5 06/19/2024 1024   LDLCALC 96 06/19/2024 1024    HTN: well controlled on lisinopril  20mg  daily denies chest pain, palpitations, recurrent headaches, vision changes, lightheadedness, dizziness, dyspnea on exertion, or swelling of extremities.   GERD: on Omeprazole  20mg  daily Denies hematemesis, melena, hematochezia, occult blood in stool, iron deficiency anemia, anorexia, unexplained weight loss, dysphagia, odynophagia, persistent vomiting   Low testosterone : natural supplement   Cigarette nicotine  dependence: 0.5ppd, has tried wellbutrin and chantix with side effects   OSA: on CPAP   HPI  Discussed the use of AI scribe software for clinical note transcription with the patient, who gave verbal consent to proceed.  History of Present Illness Jeffrey [name] is a 64 year old male with type 2 diabetes and chronic kidney disease stage 2 who presents for a follow-up visit.  His A1c has improved to 6.7 from 7.2, attributed to better dietary habits despite  occasional indiscretions. He has been on Invokana  for at least four years, which helps manage blood sugar levels but causes frequent urination and thirst. He also takes lisinopril  20 mg for blood pressure.  He has a history of thrombocytopenia with low-normal platelet counts but no significant bleeding issues, although minor cuts take a few minutes to stop bleeding. His mother also had low platelets and was anemic, suggesting a hereditary component. No liver issues are reported.  He has a past history of low testosterone , diagnosed several years ago due to significant fatigue. Lifestyle improvements with dietary changes and increased exercise, along with a natural testosterone  booster supplement, have helped maintain energy levels.  He uses a CPAP machine regularly for sleep apnea and reports good compliance. Albuterol  is used occasionally for respiratory infections but not regularly.  He takes omeprazole  for GERD, with symptoms well-controlled as long as he takes the medication. No difficulty swallowing, pain, or gastrointestinal bleeding. He has a history of chemotherapy and used to take a baby aspirin daily during that time. He currently does not take aspirin or other anticoagulants.  His weight has been stable over the past three years. He engages in physical activities such as playing golf and working on his tractor. He has reduced his food intake by 50-75% compared to previous levels. He rarely consumes alcohol and denies illicit drug use.   Outpatient Encounter Medications as of 06/20/2024  Medication Sig   albuterol  (VENTOLIN  HFA) 108 (90 Base) MCG/ACT inhaler Inhale 1-2 puffs into the lungs every 6 (six) hours as needed for wheezing or shortness of  breath.   atorvastatin  (LIPITOR) 10 MG tablet Take 1 tablet (10 mg total) by mouth once daily   Blood Glucose Monitoring Suppl (FREESTYLE FREEDOM LITE) w/Device KIT Use as directed to check blood sugar 2 (two) times daily.   canagliflozin   (INVOKANA ) 300 MG TABS tablet Take 1 tablet (300 mg total) by mouth daily.   glucose blood (ACCU-CHEK GUIDE TEST) test strip Use 2 (two) times daily.   Lancets (FREESTYLE) lancets Use as directed to check blood sugar 2 (two) times daily.   lisinopril  (ZESTRIL ) 20 MG tablet Take 1 tablet (20 mg total) by mouth daily.   meloxicam  (MOBIC ) 15 MG tablet Take 1 tablet (15 mg total) by mouth daily as needed.   omeprazole  (PRILOSEC) 20 MG capsule Take 1 capsule (20 mg total) by mouth daily.   sildenafil  (REVATIO ) 20 MG tablet Take 3 tablets (60 mg total) by mouth daily as needed.   tirzepatide  (MOUNJARO ) 2.5 MG/0.5ML Pen Inject 2.5 mg into the skin once a week for 28 days.   [START ON 07/19/2024] tirzepatide  (MOUNJARO ) 5 MG/0.5ML Pen Inject 5 mg into the skin once a week.   ascorbic acid (VITAMIN C) 500 MG tablet Take 500 mg by mouth daily.   magnesium oxide (MAG-OX) 400 MG tablet Take 1 tablet by mouth daily.   zinc gluconate 50 MG tablet Take 50 mg by mouth daily.   No facility-administered encounter medications on file as of 06/20/2024.    Past Medical History:  Diagnosis Date   Allergy    PCN - Welbutrin   Asthma    hx of   Depression    Diabetes mellitus without complication (HCC)    GERD (gastroesophageal reflux disease)    Herniated disc    HLD (hyperlipidemia)    Hypertension    Morbid obesity (HCC) 10/18/2021   Sleep apnea uses c-pap   Streptococcal cellulitis    Wears glasses     Past Surgical History:  Procedure Laterality Date   ANKLE SURGERY     left fx   APPENDECTOMY     CARPAL TUNNEL RELEASE Right 10/05/2014   Procedure: RIGHT CARPAL TUNNEL RELEASE;  Surgeon: Elsie Mussel, MD;  Location: [facility];  Service: Orthopedics;  Laterality: Right;   COLONOSCOPY     COLONOSCOPY N/A 12/06/2023   Procedure: COLONOSCOPY;  Surgeon: Onita Elspeth Sharper, DO;  Location[facility];  Service: Gastroenterology;  Laterality: N/A;   HERNIA REPAIR     umbilical    LUMBAR LAMINECTOMY/DECOMPRESSION MICRODISCECTOMY  07/23/2011   Procedure: LUMBAR LAMINECTOMY/DECOMPRESSION MICRODISCECTOMY;  Surgeon: Tanda DELENA Heading, MD;  Location: [facility];  Service: Orthopedics;  Laterality: Right;   POLYPECTOMY  12/06/2023   Procedure: POLYPECTOMY, INTESTINE;  Surgeon: Onita Elspeth Sharper, DO;  Location: [facility] ENDOSCOPY;  Service: Gastroenterology;;   SPINE SURGERY     l -spine disc repair   ULNAR TUNNEL RELEASE Right 10/05/2014   Procedure: RIGHT CUBITAL TUNNEL RELEASE;  Surgeon: Elsie Mussel, MD;  Location: [facility];  Service: Orthopedics;  Laterality: Right;   UMBILICAL HERNIA REPAIR N/A 10/20/2014   Procedure: HERNIA REPAIR UMBILICAL ADULT;  Surgeon: Unknown Sharps, MD;  Location: [facility];  Service: General;  Laterality: N/A;   VASECTOMY      Family History  Problem Relation Age of Onset   Diabetes Mother    Arthritis Mother    Asthma Mother    Hearing loss Mother    Hypertension Father    Hyperlipidemia Father     Social History  Socioeconomic History   Marital status: Married    Spouse name: Not on file   Number of children: Not on file   Years of education: Not on file   Highest education level: Associate degree: occupational, scientist, product/process development, or vocational program  Occupational History   Not on file  Tobacco Use   Smoking status: Every Day    Current packs/day: 0.25    Average packs/day: 0.3 packs/day for 52.4 years (17.5 ttl pk-yrs)    Types: Cigarettes   Smokeless tobacco: Never  Vaping Use   Vaping status: Never Used  Substance and Sexual Activity   Alcohol use: Not Currently    Alcohol/week: 2.0 - 3.0 standard drinks of alcohol    Comment: rare occasion 1-2 drinks maybe every 6-8 months   Drug use: Never   Sexual activity: Not Currently    Birth control/protection: None    Comment: perimenopausal spouse  Other Topics Concern   Not on file  Social History Narrative   Not on file   Social Drivers of Health   Tobacco  Use: High Risk (06/20/2024)   Patient History    Smoking Tobacco Use: Every Day    Smokeless Tobacco Use: Never    Passive Exposure: Not on file  Financial Resource Strain: Low Risk (06/19/2024)   Overall Financial Resource Strain (CARDIA)    Difficulty of Paying Living Expenses: Not hard at all  Food Insecurity: No Food Insecurity (06/19/2024)   Epic    Worried About Radiation Protection Practitioner of Food in the Last Year: Never true    Ran Out of Food in the Last Year: Never true  Transportation Needs: No Transportation Needs (06/19/2024)   Epic    Lack of Transportation (Medical): No    Lack of Transportation (Non-Medical): No  Physical Activity: Insufficiently Active (06/19/2024)   Exercise Vital Sign    Days of Exercise per Week: 3 days    Minutes of Exercise per Session: 40 min  Stress: No Stress Concern Present (06/19/2024)   [employer] of Occupational Health - Occupational Stress Questionnaire    Feeling of Stress: Only a little  Social Connections: Moderately Integrated (06/19/2024)   Social Connection and Isolation Panel    Frequency of Communication with Friends and Family: More than three times a week    Frequency of Social Gatherings with Friends and Family: More than three times a week    Attends Religious Services: More than 4 times per year    Active Member of Clubs or Organizations: No    Attends Engineer, Structural: Not on file    Marital Status: Married  Catering Manager Violence: Not on file  Depression (PHQ2-9): Low Risk (03/20/2024)   Depression (PHQ2-9)    PHQ-2 Score: 0  Alcohol Screen: Low Risk (06/19/2024)   Alcohol Screen    Last Alcohol Screening Score (AUDIT): 1  Housing: Low Risk (06/19/2024)   Epic    Unable to Pay for Housing in the Last Year: No    Number of Times Moved in the Last Year: 0    Homeless in the Last Year: No  Utilities: Not At Risk (05/31/2023)   Received from [facility] Utilities    Threatened with loss of utilities: No   Health Literacy: Not on file    Review of Systems  Constitutional: Negative.   HENT: Negative.    Eyes: Negative.   Respiratory: Negative.    Cardiovascular: Negative.   Gastrointestinal: Negative.   Genitourinary: Negative.  Musculoskeletal: Negative.   Skin: Negative.   Neurological: Negative.   Endo/Heme/Allergies: Negative.   Psychiatric/Behavioral: Negative.    All other systems reviewed and are negative.     Objective:    BP 130/74   Pulse 76   Ht 5' 10 (1.778 m)   Wt 262 lb (118.8 kg)   SpO2 96%   BMI 37.59 kg/m     06/20/2024    2:57 PM 03/20/2024    8:18 AM 02/09/2024    2:14 PM  Vitals with BMI  Height 5' 10 5' 10 5' 10  Weight 262 lbs 260 lbs 1 oz 261 lbs 10 oz  BMI 37.59 37.31 37.54  Systolic 130 127 859  Diastolic 74 83 82  Pulse 76 74 92     Physical Exam Vitals and nursing note reviewed.  Constitutional:      Appearance: Normal appearance. He is obese.  HENT:     Head: Normocephalic and atraumatic.     Right Ear: Tympanic membrane, ear canal and external ear normal.     Left Ear: Tympanic membrane, ear canal and external ear normal.     Nose: Nose normal.     Mouth/Throat:     Mouth: Mucous membranes are moist.     Pharynx: Oropharynx is clear.  Eyes:     Extraocular Movements: Extraocular movements intact.     Right eye: Normal extraocular motion and no nystagmus.     Left eye: Normal extraocular motion and no nystagmus.     Conjunctiva/sclera: Conjunctivae normal.     Pupils: Pupils are equal, round, and reactive to light.  Cardiovascular:     Rate and Rhythm: Normal rate and regular rhythm.     Pulses: Normal pulses.     Heart sounds: Normal heart sounds.  Pulmonary:     Effort: Pulmonary effort is normal.     Breath sounds: Normal breath sounds.  Abdominal:     General: Bowel sounds are normal.     Palpations: Abdomen is soft.  Genitourinary:    Comments: Deferred using shared decision making Musculoskeletal:         General: Normal range of motion.     Cervical back: Normal range of motion and neck supple.  Skin:    General: Skin is warm and dry.     Capillary Refill: Capillary refill takes less than 2 seconds.  Neurological:     General: No focal deficit present.     Mental Status: He is alert. Mental status is at baseline.  Psychiatric:        Mood and Affect: Mood normal.        Speech: Speech normal.        Behavior: Behavior normal.        Thought Content: Thought content normal.        Cognition and Memory: Cognition and memory normal.        Judgment: Judgment normal.      Last CBC Lab Results  Component Value Date   WBC 6.8 06/19/2024   HGB 16.6 06/19/2024   HCT 51.0 06/19/2024   MCV 93.1 06/19/2024   MCH 30.3 06/19/2024   RDW 12.7 06/19/2024   PLT 110 (L) 06/19/2024   Last metabolic panel Lab Results  Component Value Date   GLUCOSE 114 (H) 06/19/2024   NA 139 06/19/2024   K 4.4 06/19/2024   CL 103 06/19/2024   CO2 25 06/19/2024   BUN 17 06/19/2024   CREATININE 0.97 06/19/2024  EGFR 88 06/19/2024   CALCIUM  9.1 06/19/2024   PROT 6.5 06/19/2024   ALBUMIN 2.8 (L) 10/20/2021   BILITOT 0.5 06/19/2024   ALKPHOS 98 10/20/2021   AST 19 06/19/2024   ALT 25 06/19/2024   ANIONGAP 3 (L) 10/22/2021   Last lipids Lab Results  Component Value Date   CHOL 158 06/19/2024   HDL 45 06/19/2024   LDLCALC 96 06/19/2024   TRIG 84 06/19/2024   CHOLHDL 3.5 06/19/2024   Last hemoglobin A1c Lab Results  Component Value Date   HGBA1C 6.7 (H) 06/19/2024   Last thyroid  functions Lab Results  Component Value Date   TSH 1.60 06/19/2024   Last vitamin D  Lab Results  Component Value Date   VD25OH 41 03/16/2024   Last vitamin B12 and Folate No results found for: VITAMINB12, FOLATE      Assessment & Plan:   Problem List Items Addressed This Visit       Cardiovascular and Mediastinum   Essential hypertension     Respiratory   OSA on CPAP     Endocrine    Controlled type 2 diabetes mellitus with hyperglycemia (HCC)   Relevant Medications   tirzepatide  (MOUNJARO ) 2.5 MG/0.5ML Pen   tirzepatide  (MOUNJARO ) 5 MG/0.5ML Pen (Start on 07/19/2024)     Other   HLD (hyperlipidemia)   Morbid obesity (HCC)   Relevant Medications   magnesium oxide (MAG-OX) 400 MG tablet   tirzepatide  (MOUNJARO ) 2.5 MG/0.5ML Pen   tirzepatide  (MOUNJARO ) 5 MG/0.5ML Pen (Start on 07/19/2024)   Cigarette nicotine  dependence without complication   Physical exam, annual - Primary    Assessment and Plan Assessment & Plan Type 2 diabetes mellitus with hyperglycemia A1c improved to 6.7. Discussed switching to Mounjaro  for additional benefits. No contraindications noted. - Initiated Mounjaro  at 2.5 mg once weekly for 4 weeks, then increase to 5 mg, and eventually to 7.5 mg. - Recheck A1c in 3 months. - Monitor for hypoglycemia and adjust Invokana  if necessary.  Morbid obesity Weight stable for three years. Mounjaro  may aid in weight management. - Initiated Mounjaro  as per diabetes management plan.  Essential hypertension Blood pressure well-controlled on Lisinopril . - Continue Lisinopril  20 mg daily.  Mixed hyperlipidemia Cholesterol levels well-controlled with current regimen. - Continue current lipid-lowering therapy.  Chronic thrombocytopenia Platelets low normal. No bleeding history. Discussed potential causes and monitoring preference. - Ordered B12, folate, copper, hepatitis C, and HIV tests. - Monitor for signs of bleeding or bruising. - Declines hematology referral  Obstructive sleep apnea Uses CPAP regularly. - Continue CPAP therapy.  Nicotine  dependence, cigarettes Reduced smoking but continues. - Encouraged smoking cessation.  Gastroesophageal reflux disease Symptoms well-controlled with Omeprazole . - Continue Omeprazole  20 mg daily.  General Health Maintenance Due for tetanus booster. Recent colonoscopy normal. - Administered tetanus  booster. - Continue routine health maintenance and screenings. - Foot exam today.    Return in about 3 months (around 09/18/2024) for chronic follow-up with labs 1 week prior.   Jeoffrey GORMAN Barrio, FNP [facility]     "

## 2024-06-22 ENCOUNTER — Other Ambulatory Visit: Payer: Self-pay | Admitting: Family Medicine

## 2024-06-22 ENCOUNTER — Encounter: Payer: Self-pay | Admitting: Pharmacy Technician

## 2024-06-22 ENCOUNTER — Other Ambulatory Visit: Payer: Self-pay

## 2024-06-22 LAB — CBC WITH DIFFERENTIAL/PLATELET
Absolute Lymphocytes: 2434 {cells}/uL (ref 850–3900)
Absolute Monocytes: 564 {cells}/uL (ref 200–950)
Basophils Absolute: 48 {cells}/uL (ref 0–200)
Basophils Relative: 0.7 %
Eosinophils Absolute: 150 {cells}/uL (ref 15–500)
Eosinophils Relative: 2.2 %
HCT: 51 % (ref 39.4–51.1)
Hemoglobin: 16.6 g/dL (ref 13.2–17.1)
MCH: 30.3 pg (ref 27.0–33.0)
MCHC: 32.5 g/dL (ref 31.6–35.4)
MCV: 93.1 fL (ref 81.4–101.7)
MPV: 10.8 fL (ref 7.5–12.5)
Monocytes Relative: 8.3 %
Neutro Abs: 3604 {cells}/uL (ref 1500–7800)
Neutrophils Relative %: 53 %
Platelets: 110 Thousand/uL — ABNORMAL LOW (ref 140–400)
RBC: 5.48 Million/uL (ref 4.20–5.80)
RDW: 12.7 % (ref 11.0–15.0)
Total Lymphocyte: 35.8 %
WBC: 6.8 Thousand/uL (ref 3.8–10.8)

## 2024-06-22 LAB — LIPID PANEL
Cholesterol: 158 mg/dL
HDL: 45 mg/dL
LDL Cholesterol (Calc): 96 mg/dL
Non-HDL Cholesterol (Calc): 113 mg/dL
Total CHOL/HDL Ratio: 3.5 (calc)
Triglycerides: 84 mg/dL

## 2024-06-22 LAB — COMPREHENSIVE METABOLIC PANEL WITH GFR
AG Ratio: 1.6 (calc) (ref 1.0–2.5)
ALT: 25 U/L (ref 9–46)
AST: 19 U/L (ref 10–35)
Albumin: 4 g/dL (ref 3.6–5.1)
Alkaline phosphatase (APISO): 62 U/L (ref 35–144)
BUN: 17 mg/dL (ref 7–25)
CO2: 25 mmol/L (ref 20–32)
Calcium: 9.1 mg/dL (ref 8.6–10.3)
Chloride: 103 mmol/L (ref 98–110)
Creat: 0.97 mg/dL (ref 0.70–1.35)
Globulin: 2.5 g/dL (ref 1.9–3.7)
Glucose, Bld: 114 mg/dL — ABNORMAL HIGH (ref 65–99)
Potassium: 4.4 mmol/L (ref 3.5–5.3)
Sodium: 139 mmol/L (ref 135–146)
Total Bilirubin: 0.5 mg/dL (ref 0.2–1.2)
Total Protein: 6.5 g/dL (ref 6.1–8.1)
eGFR: 88 mL/min/1.73m2

## 2024-06-22 LAB — HEP C AB W/REFL: HEPATITIS C ANTIBODY REFILL$(REFL): NONREACTIVE

## 2024-06-22 LAB — PSA: PSA: 2.3 ng/mL

## 2024-06-22 LAB — HEMOGLOBIN A1C
Hgb A1c MFr Bld: 6.7 % — ABNORMAL HIGH
Mean Plasma Glucose: 146 mg/dL
eAG (mmol/L): 8.1 mmol/L

## 2024-06-22 LAB — TSH: TSH: 1.6 m[IU]/L (ref 0.40–4.50)

## 2024-06-22 LAB — HIV ANTIBODY (ROUTINE TESTING W REFLEX)
HIV 1&2 Ab, 4th Generation: NONREACTIVE
HIV FINAL INTERPRETATION: NEGATIVE

## 2024-06-22 LAB — B12 AND FOLATE PANEL
Folate: 9.7 ng/mL
Vitamin B-12: 367 pg/mL (ref 200–1100)

## 2024-06-22 LAB — TEST AUTHORIZATION

## 2024-06-22 LAB — REFLEX TIQ

## 2024-06-22 MED ORDER — OMEPRAZOLE 20 MG PO CPDR
20.0000 mg | DELAYED_RELEASE_CAPSULE | Freq: Every day | ORAL | 1 refills | Status: AC
  Filled 2024-06-22: qty 90, 90d supply, fill #0

## 2024-06-22 MED ORDER — LISINOPRIL 20 MG PO TABS
20.0000 mg | ORAL_TABLET | Freq: Every day | ORAL | 1 refills | Status: AC
  Filled 2024-06-22: qty 90, 90d supply, fill #0

## 2024-06-26 ENCOUNTER — Other Ambulatory Visit: Payer: Self-pay

## 2024-07-13 ENCOUNTER — Encounter: Payer: Self-pay | Admitting: Family Medicine

## 2024-07-14 ENCOUNTER — Other Ambulatory Visit: Payer: Self-pay

## 2024-07-18 ENCOUNTER — Other Ambulatory Visit: Payer: Self-pay

## 2024-07-18 ENCOUNTER — Other Ambulatory Visit (HOSPITAL_COMMUNITY): Payer: Self-pay

## 2024-07-19 ENCOUNTER — Other Ambulatory Visit: Payer: Self-pay

## 2024-09-19 ENCOUNTER — Ambulatory Visit: Admitting: Family Medicine

## 2025-06-18 ENCOUNTER — Other Ambulatory Visit

## 2025-06-25 ENCOUNTER — Encounter: Admitting: Family Medicine
